# Patient Record
Sex: Female | Born: 1975 | Race: White | Hispanic: No | Marital: Married | Smoking: Current every day smoker
Health system: Southern US, Community
[De-identification: ages and names within clinical notes are randomized; demographics above are authoritative.]

## PROBLEM LIST (undated history)

## (undated) DIAGNOSIS — M069 Rheumatoid arthritis, unspecified: Secondary | ICD-10-CM

## (undated) DIAGNOSIS — F32A Depression, unspecified: Secondary | ICD-10-CM

## (undated) DIAGNOSIS — M8580 Other specified disorders of bone density and structure, unspecified site: Secondary | ICD-10-CM

## (undated) DIAGNOSIS — Z87442 Personal history of urinary calculi: Secondary | ICD-10-CM

## (undated) DIAGNOSIS — K5792 Diverticulitis of intestine, part unspecified, without perforation or abscess without bleeding: Secondary | ICD-10-CM

## (undated) DIAGNOSIS — J9610 Chronic respiratory failure, unspecified whether with hypoxia or hypercapnia: Secondary | ICD-10-CM

## (undated) DIAGNOSIS — F319 Bipolar disorder, unspecified: Secondary | ICD-10-CM

## (undated) DIAGNOSIS — Z9981 Dependence on supplemental oxygen: Secondary | ICD-10-CM

## (undated) DIAGNOSIS — J449 Chronic obstructive pulmonary disease, unspecified: Secondary | ICD-10-CM

## (undated) DIAGNOSIS — K760 Fatty (change of) liver, not elsewhere classified: Secondary | ICD-10-CM

## (undated) DIAGNOSIS — K219 Gastro-esophageal reflux disease without esophagitis: Secondary | ICD-10-CM

## (undated) DIAGNOSIS — F419 Anxiety disorder, unspecified: Secondary | ICD-10-CM

## (undated) DIAGNOSIS — E119 Type 2 diabetes mellitus without complications: Secondary | ICD-10-CM

## (undated) DIAGNOSIS — K567 Ileus, unspecified: Secondary | ICD-10-CM

## (undated) DIAGNOSIS — E785 Hyperlipidemia, unspecified: Secondary | ICD-10-CM

## (undated) DIAGNOSIS — F329 Major depressive disorder, single episode, unspecified: Secondary | ICD-10-CM

## (undated) DIAGNOSIS — R0600 Dyspnea, unspecified: Secondary | ICD-10-CM

## (undated) DIAGNOSIS — R945 Abnormal results of liver function studies: Secondary | ICD-10-CM

## (undated) DIAGNOSIS — I10 Essential (primary) hypertension: Secondary | ICD-10-CM

## (undated) DIAGNOSIS — M797 Fibromyalgia: Secondary | ICD-10-CM

## (undated) DIAGNOSIS — R7989 Other specified abnormal findings of blood chemistry: Secondary | ICD-10-CM

## (undated) DIAGNOSIS — C539 Malignant neoplasm of cervix uteri, unspecified: Secondary | ICD-10-CM

## (undated) HISTORY — DX: Chronic respiratory failure, unspecified whether with hypoxia or hypercapnia: J96.10

## (undated) HISTORY — DX: Rheumatoid arthritis, unspecified: M06.9

## (undated) HISTORY — DX: Ileus, unspecified: K56.7

## (undated) HISTORY — DX: Depression, unspecified: F32.A

## (undated) HISTORY — DX: Fatty (change of) liver, not elsewhere classified: K76.0

## (undated) HISTORY — DX: Anxiety disorder, unspecified: F41.9

## (undated) HISTORY — DX: Abnormal results of liver function studies: R94.5

## (undated) HISTORY — DX: Diverticulitis of intestine, part unspecified, without perforation or abscess without bleeding: K57.92

## (undated) HISTORY — DX: Other specified abnormal findings of blood chemistry: R79.89

## (undated) HISTORY — DX: Dependence on supplemental oxygen: Z99.81

## (undated) HISTORY — DX: Gastro-esophageal reflux disease without esophagitis: K21.9

## (undated) HISTORY — DX: Hyperlipidemia, unspecified: E78.5

## (undated) HISTORY — DX: Fibromyalgia: M79.7

## (undated) HISTORY — DX: Bipolar disorder, unspecified: F31.9

## (undated) HISTORY — PX: BREAST LUMPECTOMY: SHX2

## (undated) HISTORY — PX: APPENDECTOMY: SHX54

## (undated) HISTORY — DX: Major depressive disorder, single episode, unspecified: F32.9

## (undated) HISTORY — PX: TOTAL ABDOMINAL HYSTERECTOMY W/ BILATERAL SALPINGOOPHORECTOMY: SHX83

## (undated) HISTORY — PX: CHOLECYSTECTOMY: SHX55

---

## 2013-09-29 DIAGNOSIS — F489 Nonpsychotic mental disorder, unspecified: Secondary | ICD-10-CM | POA: Diagnosis present

## 2013-09-29 DIAGNOSIS — Z8542 Personal history of malignant neoplasm of other parts of uterus: Secondary | ICD-10-CM | POA: Diagnosis not present

## 2013-09-29 DIAGNOSIS — F17200 Nicotine dependence, unspecified, uncomplicated: Secondary | ICD-10-CM | POA: Diagnosis present

## 2013-09-29 DIAGNOSIS — F331 Major depressive disorder, recurrent, moderate: Secondary | ICD-10-CM | POA: Diagnosis not present

## 2013-09-29 DIAGNOSIS — M1990 Unspecified osteoarthritis, unspecified site: Secondary | ICD-10-CM | POA: Diagnosis present

## 2013-09-29 DIAGNOSIS — Z8541 Personal history of malignant neoplasm of cervix uteri: Secondary | ICD-10-CM | POA: Diagnosis not present

## 2013-09-29 DIAGNOSIS — F329 Major depressive disorder, single episode, unspecified: Secondary | ICD-10-CM | POA: Diagnosis not present

## 2013-09-29 DIAGNOSIS — F3110 Bipolar disorder, current episode manic without psychotic features, unspecified: Secondary | ICD-10-CM | POA: Diagnosis present

## 2013-09-29 DIAGNOSIS — IMO0001 Reserved for inherently not codable concepts without codable children: Secondary | ICD-10-CM | POA: Diagnosis present

## 2013-09-29 DIAGNOSIS — M069 Rheumatoid arthritis, unspecified: Secondary | ICD-10-CM | POA: Diagnosis present

## 2013-09-29 DIAGNOSIS — Z853 Personal history of malignant neoplasm of breast: Secondary | ICD-10-CM | POA: Diagnosis not present

## 2013-12-13 DIAGNOSIS — E039 Hypothyroidism, unspecified: Secondary | ICD-10-CM | POA: Diagnosis not present

## 2013-12-13 DIAGNOSIS — E559 Vitamin D deficiency, unspecified: Secondary | ICD-10-CM | POA: Diagnosis not present

## 2013-12-13 DIAGNOSIS — M2550 Pain in unspecified joint: Secondary | ICD-10-CM | POA: Diagnosis not present

## 2013-12-13 DIAGNOSIS — M129 Arthropathy, unspecified: Secondary | ICD-10-CM | POA: Diagnosis not present

## 2013-12-13 DIAGNOSIS — R5381 Other malaise: Secondary | ICD-10-CM | POA: Diagnosis not present

## 2014-02-05 DIAGNOSIS — G4730 Sleep apnea, unspecified: Secondary | ICD-10-CM | POA: Diagnosis not present

## 2014-02-05 DIAGNOSIS — E559 Vitamin D deficiency, unspecified: Secondary | ICD-10-CM | POA: Diagnosis not present

## 2014-02-05 DIAGNOSIS — M069 Rheumatoid arthritis, unspecified: Secondary | ICD-10-CM | POA: Diagnosis not present

## 2014-02-05 DIAGNOSIS — G5600 Carpal tunnel syndrome, unspecified upper limb: Secondary | ICD-10-CM | POA: Diagnosis not present

## 2014-02-05 DIAGNOSIS — R7301 Impaired fasting glucose: Secondary | ICD-10-CM | POA: Diagnosis not present

## 2014-02-05 DIAGNOSIS — F3130 Bipolar disorder, current episode depressed, mild or moderate severity, unspecified: Secondary | ICD-10-CM | POA: Diagnosis not present

## 2014-02-05 DIAGNOSIS — R209 Unspecified disturbances of skin sensation: Secondary | ICD-10-CM | POA: Diagnosis not present

## 2014-02-05 DIAGNOSIS — R5381 Other malaise: Secondary | ICD-10-CM | POA: Diagnosis not present

## 2014-02-05 DIAGNOSIS — E7800 Pure hypercholesterolemia, unspecified: Secondary | ICD-10-CM | POA: Diagnosis not present

## 2014-02-05 DIAGNOSIS — G43109 Migraine with aura, not intractable, without status migrainosus: Secondary | ICD-10-CM | POA: Diagnosis not present

## 2014-02-05 DIAGNOSIS — R7309 Other abnormal glucose: Secondary | ICD-10-CM | POA: Diagnosis not present

## 2014-02-05 DIAGNOSIS — E782 Mixed hyperlipidemia: Secondary | ICD-10-CM | POA: Diagnosis not present

## 2014-02-05 DIAGNOSIS — R5383 Other fatigue: Secondary | ICD-10-CM | POA: Diagnosis not present

## 2014-02-25 DIAGNOSIS — Z853 Personal history of malignant neoplasm of breast: Secondary | ICD-10-CM | POA: Diagnosis not present

## 2014-02-25 DIAGNOSIS — F319 Bipolar disorder, unspecified: Secondary | ICD-10-CM | POA: Diagnosis not present

## 2014-02-25 DIAGNOSIS — IMO0001 Reserved for inherently not codable concepts without codable children: Secondary | ICD-10-CM | POA: Diagnosis not present

## 2014-02-25 DIAGNOSIS — S79929A Unspecified injury of unspecified thigh, initial encounter: Secondary | ICD-10-CM | POA: Diagnosis not present

## 2014-02-25 DIAGNOSIS — S79919A Unspecified injury of unspecified hip, initial encounter: Secondary | ICD-10-CM | POA: Diagnosis not present

## 2014-02-25 DIAGNOSIS — F17200 Nicotine dependence, unspecified, uncomplicated: Secondary | ICD-10-CM | POA: Diagnosis not present

## 2014-02-25 DIAGNOSIS — Z79899 Other long term (current) drug therapy: Secondary | ICD-10-CM | POA: Diagnosis not present

## 2014-02-25 DIAGNOSIS — S7000XA Contusion of unspecified hip, initial encounter: Secondary | ICD-10-CM | POA: Diagnosis not present

## 2014-03-03 DIAGNOSIS — Z791 Long term (current) use of non-steroidal anti-inflammatories (NSAID): Secondary | ICD-10-CM | POA: Diagnosis not present

## 2014-03-03 DIAGNOSIS — T781XXA Other adverse food reactions, not elsewhere classified, initial encounter: Secondary | ICD-10-CM | POA: Diagnosis not present

## 2014-03-03 DIAGNOSIS — Z79899 Other long term (current) drug therapy: Secondary | ICD-10-CM | POA: Diagnosis not present

## 2014-03-03 DIAGNOSIS — R195 Other fecal abnormalities: Secondary | ICD-10-CM | POA: Diagnosis not present

## 2014-03-03 DIAGNOSIS — IMO0001 Reserved for inherently not codable concepts without codable children: Secondary | ICD-10-CM | POA: Diagnosis not present

## 2014-03-03 DIAGNOSIS — F17200 Nicotine dependence, unspecified, uncomplicated: Secondary | ICD-10-CM | POA: Diagnosis not present

## 2014-03-03 DIAGNOSIS — M069 Rheumatoid arthritis, unspecified: Secondary | ICD-10-CM | POA: Diagnosis not present

## 2014-03-03 DIAGNOSIS — F319 Bipolar disorder, unspecified: Secondary | ICD-10-CM | POA: Diagnosis not present

## 2014-04-24 DIAGNOSIS — S0085XA Superficial foreign body of other part of head, initial encounter: Secondary | ICD-10-CM | POA: Diagnosis not present

## 2014-04-24 DIAGNOSIS — M069 Rheumatoid arthritis, unspecified: Secondary | ICD-10-CM | POA: Diagnosis not present

## 2014-04-24 DIAGNOSIS — G4730 Sleep apnea, unspecified: Secondary | ICD-10-CM | POA: Diagnosis not present

## 2014-04-24 DIAGNOSIS — S0005XA Superficial foreign body of scalp, initial encounter: Secondary | ICD-10-CM | POA: Diagnosis not present

## 2014-04-24 DIAGNOSIS — G43109 Migraine with aura, not intractable, without status migrainosus: Secondary | ICD-10-CM | POA: Diagnosis not present

## 2014-04-24 DIAGNOSIS — IMO0001 Reserved for inherently not codable concepts without codable children: Secondary | ICD-10-CM | POA: Diagnosis not present

## 2014-04-24 DIAGNOSIS — F3130 Bipolar disorder, current episode depressed, mild or moderate severity, unspecified: Secondary | ICD-10-CM | POA: Diagnosis not present

## 2014-04-24 DIAGNOSIS — G5600 Carpal tunnel syndrome, unspecified upper limb: Secondary | ICD-10-CM | POA: Diagnosis not present

## 2014-04-24 DIAGNOSIS — K219 Gastro-esophageal reflux disease without esophagitis: Secondary | ICD-10-CM | POA: Diagnosis not present

## 2014-04-24 DIAGNOSIS — Z23 Encounter for immunization: Secondary | ICD-10-CM | POA: Diagnosis not present

## 2014-05-20 DIAGNOSIS — K1370 Unspecified lesions of oral mucosa: Secondary | ICD-10-CM | POA: Diagnosis not present

## 2014-06-05 DIAGNOSIS — R42 Dizziness and giddiness: Secondary | ICD-10-CM | POA: Diagnosis not present

## 2014-06-05 DIAGNOSIS — R0682 Tachypnea, not elsewhere classified: Secondary | ICD-10-CM | POA: Diagnosis not present

## 2014-06-05 DIAGNOSIS — R404 Transient alteration of awareness: Secondary | ICD-10-CM | POA: Diagnosis not present

## 2014-06-16 DIAGNOSIS — F3160 Bipolar disorder, current episode mixed, unspecified: Secondary | ICD-10-CM | POA: Diagnosis not present

## 2014-06-16 DIAGNOSIS — F319 Bipolar disorder, unspecified: Secondary | ICD-10-CM | POA: Diagnosis not present

## 2014-06-16 DIAGNOSIS — IMO0001 Reserved for inherently not codable concepts without codable children: Secondary | ICD-10-CM | POA: Diagnosis not present

## 2014-06-16 DIAGNOSIS — F489 Nonpsychotic mental disorder, unspecified: Secondary | ICD-10-CM | POA: Diagnosis not present

## 2014-06-16 DIAGNOSIS — F329 Major depressive disorder, single episode, unspecified: Secondary | ICD-10-CM | POA: Diagnosis not present

## 2014-06-16 DIAGNOSIS — R45851 Suicidal ideations: Secondary | ICD-10-CM | POA: Diagnosis not present

## 2014-06-16 DIAGNOSIS — M069 Rheumatoid arthritis, unspecified: Secondary | ICD-10-CM | POA: Diagnosis not present

## 2014-06-16 DIAGNOSIS — Z008 Encounter for other general examination: Secondary | ICD-10-CM | POA: Diagnosis not present

## 2014-06-20 DIAGNOSIS — F4310 Post-traumatic stress disorder, unspecified: Secondary | ICD-10-CM | POA: Diagnosis not present

## 2014-06-23 DIAGNOSIS — F319 Bipolar disorder, unspecified: Secondary | ICD-10-CM | POA: Diagnosis not present

## 2014-07-03 DIAGNOSIS — R7309 Other abnormal glucose: Secondary | ICD-10-CM | POA: Diagnosis not present

## 2014-07-03 DIAGNOSIS — Z0000 Encounter for general adult medical examination without abnormal findings: Secondary | ICD-10-CM | POA: Diagnosis not present

## 2014-07-03 DIAGNOSIS — Z01419 Encounter for gynecological examination (general) (routine) without abnormal findings: Secondary | ICD-10-CM | POA: Diagnosis not present

## 2014-07-03 DIAGNOSIS — IMO0002 Reserved for concepts with insufficient information to code with codable children: Secondary | ICD-10-CM | POA: Diagnosis not present

## 2014-07-03 DIAGNOSIS — E782 Mixed hyperlipidemia: Secondary | ICD-10-CM | POA: Diagnosis not present

## 2014-07-22 DIAGNOSIS — N920 Excessive and frequent menstruation with regular cycle: Secondary | ICD-10-CM | POA: Diagnosis not present

## 2014-07-22 DIAGNOSIS — N921 Excessive and frequent menstruation with irregular cycle: Secondary | ICD-10-CM | POA: Diagnosis not present

## 2014-07-23 DIAGNOSIS — H43399 Other vitreous opacities, unspecified eye: Secondary | ICD-10-CM | POA: Diagnosis not present

## 2014-07-23 DIAGNOSIS — H16109 Unspecified superficial keratitis, unspecified eye: Secondary | ICD-10-CM | POA: Diagnosis not present

## 2014-07-23 DIAGNOSIS — H52229 Regular astigmatism, unspecified eye: Secondary | ICD-10-CM | POA: Diagnosis not present

## 2014-07-23 DIAGNOSIS — H5210 Myopia, unspecified eye: Secondary | ICD-10-CM | POA: Diagnosis not present

## 2014-08-05 DIAGNOSIS — N920 Excessive and frequent menstruation with regular cycle: Secondary | ICD-10-CM | POA: Diagnosis not present

## 2014-08-06 DIAGNOSIS — F319 Bipolar disorder, unspecified: Secondary | ICD-10-CM | POA: Diagnosis not present

## 2014-09-10 DIAGNOSIS — J069 Acute upper respiratory infection, unspecified: Secondary | ICD-10-CM | POA: Diagnosis not present

## 2014-09-10 DIAGNOSIS — J9801 Acute bronchospasm: Secondary | ICD-10-CM | POA: Diagnosis not present

## 2014-09-11 DIAGNOSIS — M797 Fibromyalgia: Secondary | ICD-10-CM | POA: Diagnosis not present

## 2014-09-11 DIAGNOSIS — R0602 Shortness of breath: Secondary | ICD-10-CM | POA: Diagnosis not present

## 2014-09-11 DIAGNOSIS — R05 Cough: Secondary | ICD-10-CM | POA: Diagnosis not present

## 2014-09-11 DIAGNOSIS — Z7951 Long term (current) use of inhaled steroids: Secondary | ICD-10-CM | POA: Diagnosis not present

## 2014-09-11 DIAGNOSIS — J209 Acute bronchitis, unspecified: Secondary | ICD-10-CM | POA: Diagnosis not present

## 2014-09-11 DIAGNOSIS — R062 Wheezing: Secondary | ICD-10-CM | POA: Diagnosis not present

## 2014-09-11 DIAGNOSIS — Z79899 Other long term (current) drug therapy: Secondary | ICD-10-CM | POA: Diagnosis not present

## 2014-09-11 DIAGNOSIS — Z720 Tobacco use: Secondary | ICD-10-CM | POA: Diagnosis not present

## 2014-09-11 DIAGNOSIS — R030 Elevated blood-pressure reading, without diagnosis of hypertension: Secondary | ICD-10-CM | POA: Diagnosis not present

## 2014-09-11 DIAGNOSIS — Z791 Long term (current) use of non-steroidal anti-inflammatories (NSAID): Secondary | ICD-10-CM | POA: Diagnosis not present

## 2014-10-09 DIAGNOSIS — F4310 Post-traumatic stress disorder, unspecified: Secondary | ICD-10-CM | POA: Diagnosis not present

## 2014-10-15 DIAGNOSIS — N921 Excessive and frequent menstruation with irregular cycle: Secondary | ICD-10-CM | POA: Diagnosis not present

## 2014-10-20 DIAGNOSIS — K219 Gastro-esophageal reflux disease without esophagitis: Secondary | ICD-10-CM | POA: Diagnosis not present

## 2014-10-20 DIAGNOSIS — Z23 Encounter for immunization: Secondary | ICD-10-CM | POA: Diagnosis not present

## 2014-10-20 DIAGNOSIS — M069 Rheumatoid arthritis, unspecified: Secondary | ICD-10-CM | POA: Diagnosis not present

## 2014-10-20 DIAGNOSIS — F3131 Bipolar disorder, current episode depressed, mild: Secondary | ICD-10-CM | POA: Diagnosis not present

## 2014-10-20 DIAGNOSIS — E782 Mixed hyperlipidemia: Secondary | ICD-10-CM | POA: Diagnosis not present

## 2014-10-20 DIAGNOSIS — E559 Vitamin D deficiency, unspecified: Secondary | ICD-10-CM | POA: Diagnosis not present

## 2014-10-28 DIAGNOSIS — N921 Excessive and frequent menstruation with irregular cycle: Secondary | ICD-10-CM | POA: Diagnosis not present

## 2014-10-28 DIAGNOSIS — N920 Excessive and frequent menstruation with regular cycle: Secondary | ICD-10-CM | POA: Diagnosis not present

## 2014-10-28 DIAGNOSIS — Z79899 Other long term (current) drug therapy: Secondary | ICD-10-CM | POA: Diagnosis not present

## 2014-10-28 DIAGNOSIS — F17210 Nicotine dependence, cigarettes, uncomplicated: Secondary | ICD-10-CM | POA: Diagnosis not present

## 2014-10-28 DIAGNOSIS — N800 Endometriosis of uterus: Secondary | ICD-10-CM | POA: Diagnosis not present

## 2014-10-28 DIAGNOSIS — F329 Major depressive disorder, single episode, unspecified: Secondary | ICD-10-CM | POA: Diagnosis not present

## 2014-10-28 DIAGNOSIS — K219 Gastro-esophageal reflux disease without esophagitis: Secondary | ICD-10-CM | POA: Diagnosis not present

## 2014-12-31 DIAGNOSIS — F319 Bipolar disorder, unspecified: Secondary | ICD-10-CM | POA: Diagnosis not present

## 2014-12-31 DIAGNOSIS — S61411A Laceration without foreign body of right hand, initial encounter: Secondary | ICD-10-CM | POA: Diagnosis not present

## 2015-01-26 DIAGNOSIS — M797 Fibromyalgia: Secondary | ICD-10-CM | POA: Diagnosis not present

## 2015-01-26 DIAGNOSIS — M069 Rheumatoid arthritis, unspecified: Secondary | ICD-10-CM | POA: Diagnosis not present

## 2015-01-26 DIAGNOSIS — F3131 Bipolar disorder, current episode depressed, mild: Secondary | ICD-10-CM | POA: Diagnosis not present

## 2015-01-26 DIAGNOSIS — E559 Vitamin D deficiency, unspecified: Secondary | ICD-10-CM | POA: Diagnosis not present

## 2015-01-26 DIAGNOSIS — K219 Gastro-esophageal reflux disease without esophagitis: Secondary | ICD-10-CM | POA: Diagnosis not present

## 2015-01-26 DIAGNOSIS — I10 Essential (primary) hypertension: Secondary | ICD-10-CM | POA: Diagnosis not present

## 2015-01-26 DIAGNOSIS — E782 Mixed hyperlipidemia: Secondary | ICD-10-CM | POA: Diagnosis not present

## 2015-01-26 DIAGNOSIS — R7301 Impaired fasting glucose: Secondary | ICD-10-CM | POA: Diagnosis not present

## 2015-02-07 DIAGNOSIS — F319 Bipolar disorder, unspecified: Secondary | ICD-10-CM | POA: Diagnosis not present

## 2015-02-07 DIAGNOSIS — R45851 Suicidal ideations: Secondary | ICD-10-CM | POA: Diagnosis not present

## 2015-02-07 DIAGNOSIS — R259 Unspecified abnormal involuntary movements: Secondary | ICD-10-CM | POA: Diagnosis not present

## 2015-02-07 DIAGNOSIS — M797 Fibromyalgia: Secondary | ICD-10-CM | POA: Diagnosis not present

## 2015-02-07 DIAGNOSIS — Z79899 Other long term (current) drug therapy: Secondary | ICD-10-CM | POA: Diagnosis not present

## 2015-02-07 DIAGNOSIS — M1990 Unspecified osteoarthritis, unspecified site: Secondary | ICD-10-CM | POA: Diagnosis not present

## 2015-02-08 DIAGNOSIS — R45851 Suicidal ideations: Secondary | ICD-10-CM | POA: Diagnosis present

## 2015-02-08 DIAGNOSIS — M797 Fibromyalgia: Secondary | ICD-10-CM | POA: Diagnosis present

## 2015-02-08 DIAGNOSIS — M069 Rheumatoid arthritis, unspecified: Secondary | ICD-10-CM | POA: Diagnosis present

## 2015-02-08 DIAGNOSIS — K219 Gastro-esophageal reflux disease without esophagitis: Secondary | ICD-10-CM | POA: Diagnosis present

## 2015-02-08 DIAGNOSIS — F319 Bipolar disorder, unspecified: Secondary | ICD-10-CM | POA: Diagnosis not present

## 2015-02-08 DIAGNOSIS — I10 Essential (primary) hypertension: Secondary | ICD-10-CM | POA: Diagnosis present

## 2015-02-08 DIAGNOSIS — E785 Hyperlipidemia, unspecified: Secondary | ICD-10-CM | POA: Diagnosis present

## 2015-02-08 DIAGNOSIS — F314 Bipolar disorder, current episode depressed, severe, without psychotic features: Secondary | ICD-10-CM | POA: Diagnosis present

## 2015-02-19 DIAGNOSIS — F319 Bipolar disorder, unspecified: Secondary | ICD-10-CM | POA: Diagnosis not present

## 2015-02-20 DIAGNOSIS — I10 Essential (primary) hypertension: Secondary | ICD-10-CM | POA: Diagnosis not present

## 2015-02-20 DIAGNOSIS — R740 Nonspecific elevation of levels of transaminase and lactic acid dehydrogenase [LDH]: Secondary | ICD-10-CM | POA: Diagnosis not present

## 2015-02-20 DIAGNOSIS — M069 Rheumatoid arthritis, unspecified: Secondary | ICD-10-CM | POA: Diagnosis not present

## 2015-03-05 DIAGNOSIS — F319 Bipolar disorder, unspecified: Secondary | ICD-10-CM | POA: Diagnosis not present

## 2015-03-12 DIAGNOSIS — R29898 Other symptoms and signs involving the musculoskeletal system: Secondary | ICD-10-CM | POA: Diagnosis not present

## 2015-03-12 DIAGNOSIS — M542 Cervicalgia: Secondary | ICD-10-CM | POA: Diagnosis not present

## 2015-03-12 DIAGNOSIS — R531 Weakness: Secondary | ICD-10-CM | POA: Diagnosis not present

## 2015-03-12 DIAGNOSIS — I10 Essential (primary) hypertension: Secondary | ICD-10-CM | POA: Diagnosis not present

## 2015-03-12 DIAGNOSIS — R262 Difficulty in walking, not elsewhere classified: Secondary | ICD-10-CM | POA: Diagnosis not present

## 2015-03-12 DIAGNOSIS — R51 Headache: Secondary | ICD-10-CM | POA: Diagnosis not present

## 2015-03-12 DIAGNOSIS — R269 Unspecified abnormalities of gait and mobility: Secondary | ICD-10-CM | POA: Diagnosis not present

## 2015-03-12 DIAGNOSIS — R200 Anesthesia of skin: Secondary | ICD-10-CM | POA: Diagnosis not present

## 2015-03-12 DIAGNOSIS — I639 Cerebral infarction, unspecified: Secondary | ICD-10-CM | POA: Diagnosis not present

## 2015-03-12 DIAGNOSIS — R202 Paresthesia of skin: Secondary | ICD-10-CM | POA: Diagnosis not present

## 2015-03-14 DIAGNOSIS — R030 Elevated blood-pressure reading, without diagnosis of hypertension: Secondary | ICD-10-CM | POA: Diagnosis not present

## 2015-03-14 DIAGNOSIS — G501 Atypical facial pain: Secondary | ICD-10-CM | POA: Diagnosis not present

## 2015-03-14 DIAGNOSIS — Z79899 Other long term (current) drug therapy: Secondary | ICD-10-CM | POA: Diagnosis not present

## 2015-03-14 DIAGNOSIS — R202 Paresthesia of skin: Secondary | ICD-10-CM | POA: Diagnosis not present

## 2015-03-14 DIAGNOSIS — I639 Cerebral infarction, unspecified: Secondary | ICD-10-CM | POA: Diagnosis not present

## 2015-03-14 DIAGNOSIS — R51 Headache: Secondary | ICD-10-CM | POA: Diagnosis not present

## 2015-03-14 DIAGNOSIS — R531 Weakness: Secondary | ICD-10-CM | POA: Diagnosis not present

## 2015-03-14 DIAGNOSIS — M79602 Pain in left arm: Secondary | ICD-10-CM | POA: Diagnosis not present

## 2015-03-14 DIAGNOSIS — Z720 Tobacco use: Secondary | ICD-10-CM | POA: Diagnosis not present

## 2015-03-17 DIAGNOSIS — I10 Essential (primary) hypertension: Secondary | ICD-10-CM | POA: Diagnosis not present

## 2015-03-17 DIAGNOSIS — R29898 Other symptoms and signs involving the musculoskeletal system: Secondary | ICD-10-CM | POA: Diagnosis not present

## 2015-03-17 DIAGNOSIS — R202 Paresthesia of skin: Secondary | ICD-10-CM | POA: Diagnosis not present

## 2015-03-18 DIAGNOSIS — I10 Essential (primary) hypertension: Secondary | ICD-10-CM | POA: Diagnosis not present

## 2015-03-19 DIAGNOSIS — F319 Bipolar disorder, unspecified: Secondary | ICD-10-CM | POA: Diagnosis not present

## 2015-03-26 DIAGNOSIS — R200 Anesthesia of skin: Secondary | ICD-10-CM | POA: Diagnosis not present

## 2015-03-26 DIAGNOSIS — R29898 Other symptoms and signs involving the musculoskeletal system: Secondary | ICD-10-CM | POA: Diagnosis not present

## 2015-03-26 DIAGNOSIS — G930 Cerebral cysts: Secondary | ICD-10-CM | POA: Diagnosis not present

## 2015-04-09 DIAGNOSIS — M2550 Pain in unspecified joint: Secondary | ICD-10-CM | POA: Diagnosis not present

## 2015-04-09 DIAGNOSIS — F17210 Nicotine dependence, cigarettes, uncomplicated: Secondary | ICD-10-CM | POA: Diagnosis not present

## 2015-04-09 DIAGNOSIS — R760 Raised antibody titer: Secondary | ICD-10-CM | POA: Diagnosis not present

## 2015-04-09 DIAGNOSIS — M79641 Pain in right hand: Secondary | ICD-10-CM | POA: Diagnosis not present

## 2015-04-09 DIAGNOSIS — M79642 Pain in left hand: Secondary | ICD-10-CM | POA: Diagnosis not present

## 2015-04-09 DIAGNOSIS — Z888 Allergy status to other drugs, medicaments and biological substances status: Secondary | ICD-10-CM | POA: Diagnosis not present

## 2015-04-09 DIAGNOSIS — Z7982 Long term (current) use of aspirin: Secondary | ICD-10-CM | POA: Diagnosis not present

## 2015-04-09 DIAGNOSIS — M069 Rheumatoid arthritis, unspecified: Secondary | ICD-10-CM | POA: Diagnosis not present

## 2015-04-09 DIAGNOSIS — E780 Pure hypercholesterolemia: Secondary | ICD-10-CM | POA: Diagnosis not present

## 2015-04-09 DIAGNOSIS — R768 Other specified abnormal immunological findings in serum: Secondary | ICD-10-CM | POA: Diagnosis not present

## 2015-04-09 DIAGNOSIS — I10 Essential (primary) hypertension: Secondary | ICD-10-CM | POA: Diagnosis not present

## 2015-04-30 DIAGNOSIS — F319 Bipolar disorder, unspecified: Secondary | ICD-10-CM | POA: Diagnosis not present

## 2015-05-05 DIAGNOSIS — M797 Fibromyalgia: Secondary | ICD-10-CM | POA: Diagnosis not present

## 2015-05-05 DIAGNOSIS — E782 Mixed hyperlipidemia: Secondary | ICD-10-CM | POA: Diagnosis not present

## 2015-05-05 DIAGNOSIS — K219 Gastro-esophageal reflux disease without esophagitis: Secondary | ICD-10-CM | POA: Diagnosis not present

## 2015-05-05 DIAGNOSIS — M25511 Pain in right shoulder: Secondary | ICD-10-CM | POA: Diagnosis not present

## 2015-05-05 DIAGNOSIS — F3131 Bipolar disorder, current episode depressed, mild: Secondary | ICD-10-CM | POA: Diagnosis not present

## 2015-05-05 DIAGNOSIS — R7301 Impaired fasting glucose: Secondary | ICD-10-CM | POA: Diagnosis not present

## 2015-05-05 DIAGNOSIS — E780 Pure hypercholesterolemia: Secondary | ICD-10-CM | POA: Diagnosis not present

## 2015-05-05 DIAGNOSIS — M069 Rheumatoid arthritis, unspecified: Secondary | ICD-10-CM | POA: Diagnosis not present

## 2015-05-05 DIAGNOSIS — I10 Essential (primary) hypertension: Secondary | ICD-10-CM | POA: Diagnosis not present

## 2015-05-05 DIAGNOSIS — E559 Vitamin D deficiency, unspecified: Secondary | ICD-10-CM | POA: Diagnosis not present

## 2015-05-06 DIAGNOSIS — D72829 Elevated white blood cell count, unspecified: Secondary | ICD-10-CM | POA: Diagnosis not present

## 2015-05-06 DIAGNOSIS — M7541 Impingement syndrome of right shoulder: Secondary | ICD-10-CM | POA: Diagnosis not present

## 2015-05-27 DIAGNOSIS — R740 Nonspecific elevation of levels of transaminase and lactic acid dehydrogenase [LDH]: Secondary | ICD-10-CM | POA: Diagnosis not present

## 2015-05-27 DIAGNOSIS — D72829 Elevated white blood cell count, unspecified: Secondary | ICD-10-CM | POA: Diagnosis not present

## 2015-06-05 DIAGNOSIS — R269 Unspecified abnormalities of gait and mobility: Secondary | ICD-10-CM | POA: Diagnosis not present

## 2015-06-05 DIAGNOSIS — M62551 Muscle wasting and atrophy, not elsewhere classified, right thigh: Secondary | ICD-10-CM | POA: Diagnosis not present

## 2015-06-05 DIAGNOSIS — M62552 Muscle wasting and atrophy, not elsewhere classified, left thigh: Secondary | ICD-10-CM | POA: Diagnosis not present

## 2015-06-05 DIAGNOSIS — M545 Low back pain: Secondary | ICD-10-CM | POA: Diagnosis not present

## 2015-06-09 DIAGNOSIS — M62551 Muscle wasting and atrophy, not elsewhere classified, right thigh: Secondary | ICD-10-CM | POA: Diagnosis not present

## 2015-06-09 DIAGNOSIS — M62552 Muscle wasting and atrophy, not elsewhere classified, left thigh: Secondary | ICD-10-CM | POA: Diagnosis not present

## 2015-06-09 DIAGNOSIS — M545 Low back pain: Secondary | ICD-10-CM | POA: Diagnosis not present

## 2015-06-09 DIAGNOSIS — R269 Unspecified abnormalities of gait and mobility: Secondary | ICD-10-CM | POA: Diagnosis not present

## 2015-06-11 DIAGNOSIS — M62551 Muscle wasting and atrophy, not elsewhere classified, right thigh: Secondary | ICD-10-CM | POA: Diagnosis not present

## 2015-06-11 DIAGNOSIS — R269 Unspecified abnormalities of gait and mobility: Secondary | ICD-10-CM | POA: Diagnosis not present

## 2015-06-11 DIAGNOSIS — M545 Low back pain: Secondary | ICD-10-CM | POA: Diagnosis not present

## 2015-06-11 DIAGNOSIS — M62552 Muscle wasting and atrophy, not elsewhere classified, left thigh: Secondary | ICD-10-CM | POA: Diagnosis not present

## 2015-06-16 DIAGNOSIS — M62551 Muscle wasting and atrophy, not elsewhere classified, right thigh: Secondary | ICD-10-CM | POA: Diagnosis not present

## 2015-06-16 DIAGNOSIS — M545 Low back pain: Secondary | ICD-10-CM | POA: Diagnosis not present

## 2015-06-16 DIAGNOSIS — R269 Unspecified abnormalities of gait and mobility: Secondary | ICD-10-CM | POA: Diagnosis not present

## 2015-06-16 DIAGNOSIS — M62552 Muscle wasting and atrophy, not elsewhere classified, left thigh: Secondary | ICD-10-CM | POA: Diagnosis not present

## 2015-06-25 DIAGNOSIS — F319 Bipolar disorder, unspecified: Secondary | ICD-10-CM | POA: Diagnosis not present

## 2015-07-15 DIAGNOSIS — F17210 Nicotine dependence, cigarettes, uncomplicated: Secondary | ICD-10-CM | POA: Diagnosis not present

## 2015-07-15 DIAGNOSIS — M1990 Unspecified osteoarthritis, unspecified site: Secondary | ICD-10-CM | POA: Diagnosis not present

## 2015-07-15 DIAGNOSIS — Z888 Allergy status to other drugs, medicaments and biological substances status: Secondary | ICD-10-CM | POA: Diagnosis not present

## 2015-07-15 DIAGNOSIS — Z7952 Long term (current) use of systemic steroids: Secondary | ICD-10-CM | POA: Diagnosis not present

## 2015-07-15 DIAGNOSIS — M069 Rheumatoid arthritis, unspecified: Secondary | ICD-10-CM | POA: Diagnosis not present

## 2015-07-15 DIAGNOSIS — Z79899 Other long term (current) drug therapy: Secondary | ICD-10-CM | POA: Diagnosis not present

## 2015-07-30 DIAGNOSIS — E559 Vitamin D deficiency, unspecified: Secondary | ICD-10-CM | POA: Diagnosis not present

## 2015-07-30 DIAGNOSIS — M797 Fibromyalgia: Secondary | ICD-10-CM | POA: Diagnosis not present

## 2015-07-30 DIAGNOSIS — F3131 Bipolar disorder, current episode depressed, mild: Secondary | ICD-10-CM | POA: Diagnosis not present

## 2015-07-30 DIAGNOSIS — E782 Mixed hyperlipidemia: Secondary | ICD-10-CM | POA: Diagnosis not present

## 2015-07-30 DIAGNOSIS — K219 Gastro-esophageal reflux disease without esophagitis: Secondary | ICD-10-CM | POA: Diagnosis not present

## 2015-07-30 DIAGNOSIS — E780 Pure hypercholesterolemia: Secondary | ICD-10-CM | POA: Diagnosis not present

## 2015-07-30 DIAGNOSIS — R7301 Impaired fasting glucose: Secondary | ICD-10-CM | POA: Diagnosis not present

## 2015-07-30 DIAGNOSIS — M069 Rheumatoid arthritis, unspecified: Secondary | ICD-10-CM | POA: Diagnosis not present

## 2015-07-30 DIAGNOSIS — I10 Essential (primary) hypertension: Secondary | ICD-10-CM | POA: Diagnosis not present

## 2015-08-20 DIAGNOSIS — F319 Bipolar disorder, unspecified: Secondary | ICD-10-CM | POA: Diagnosis not present

## 2015-08-25 DIAGNOSIS — Z0000 Encounter for general adult medical examination without abnormal findings: Secondary | ICD-10-CM | POA: Diagnosis not present

## 2015-08-25 DIAGNOSIS — Z6841 Body Mass Index (BMI) 40.0 and over, adult: Secondary | ICD-10-CM | POA: Diagnosis not present

## 2015-09-03 DIAGNOSIS — Z79899 Other long term (current) drug therapy: Secondary | ICD-10-CM | POA: Diagnosis not present

## 2015-09-08 DIAGNOSIS — Z1231 Encounter for screening mammogram for malignant neoplasm of breast: Secondary | ICD-10-CM | POA: Diagnosis not present

## 2015-09-12 DIAGNOSIS — F319 Bipolar disorder, unspecified: Secondary | ICD-10-CM | POA: Diagnosis not present

## 2015-09-12 DIAGNOSIS — M545 Low back pain: Secondary | ICD-10-CM | POA: Diagnosis not present

## 2015-09-12 DIAGNOSIS — Z8541 Personal history of malignant neoplasm of cervix uteri: Secondary | ICD-10-CM | POA: Diagnosis not present

## 2015-09-12 DIAGNOSIS — M797 Fibromyalgia: Secondary | ICD-10-CM | POA: Diagnosis not present

## 2015-09-12 DIAGNOSIS — Z79899 Other long term (current) drug therapy: Secondary | ICD-10-CM | POA: Diagnosis not present

## 2015-09-12 DIAGNOSIS — M069 Rheumatoid arthritis, unspecified: Secondary | ICD-10-CM | POA: Diagnosis not present

## 2015-09-12 DIAGNOSIS — F17210 Nicotine dependence, cigarettes, uncomplicated: Secondary | ICD-10-CM | POA: Diagnosis not present

## 2015-09-15 DIAGNOSIS — R05 Cough: Secondary | ICD-10-CM | POA: Diagnosis not present

## 2015-09-15 DIAGNOSIS — R0902 Hypoxemia: Secondary | ICD-10-CM | POA: Diagnosis not present

## 2015-09-15 DIAGNOSIS — R0602 Shortness of breath: Secondary | ICD-10-CM | POA: Diagnosis not present

## 2015-09-15 DIAGNOSIS — R140 Abdominal distension (gaseous): Secondary | ICD-10-CM | POA: Diagnosis not present

## 2015-09-15 DIAGNOSIS — R197 Diarrhea, unspecified: Secondary | ICD-10-CM | POA: Diagnosis not present

## 2015-09-15 DIAGNOSIS — R10817 Generalized abdominal tenderness: Secondary | ICD-10-CM | POA: Diagnosis not present

## 2015-09-15 DIAGNOSIS — R3915 Urgency of urination: Secondary | ICD-10-CM | POA: Diagnosis not present

## 2015-09-15 DIAGNOSIS — R079 Chest pain, unspecified: Secondary | ICD-10-CM | POA: Diagnosis not present

## 2015-09-15 DIAGNOSIS — J189 Pneumonia, unspecified organism: Secondary | ICD-10-CM | POA: Diagnosis not present

## 2015-09-16 DIAGNOSIS — J441 Chronic obstructive pulmonary disease with (acute) exacerbation: Secondary | ICD-10-CM | POA: Diagnosis not present

## 2015-09-16 DIAGNOSIS — R05 Cough: Secondary | ICD-10-CM | POA: Diagnosis not present

## 2015-09-16 DIAGNOSIS — J9620 Acute and chronic respiratory failure, unspecified whether with hypoxia or hypercapnia: Secondary | ICD-10-CM | POA: Diagnosis not present

## 2015-09-16 DIAGNOSIS — R938 Abnormal findings on diagnostic imaging of other specified body structures: Secondary | ICD-10-CM | POA: Diagnosis present

## 2015-09-16 DIAGNOSIS — R197 Diarrhea, unspecified: Secondary | ICD-10-CM | POA: Diagnosis not present

## 2015-09-16 DIAGNOSIS — J45901 Unspecified asthma with (acute) exacerbation: Secondary | ICD-10-CM | POA: Diagnosis not present

## 2015-09-16 DIAGNOSIS — R0602 Shortness of breath: Secondary | ICD-10-CM | POA: Diagnosis not present

## 2015-09-16 DIAGNOSIS — R109 Unspecified abdominal pain: Secondary | ICD-10-CM | POA: Diagnosis present

## 2015-09-16 DIAGNOSIS — F319 Bipolar disorder, unspecified: Secondary | ICD-10-CM | POA: Diagnosis present

## 2015-09-16 DIAGNOSIS — F418 Other specified anxiety disorders: Secondary | ICD-10-CM | POA: Diagnosis present

## 2015-09-16 DIAGNOSIS — I10 Essential (primary) hypertension: Secondary | ICD-10-CM | POA: Diagnosis not present

## 2015-09-16 DIAGNOSIS — E7800 Pure hypercholesterolemia, unspecified: Secondary | ICD-10-CM | POA: Diagnosis present

## 2015-09-16 DIAGNOSIS — A419 Sepsis, unspecified organism: Secondary | ICD-10-CM | POA: Diagnosis not present

## 2015-09-16 DIAGNOSIS — J9690 Respiratory failure, unspecified, unspecified whether with hypoxia or hypercapnia: Secondary | ICD-10-CM | POA: Diagnosis not present

## 2015-09-16 DIAGNOSIS — R918 Other nonspecific abnormal finding of lung field: Secondary | ICD-10-CM | POA: Diagnosis not present

## 2015-09-16 DIAGNOSIS — R0902 Hypoxemia: Secondary | ICD-10-CM | POA: Diagnosis not present

## 2015-09-16 DIAGNOSIS — J189 Pneumonia, unspecified organism: Secondary | ICD-10-CM | POA: Diagnosis not present

## 2015-09-16 DIAGNOSIS — Z888 Allergy status to other drugs, medicaments and biological substances status: Secondary | ICD-10-CM | POA: Diagnosis not present

## 2015-09-16 DIAGNOSIS — F17210 Nicotine dependence, cigarettes, uncomplicated: Secondary | ICD-10-CM | POA: Diagnosis present

## 2015-09-16 DIAGNOSIS — R079 Chest pain, unspecified: Secondary | ICD-10-CM | POA: Diagnosis not present

## 2015-09-16 DIAGNOSIS — J159 Unspecified bacterial pneumonia: Secondary | ICD-10-CM | POA: Diagnosis not present

## 2015-09-16 DIAGNOSIS — M069 Rheumatoid arthritis, unspecified: Secondary | ICD-10-CM | POA: Diagnosis not present

## 2015-09-16 DIAGNOSIS — J9601 Acute respiratory failure with hypoxia: Secondary | ICD-10-CM | POA: Diagnosis not present

## 2015-09-28 DIAGNOSIS — R05 Cough: Secondary | ICD-10-CM | POA: Diagnosis not present

## 2015-09-28 DIAGNOSIS — K219 Gastro-esophageal reflux disease without esophagitis: Secondary | ICD-10-CM | POA: Diagnosis not present

## 2015-09-28 DIAGNOSIS — E7800 Pure hypercholesterolemia, unspecified: Secondary | ICD-10-CM | POA: Diagnosis not present

## 2015-09-28 DIAGNOSIS — I10 Essential (primary) hypertension: Secondary | ICD-10-CM | POA: Diagnosis not present

## 2015-09-28 DIAGNOSIS — R0602 Shortness of breath: Secondary | ICD-10-CM | POA: Diagnosis not present

## 2015-09-28 DIAGNOSIS — Z79899 Other long term (current) drug therapy: Secondary | ICD-10-CM | POA: Diagnosis not present

## 2015-09-28 DIAGNOSIS — F17200 Nicotine dependence, unspecified, uncomplicated: Secondary | ICD-10-CM | POA: Diagnosis not present

## 2015-09-28 DIAGNOSIS — J209 Acute bronchitis, unspecified: Secondary | ICD-10-CM | POA: Diagnosis not present

## 2015-10-09 DIAGNOSIS — F319 Bipolar disorder, unspecified: Secondary | ICD-10-CM | POA: Diagnosis not present

## 2015-10-15 DIAGNOSIS — F17210 Nicotine dependence, cigarettes, uncomplicated: Secondary | ICD-10-CM | POA: Diagnosis not present

## 2015-10-15 DIAGNOSIS — J4540 Moderate persistent asthma, uncomplicated: Secondary | ICD-10-CM | POA: Diagnosis not present

## 2015-10-15 DIAGNOSIS — G4733 Obstructive sleep apnea (adult) (pediatric): Secondary | ICD-10-CM | POA: Diagnosis not present

## 2015-10-15 DIAGNOSIS — R918 Other nonspecific abnormal finding of lung field: Secondary | ICD-10-CM | POA: Diagnosis not present

## 2015-10-17 DIAGNOSIS — R079 Chest pain, unspecified: Secondary | ICD-10-CM | POA: Diagnosis not present

## 2015-10-17 DIAGNOSIS — J439 Emphysema, unspecified: Secondary | ICD-10-CM | POA: Diagnosis not present

## 2015-10-17 DIAGNOSIS — J441 Chronic obstructive pulmonary disease with (acute) exacerbation: Secondary | ICD-10-CM | POA: Diagnosis not present

## 2015-10-17 DIAGNOSIS — R0602 Shortness of breath: Secondary | ICD-10-CM | POA: Diagnosis not present

## 2015-10-17 DIAGNOSIS — R05 Cough: Secondary | ICD-10-CM | POA: Diagnosis not present

## 2015-10-29 DIAGNOSIS — G4733 Obstructive sleep apnea (adult) (pediatric): Secondary | ICD-10-CM | POA: Diagnosis not present

## 2015-10-29 DIAGNOSIS — J4540 Moderate persistent asthma, uncomplicated: Secondary | ICD-10-CM | POA: Diagnosis not present

## 2015-10-29 DIAGNOSIS — F17210 Nicotine dependence, cigarettes, uncomplicated: Secondary | ICD-10-CM | POA: Diagnosis not present

## 2015-10-29 DIAGNOSIS — R918 Other nonspecific abnormal finding of lung field: Secondary | ICD-10-CM | POA: Diagnosis not present

## 2015-11-12 DIAGNOSIS — R0600 Dyspnea, unspecified: Secondary | ICD-10-CM | POA: Diagnosis not present

## 2015-11-18 DIAGNOSIS — E785 Hyperlipidemia, unspecified: Secondary | ICD-10-CM | POA: Diagnosis not present

## 2015-11-18 DIAGNOSIS — I10 Essential (primary) hypertension: Secondary | ICD-10-CM | POA: Diagnosis not present

## 2015-11-18 DIAGNOSIS — Z6841 Body Mass Index (BMI) 40.0 and over, adult: Secondary | ICD-10-CM | POA: Diagnosis not present

## 2015-12-03 DIAGNOSIS — R109 Unspecified abdominal pain: Secondary | ICD-10-CM | POA: Diagnosis not present

## 2015-12-04 DIAGNOSIS — F319 Bipolar disorder, unspecified: Secondary | ICD-10-CM | POA: Diagnosis not present

## 2015-12-09 DIAGNOSIS — K760 Fatty (change of) liver, not elsewhere classified: Secondary | ICD-10-CM | POA: Diagnosis not present

## 2015-12-09 DIAGNOSIS — R109 Unspecified abdominal pain: Secondary | ICD-10-CM | POA: Diagnosis not present

## 2015-12-15 DIAGNOSIS — H43813 Vitreous degeneration, bilateral: Secondary | ICD-10-CM | POA: Diagnosis not present

## 2015-12-15 DIAGNOSIS — H5213 Myopia, bilateral: Secondary | ICD-10-CM | POA: Diagnosis not present

## 2015-12-15 DIAGNOSIS — H52223 Regular astigmatism, bilateral: Secondary | ICD-10-CM | POA: Diagnosis not present

## 2015-12-15 DIAGNOSIS — H04123 Dry eye syndrome of bilateral lacrimal glands: Secondary | ICD-10-CM | POA: Diagnosis not present

## 2015-12-17 DIAGNOSIS — K760 Fatty (change of) liver, not elsewhere classified: Secondary | ICD-10-CM | POA: Diagnosis not present

## 2015-12-17 DIAGNOSIS — R748 Abnormal levels of other serum enzymes: Secondary | ICD-10-CM | POA: Diagnosis not present

## 2015-12-17 DIAGNOSIS — R109 Unspecified abdominal pain: Secondary | ICD-10-CM | POA: Diagnosis not present

## 2015-12-17 DIAGNOSIS — D72829 Elevated white blood cell count, unspecified: Secondary | ICD-10-CM | POA: Diagnosis not present

## 2015-12-22 DIAGNOSIS — J4540 Moderate persistent asthma, uncomplicated: Secondary | ICD-10-CM | POA: Diagnosis not present

## 2015-12-22 DIAGNOSIS — G4733 Obstructive sleep apnea (adult) (pediatric): Secondary | ICD-10-CM | POA: Diagnosis not present

## 2015-12-22 DIAGNOSIS — F17210 Nicotine dependence, cigarettes, uncomplicated: Secondary | ICD-10-CM | POA: Diagnosis not present

## 2015-12-22 DIAGNOSIS — R5383 Other fatigue: Secondary | ICD-10-CM | POA: Diagnosis not present

## 2015-12-22 DIAGNOSIS — R918 Other nonspecific abnormal finding of lung field: Secondary | ICD-10-CM | POA: Diagnosis not present

## 2015-12-23 DIAGNOSIS — K219 Gastro-esophageal reflux disease without esophagitis: Secondary | ICD-10-CM | POA: Diagnosis not present

## 2015-12-23 DIAGNOSIS — Z9851 Tubal ligation status: Secondary | ICD-10-CM | POA: Diagnosis not present

## 2015-12-23 DIAGNOSIS — Z9049 Acquired absence of other specified parts of digestive tract: Secondary | ICD-10-CM | POA: Diagnosis not present

## 2015-12-23 DIAGNOSIS — I517 Cardiomegaly: Secondary | ICD-10-CM | POA: Diagnosis not present

## 2015-12-23 DIAGNOSIS — R1013 Epigastric pain: Secondary | ICD-10-CM | POA: Diagnosis not present

## 2015-12-23 DIAGNOSIS — K2970 Gastritis, unspecified, without bleeding: Secondary | ICD-10-CM | POA: Diagnosis not present

## 2015-12-23 DIAGNOSIS — Z720 Tobacco use: Secondary | ICD-10-CM | POA: Diagnosis not present

## 2015-12-23 DIAGNOSIS — M797 Fibromyalgia: Secondary | ICD-10-CM | POA: Diagnosis not present

## 2015-12-23 DIAGNOSIS — M1990 Unspecified osteoarthritis, unspecified site: Secondary | ICD-10-CM | POA: Diagnosis not present

## 2015-12-23 DIAGNOSIS — Z8541 Personal history of malignant neoplasm of cervix uteri: Secondary | ICD-10-CM | POA: Diagnosis not present

## 2015-12-23 DIAGNOSIS — K2900 Acute gastritis without bleeding: Secondary | ICD-10-CM | POA: Diagnosis not present

## 2015-12-23 DIAGNOSIS — R109 Unspecified abdominal pain: Secondary | ICD-10-CM | POA: Diagnosis not present

## 2015-12-23 DIAGNOSIS — I10 Essential (primary) hypertension: Secondary | ICD-10-CM | POA: Diagnosis not present

## 2015-12-23 DIAGNOSIS — R1110 Vomiting, unspecified: Secondary | ICD-10-CM | POA: Diagnosis not present

## 2015-12-23 DIAGNOSIS — G8929 Other chronic pain: Secondary | ICD-10-CM | POA: Diagnosis not present

## 2015-12-23 DIAGNOSIS — Z90710 Acquired absence of both cervix and uterus: Secondary | ICD-10-CM | POA: Diagnosis not present

## 2015-12-23 DIAGNOSIS — E559 Vitamin D deficiency, unspecified: Secondary | ICD-10-CM | POA: Diagnosis not present

## 2015-12-23 DIAGNOSIS — E785 Hyperlipidemia, unspecified: Secondary | ICD-10-CM | POA: Diagnosis not present

## 2015-12-23 DIAGNOSIS — M069 Rheumatoid arthritis, unspecified: Secondary | ICD-10-CM | POA: Diagnosis not present

## 2015-12-23 DIAGNOSIS — F319 Bipolar disorder, unspecified: Secondary | ICD-10-CM | POA: Diagnosis not present

## 2015-12-23 DIAGNOSIS — Z79899 Other long term (current) drug therapy: Secondary | ICD-10-CM | POA: Diagnosis not present

## 2015-12-25 DIAGNOSIS — Z6841 Body Mass Index (BMI) 40.0 and over, adult: Secondary | ICD-10-CM | POA: Diagnosis not present

## 2015-12-25 DIAGNOSIS — Z9981 Dependence on supplemental oxygen: Secondary | ICD-10-CM | POA: Diagnosis not present

## 2015-12-25 DIAGNOSIS — J9610 Chronic respiratory failure, unspecified whether with hypoxia or hypercapnia: Secondary | ICD-10-CM | POA: Diagnosis not present

## 2015-12-25 DIAGNOSIS — J449 Chronic obstructive pulmonary disease, unspecified: Secondary | ICD-10-CM | POA: Diagnosis not present

## 2015-12-28 DIAGNOSIS — R110 Nausea: Secondary | ICD-10-CM | POA: Diagnosis not present

## 2015-12-28 DIAGNOSIS — F319 Bipolar disorder, unspecified: Secondary | ICD-10-CM | POA: Diagnosis not present

## 2015-12-28 DIAGNOSIS — Z79899 Other long term (current) drug therapy: Secondary | ICD-10-CM | POA: Diagnosis not present

## 2015-12-28 DIAGNOSIS — F17200 Nicotine dependence, unspecified, uncomplicated: Secondary | ICD-10-CM | POA: Diagnosis not present

## 2015-12-28 DIAGNOSIS — M064 Inflammatory polyarthropathy: Secondary | ICD-10-CM | POA: Diagnosis not present

## 2015-12-28 DIAGNOSIS — M069 Rheumatoid arthritis, unspecified: Secondary | ICD-10-CM | POA: Diagnosis not present

## 2015-12-28 DIAGNOSIS — M1990 Unspecified osteoarthritis, unspecified site: Secondary | ICD-10-CM | POA: Diagnosis not present

## 2015-12-29 DIAGNOSIS — D72829 Elevated white blood cell count, unspecified: Secondary | ICD-10-CM | POA: Diagnosis not present

## 2015-12-29 DIAGNOSIS — R748 Abnormal levels of other serum enzymes: Secondary | ICD-10-CM | POA: Diagnosis not present

## 2015-12-29 DIAGNOSIS — K760 Fatty (change of) liver, not elsewhere classified: Secondary | ICD-10-CM | POA: Diagnosis not present

## 2015-12-29 DIAGNOSIS — R109 Unspecified abdominal pain: Secondary | ICD-10-CM | POA: Diagnosis not present

## 2016-01-11 DIAGNOSIS — R12 Heartburn: Secondary | ICD-10-CM | POA: Diagnosis not present

## 2016-01-11 DIAGNOSIS — R112 Nausea with vomiting, unspecified: Secondary | ICD-10-CM | POA: Diagnosis not present

## 2016-01-11 DIAGNOSIS — R197 Diarrhea, unspecified: Secondary | ICD-10-CM | POA: Diagnosis not present

## 2016-01-11 DIAGNOSIS — R1084 Generalized abdominal pain: Secondary | ICD-10-CM | POA: Diagnosis not present

## 2016-01-21 DIAGNOSIS — R112 Nausea with vomiting, unspecified: Secondary | ICD-10-CM | POA: Diagnosis not present

## 2016-01-21 DIAGNOSIS — K760 Fatty (change of) liver, not elsewhere classified: Secondary | ICD-10-CM | POA: Diagnosis not present

## 2016-01-21 DIAGNOSIS — K210 Gastro-esophageal reflux disease with esophagitis: Secondary | ICD-10-CM | POA: Diagnosis not present

## 2016-01-21 DIAGNOSIS — R1013 Epigastric pain: Secondary | ICD-10-CM | POA: Diagnosis not present

## 2016-01-23 DIAGNOSIS — R197 Diarrhea, unspecified: Secondary | ICD-10-CM | POA: Diagnosis not present

## 2016-01-26 DIAGNOSIS — J310 Chronic rhinitis: Secondary | ICD-10-CM | POA: Diagnosis not present

## 2016-01-26 DIAGNOSIS — Z6841 Body Mass Index (BMI) 40.0 and over, adult: Secondary | ICD-10-CM | POA: Diagnosis not present

## 2016-01-26 DIAGNOSIS — G4733 Obstructive sleep apnea (adult) (pediatric): Secondary | ICD-10-CM | POA: Diagnosis not present

## 2016-01-26 DIAGNOSIS — Z7189 Other specified counseling: Secondary | ICD-10-CM | POA: Diagnosis not present

## 2016-01-26 DIAGNOSIS — I10 Essential (primary) hypertension: Secondary | ICD-10-CM | POA: Diagnosis not present

## 2016-01-26 DIAGNOSIS — J449 Chronic obstructive pulmonary disease, unspecified: Secondary | ICD-10-CM | POA: Diagnosis not present

## 2016-01-26 DIAGNOSIS — R05 Cough: Secondary | ICD-10-CM | POA: Diagnosis not present

## 2016-01-27 DIAGNOSIS — G4733 Obstructive sleep apnea (adult) (pediatric): Secondary | ICD-10-CM | POA: Diagnosis not present

## 2016-02-02 DIAGNOSIS — J019 Acute sinusitis, unspecified: Secondary | ICD-10-CM | POA: Diagnosis not present

## 2016-02-02 DIAGNOSIS — M797 Fibromyalgia: Secondary | ICD-10-CM | POA: Diagnosis not present

## 2016-02-02 DIAGNOSIS — Z6838 Body mass index (BMI) 38.0-38.9, adult: Secondary | ICD-10-CM | POA: Diagnosis not present

## 2016-02-02 DIAGNOSIS — J302 Other seasonal allergic rhinitis: Secondary | ICD-10-CM | POA: Diagnosis not present

## 2016-02-25 DIAGNOSIS — F17210 Nicotine dependence, cigarettes, uncomplicated: Secondary | ICD-10-CM | POA: Diagnosis not present

## 2016-02-25 DIAGNOSIS — I10 Essential (primary) hypertension: Secondary | ICD-10-CM | POA: Diagnosis not present

## 2016-02-25 DIAGNOSIS — K2900 Acute gastritis without bleeding: Secondary | ICD-10-CM | POA: Diagnosis not present

## 2016-02-25 DIAGNOSIS — F419 Anxiety disorder, unspecified: Secondary | ICD-10-CM | POA: Diagnosis not present

## 2016-02-25 DIAGNOSIS — K2970 Gastritis, unspecified, without bleeding: Secondary | ICD-10-CM | POA: Diagnosis not present

## 2016-02-25 DIAGNOSIS — I252 Old myocardial infarction: Secondary | ICD-10-CM | POA: Diagnosis not present

## 2016-02-25 DIAGNOSIS — K449 Diaphragmatic hernia without obstruction or gangrene: Secondary | ICD-10-CM | POA: Diagnosis not present

## 2016-02-25 DIAGNOSIS — G4733 Obstructive sleep apnea (adult) (pediatric): Secondary | ICD-10-CM | POA: Diagnosis not present

## 2016-02-25 DIAGNOSIS — Z9049 Acquired absence of other specified parts of digestive tract: Secondary | ICD-10-CM | POA: Diagnosis not present

## 2016-02-25 DIAGNOSIS — J449 Chronic obstructive pulmonary disease, unspecified: Secondary | ICD-10-CM | POA: Diagnosis not present

## 2016-02-25 DIAGNOSIS — M069 Rheumatoid arthritis, unspecified: Secondary | ICD-10-CM | POA: Diagnosis not present

## 2016-02-25 DIAGNOSIS — E785 Hyperlipidemia, unspecified: Secondary | ICD-10-CM | POA: Diagnosis not present

## 2016-02-25 DIAGNOSIS — K210 Gastro-esophageal reflux disease with esophagitis: Secondary | ICD-10-CM | POA: Diagnosis not present

## 2016-02-25 DIAGNOSIS — Z79899 Other long term (current) drug therapy: Secondary | ICD-10-CM | POA: Diagnosis not present

## 2016-02-25 DIAGNOSIS — F319 Bipolar disorder, unspecified: Secondary | ICD-10-CM | POA: Diagnosis not present

## 2016-02-25 DIAGNOSIS — R112 Nausea with vomiting, unspecified: Secondary | ICD-10-CM | POA: Diagnosis not present

## 2016-02-25 DIAGNOSIS — G43909 Migraine, unspecified, not intractable, without status migrainosus: Secondary | ICD-10-CM | POA: Diagnosis not present

## 2016-02-25 DIAGNOSIS — K760 Fatty (change of) liver, not elsewhere classified: Secondary | ICD-10-CM | POA: Diagnosis not present

## 2016-02-25 DIAGNOSIS — R1013 Epigastric pain: Secondary | ICD-10-CM | POA: Diagnosis not present

## 2016-02-25 HISTORY — PX: ESOPHAGOGASTRODUODENOSCOPY: SHX1529

## 2016-03-08 DIAGNOSIS — E785 Hyperlipidemia, unspecified: Secondary | ICD-10-CM | POA: Diagnosis not present

## 2016-03-08 DIAGNOSIS — I10 Essential (primary) hypertension: Secondary | ICD-10-CM | POA: Diagnosis not present

## 2016-03-08 DIAGNOSIS — M797 Fibromyalgia: Secondary | ICD-10-CM | POA: Diagnosis not present

## 2016-03-08 DIAGNOSIS — J449 Chronic obstructive pulmonary disease, unspecified: Secondary | ICD-10-CM | POA: Diagnosis not present

## 2016-03-17 DIAGNOSIS — E785 Hyperlipidemia, unspecified: Secondary | ICD-10-CM | POA: Diagnosis not present

## 2016-03-17 DIAGNOSIS — I10 Essential (primary) hypertension: Secondary | ICD-10-CM | POA: Diagnosis not present

## 2016-03-24 DIAGNOSIS — R110 Nausea: Secondary | ICD-10-CM | POA: Diagnosis not present

## 2016-03-24 DIAGNOSIS — M25472 Effusion, left ankle: Secondary | ICD-10-CM | POA: Diagnosis not present

## 2016-03-24 DIAGNOSIS — E7800 Pure hypercholesterolemia, unspecified: Secondary | ICD-10-CM | POA: Diagnosis not present

## 2016-03-24 DIAGNOSIS — I10 Essential (primary) hypertension: Secondary | ICD-10-CM | POA: Diagnosis not present

## 2016-03-24 DIAGNOSIS — Z888 Allergy status to other drugs, medicaments and biological substances status: Secondary | ICD-10-CM | POA: Diagnosis not present

## 2016-03-24 DIAGNOSIS — M25471 Effusion, right ankle: Secondary | ICD-10-CM | POA: Diagnosis not present

## 2016-03-24 DIAGNOSIS — Z79899 Other long term (current) drug therapy: Secondary | ICD-10-CM | POA: Diagnosis not present

## 2016-03-24 DIAGNOSIS — R7301 Impaired fasting glucose: Secondary | ICD-10-CM | POA: Diagnosis not present

## 2016-03-24 DIAGNOSIS — E785 Hyperlipidemia, unspecified: Secondary | ICD-10-CM | POA: Diagnosis not present

## 2016-03-24 DIAGNOSIS — Z6836 Body mass index (BMI) 36.0-36.9, adult: Secondary | ICD-10-CM | POA: Diagnosis not present

## 2016-03-24 DIAGNOSIS — M0600 Rheumatoid arthritis without rheumatoid factor, unspecified site: Secondary | ICD-10-CM | POA: Diagnosis not present

## 2016-04-06 DIAGNOSIS — F444 Conversion disorder with motor symptom or deficit: Secondary | ICD-10-CM | POA: Diagnosis not present

## 2016-04-06 DIAGNOSIS — R55 Syncope and collapse: Secondary | ICD-10-CM | POA: Diagnosis present

## 2016-04-06 DIAGNOSIS — F17210 Nicotine dependence, cigarettes, uncomplicated: Secondary | ICD-10-CM | POA: Diagnosis present

## 2016-04-06 DIAGNOSIS — G8191 Hemiplegia, unspecified affecting right dominant side: Secondary | ICD-10-CM | POA: Diagnosis not present

## 2016-04-06 DIAGNOSIS — E119 Type 2 diabetes mellitus without complications: Secondary | ICD-10-CM | POA: Diagnosis present

## 2016-04-06 DIAGNOSIS — M069 Rheumatoid arthritis, unspecified: Secondary | ICD-10-CM | POA: Diagnosis present

## 2016-04-06 DIAGNOSIS — I6522 Occlusion and stenosis of left carotid artery: Secondary | ICD-10-CM | POA: Diagnosis not present

## 2016-04-06 DIAGNOSIS — M797 Fibromyalgia: Secondary | ICD-10-CM | POA: Diagnosis present

## 2016-04-06 DIAGNOSIS — D72829 Elevated white blood cell count, unspecified: Secondary | ICD-10-CM | POA: Diagnosis not present

## 2016-04-06 DIAGNOSIS — Z79899 Other long term (current) drug therapy: Secondary | ICD-10-CM | POA: Diagnosis not present

## 2016-04-06 DIAGNOSIS — R200 Anesthesia of skin: Secondary | ICD-10-CM | POA: Diagnosis present

## 2016-04-06 DIAGNOSIS — F319 Bipolar disorder, unspecified: Secondary | ICD-10-CM | POA: Diagnosis present

## 2016-04-06 DIAGNOSIS — Z8541 Personal history of malignant neoplasm of cervix uteri: Secondary | ICD-10-CM | POA: Diagnosis not present

## 2016-04-06 DIAGNOSIS — J9811 Atelectasis: Secondary | ICD-10-CM | POA: Diagnosis not present

## 2016-04-06 DIAGNOSIS — S161XXA Strain of muscle, fascia and tendon at neck level, initial encounter: Secondary | ICD-10-CM | POA: Diagnosis not present

## 2016-04-06 DIAGNOSIS — Z833 Family history of diabetes mellitus: Secondary | ICD-10-CM | POA: Diagnosis not present

## 2016-04-06 DIAGNOSIS — Z8249 Family history of ischemic heart disease and other diseases of the circulatory system: Secondary | ICD-10-CM | POA: Diagnosis not present

## 2016-04-06 DIAGNOSIS — I6350 Cerebral infarction due to unspecified occlusion or stenosis of unspecified cerebral artery: Secondary | ICD-10-CM | POA: Diagnosis not present

## 2016-04-06 DIAGNOSIS — M542 Cervicalgia: Secondary | ICD-10-CM | POA: Diagnosis not present

## 2016-04-06 DIAGNOSIS — R531 Weakness: Secondary | ICD-10-CM | POA: Diagnosis not present

## 2016-04-06 DIAGNOSIS — I639 Cerebral infarction, unspecified: Secondary | ICD-10-CM | POA: Diagnosis not present

## 2016-04-06 DIAGNOSIS — R569 Unspecified convulsions: Secondary | ICD-10-CM | POA: Diagnosis not present

## 2016-04-06 DIAGNOSIS — R918 Other nonspecific abnormal finding of lung field: Secondary | ICD-10-CM | POA: Diagnosis not present

## 2016-04-06 DIAGNOSIS — M47812 Spondylosis without myelopathy or radiculopathy, cervical region: Secondary | ICD-10-CM | POA: Diagnosis not present

## 2016-04-06 DIAGNOSIS — E785 Hyperlipidemia, unspecified: Secondary | ICD-10-CM | POA: Diagnosis present

## 2016-04-06 DIAGNOSIS — Z720 Tobacco use: Secondary | ICD-10-CM | POA: Diagnosis not present

## 2016-04-06 DIAGNOSIS — I10 Essential (primary) hypertension: Secondary | ICD-10-CM | POA: Diagnosis present

## 2016-04-06 DIAGNOSIS — F449 Dissociative and conversion disorder, unspecified: Secondary | ICD-10-CM | POA: Diagnosis not present

## 2016-04-06 DIAGNOSIS — J329 Chronic sinusitis, unspecified: Secondary | ICD-10-CM | POA: Diagnosis not present

## 2016-04-06 DIAGNOSIS — R29706 NIHSS score 6: Secondary | ICD-10-CM | POA: Diagnosis not present

## 2016-04-06 DIAGNOSIS — M1990 Unspecified osteoarthritis, unspecified site: Secondary | ICD-10-CM | POA: Diagnosis present

## 2016-04-06 DIAGNOSIS — Z9049 Acquired absence of other specified parts of digestive tract: Secondary | ICD-10-CM | POA: Diagnosis not present

## 2016-04-12 DIAGNOSIS — I10 Essential (primary) hypertension: Secondary | ICD-10-CM | POA: Diagnosis not present

## 2016-04-12 DIAGNOSIS — E785 Hyperlipidemia, unspecified: Secondary | ICD-10-CM | POA: Diagnosis not present

## 2016-04-12 DIAGNOSIS — R262 Difficulty in walking, not elsewhere classified: Secondary | ICD-10-CM | POA: Diagnosis not present

## 2016-04-12 DIAGNOSIS — M797 Fibromyalgia: Secondary | ICD-10-CM | POA: Diagnosis not present

## 2016-04-14 DIAGNOSIS — R262 Difficulty in walking, not elsewhere classified: Secondary | ICD-10-CM | POA: Diagnosis not present

## 2016-04-14 DIAGNOSIS — W19XXXD Unspecified fall, subsequent encounter: Secondary | ICD-10-CM | POA: Diagnosis not present

## 2016-04-14 DIAGNOSIS — Y92099 Unspecified place in other non-institutional residence as the place of occurrence of the external cause: Secondary | ICD-10-CM | POA: Diagnosis not present

## 2016-04-14 DIAGNOSIS — R531 Weakness: Secondary | ICD-10-CM | POA: Diagnosis not present

## 2016-04-19 DIAGNOSIS — Z6836 Body mass index (BMI) 36.0-36.9, adult: Secondary | ICD-10-CM | POA: Diagnosis not present

## 2016-04-19 DIAGNOSIS — W19XXXD Unspecified fall, subsequent encounter: Secondary | ICD-10-CM | POA: Diagnosis not present

## 2016-04-19 DIAGNOSIS — M1991 Primary osteoarthritis, unspecified site: Secondary | ICD-10-CM | POA: Diagnosis not present

## 2016-04-19 DIAGNOSIS — I10 Essential (primary) hypertension: Secondary | ICD-10-CM | POA: Diagnosis not present

## 2016-04-19 DIAGNOSIS — F17210 Nicotine dependence, cigarettes, uncomplicated: Secondary | ICD-10-CM | POA: Diagnosis not present

## 2016-04-19 DIAGNOSIS — F319 Bipolar disorder, unspecified: Secondary | ICD-10-CM | POA: Diagnosis not present

## 2016-04-19 DIAGNOSIS — J449 Chronic obstructive pulmonary disease, unspecified: Secondary | ICD-10-CM | POA: Diagnosis not present

## 2016-04-19 DIAGNOSIS — R262 Difficulty in walking, not elsewhere classified: Secondary | ICD-10-CM | POA: Diagnosis not present

## 2016-04-19 DIAGNOSIS — M069 Rheumatoid arthritis, unspecified: Secondary | ICD-10-CM | POA: Diagnosis not present

## 2016-04-19 DIAGNOSIS — M797 Fibromyalgia: Secondary | ICD-10-CM | POA: Diagnosis not present

## 2016-04-19 DIAGNOSIS — F419 Anxiety disorder, unspecified: Secondary | ICD-10-CM | POA: Diagnosis not present

## 2016-04-19 DIAGNOSIS — F444 Conversion disorder with motor symptom or deficit: Secondary | ICD-10-CM | POA: Diagnosis not present

## 2016-04-19 DIAGNOSIS — R531 Weakness: Secondary | ICD-10-CM | POA: Diagnosis not present

## 2016-04-22 DIAGNOSIS — R262 Difficulty in walking, not elsewhere classified: Secondary | ICD-10-CM | POA: Diagnosis not present

## 2016-04-22 DIAGNOSIS — R531 Weakness: Secondary | ICD-10-CM | POA: Diagnosis not present

## 2016-04-22 DIAGNOSIS — I10 Essential (primary) hypertension: Secondary | ICD-10-CM | POA: Diagnosis not present

## 2016-04-22 DIAGNOSIS — J449 Chronic obstructive pulmonary disease, unspecified: Secondary | ICD-10-CM | POA: Diagnosis not present

## 2016-04-22 DIAGNOSIS — F444 Conversion disorder with motor symptom or deficit: Secondary | ICD-10-CM | POA: Diagnosis not present

## 2016-04-22 DIAGNOSIS — M069 Rheumatoid arthritis, unspecified: Secondary | ICD-10-CM | POA: Diagnosis not present

## 2016-04-29 DIAGNOSIS — I639 Cerebral infarction, unspecified: Secondary | ICD-10-CM | POA: Diagnosis not present

## 2016-04-29 DIAGNOSIS — Z8673 Personal history of transient ischemic attack (TIA), and cerebral infarction without residual deficits: Secondary | ICD-10-CM | POA: Diagnosis not present

## 2016-04-29 DIAGNOSIS — S0990XA Unspecified injury of head, initial encounter: Secondary | ICD-10-CM | POA: Diagnosis not present

## 2016-04-29 DIAGNOSIS — R531 Weakness: Secondary | ICD-10-CM | POA: Diagnosis not present

## 2016-04-29 DIAGNOSIS — F449 Dissociative and conversion disorder, unspecified: Secondary | ICD-10-CM | POA: Diagnosis not present

## 2016-04-29 DIAGNOSIS — M25511 Pain in right shoulder: Secondary | ICD-10-CM | POA: Diagnosis not present

## 2016-04-29 DIAGNOSIS — H532 Diplopia: Secondary | ICD-10-CM | POA: Diagnosis not present

## 2016-05-05 DIAGNOSIS — M6281 Muscle weakness (generalized): Secondary | ICD-10-CM | POA: Diagnosis not present

## 2016-05-05 DIAGNOSIS — R2689 Other abnormalities of gait and mobility: Secondary | ICD-10-CM | POA: Diagnosis not present

## 2016-05-17 DIAGNOSIS — R1013 Epigastric pain: Secondary | ICD-10-CM | POA: Diagnosis not present

## 2016-05-17 DIAGNOSIS — K760 Fatty (change of) liver, not elsewhere classified: Secondary | ICD-10-CM | POA: Diagnosis not present

## 2016-05-17 DIAGNOSIS — R2689 Other abnormalities of gait and mobility: Secondary | ICD-10-CM | POA: Diagnosis not present

## 2016-05-17 DIAGNOSIS — M6281 Muscle weakness (generalized): Secondary | ICD-10-CM | POA: Diagnosis not present

## 2016-05-18 DIAGNOSIS — R262 Difficulty in walking, not elsewhere classified: Secondary | ICD-10-CM | POA: Diagnosis not present

## 2016-05-18 DIAGNOSIS — I10 Essential (primary) hypertension: Secondary | ICD-10-CM | POA: Diagnosis not present

## 2016-05-18 DIAGNOSIS — M797 Fibromyalgia: Secondary | ICD-10-CM | POA: Diagnosis not present

## 2016-05-18 DIAGNOSIS — E785 Hyperlipidemia, unspecified: Secondary | ICD-10-CM | POA: Diagnosis not present

## 2016-05-19 DIAGNOSIS — M6281 Muscle weakness (generalized): Secondary | ICD-10-CM | POA: Diagnosis not present

## 2016-05-19 DIAGNOSIS — R2689 Other abnormalities of gait and mobility: Secondary | ICD-10-CM | POA: Diagnosis not present

## 2016-05-24 DIAGNOSIS — M6281 Muscle weakness (generalized): Secondary | ICD-10-CM | POA: Diagnosis not present

## 2016-05-24 DIAGNOSIS — R2689 Other abnormalities of gait and mobility: Secondary | ICD-10-CM | POA: Diagnosis not present

## 2016-05-26 DIAGNOSIS — R2689 Other abnormalities of gait and mobility: Secondary | ICD-10-CM | POA: Diagnosis not present

## 2016-05-26 DIAGNOSIS — M6281 Muscle weakness (generalized): Secondary | ICD-10-CM | POA: Diagnosis not present

## 2016-06-05 DIAGNOSIS — J449 Chronic obstructive pulmonary disease, unspecified: Secondary | ICD-10-CM | POA: Diagnosis not present

## 2016-06-15 DIAGNOSIS — R05 Cough: Secondary | ICD-10-CM | POA: Diagnosis not present

## 2016-06-15 DIAGNOSIS — R112 Nausea with vomiting, unspecified: Secondary | ICD-10-CM | POA: Diagnosis not present

## 2016-06-15 DIAGNOSIS — Z6835 Body mass index (BMI) 35.0-35.9, adult: Secondary | ICD-10-CM | POA: Diagnosis not present

## 2016-06-15 DIAGNOSIS — R062 Wheezing: Secondary | ICD-10-CM | POA: Diagnosis not present

## 2016-06-21 DIAGNOSIS — R1013 Epigastric pain: Secondary | ICD-10-CM | POA: Diagnosis not present

## 2016-07-14 DIAGNOSIS — T148 Other injury of unspecified body region: Secondary | ICD-10-CM | POA: Diagnosis not present

## 2016-07-14 DIAGNOSIS — R51 Headache: Secondary | ICD-10-CM | POA: Diagnosis not present

## 2016-07-14 DIAGNOSIS — S299XXA Unspecified injury of thorax, initial encounter: Secondary | ICD-10-CM | POA: Diagnosis not present

## 2016-07-14 DIAGNOSIS — M542 Cervicalgia: Secondary | ICD-10-CM | POA: Diagnosis not present

## 2016-07-14 DIAGNOSIS — M546 Pain in thoracic spine: Secondary | ICD-10-CM | POA: Diagnosis not present

## 2016-07-14 DIAGNOSIS — M5489 Other dorsalgia: Secondary | ICD-10-CM | POA: Diagnosis not present

## 2016-07-14 DIAGNOSIS — R0789 Other chest pain: Secondary | ICD-10-CM | POA: Diagnosis not present

## 2016-07-26 DIAGNOSIS — F339 Major depressive disorder, recurrent, unspecified: Secondary | ICD-10-CM | POA: Diagnosis not present

## 2016-07-26 DIAGNOSIS — G43019 Migraine without aura, intractable, without status migrainosus: Secondary | ICD-10-CM | POA: Diagnosis not present

## 2016-07-26 DIAGNOSIS — Z9181 History of falling: Secondary | ICD-10-CM | POA: Diagnosis not present

## 2016-07-26 DIAGNOSIS — R200 Anesthesia of skin: Secondary | ICD-10-CM | POA: Diagnosis not present

## 2016-07-26 DIAGNOSIS — R202 Paresthesia of skin: Secondary | ICD-10-CM | POA: Diagnosis not present

## 2016-07-26 DIAGNOSIS — R4020 Unspecified coma: Secondary | ICD-10-CM | POA: Diagnosis not present

## 2016-07-26 DIAGNOSIS — R531 Weakness: Secondary | ICD-10-CM | POA: Diagnosis not present

## 2016-08-02 DIAGNOSIS — N951 Menopausal and female climacteric states: Secondary | ICD-10-CM | POA: Diagnosis not present

## 2016-08-02 DIAGNOSIS — Z23 Encounter for immunization: Secondary | ICD-10-CM | POA: Diagnosis not present

## 2016-08-02 DIAGNOSIS — E8941 Symptomatic postprocedural ovarian failure: Secondary | ICD-10-CM | POA: Diagnosis not present

## 2016-08-11 DIAGNOSIS — I10 Essential (primary) hypertension: Secondary | ICD-10-CM | POA: Diagnosis not present

## 2016-08-11 DIAGNOSIS — M1990 Unspecified osteoarthritis, unspecified site: Secondary | ICD-10-CM | POA: Diagnosis not present

## 2016-08-11 DIAGNOSIS — M797 Fibromyalgia: Secondary | ICD-10-CM | POA: Diagnosis not present

## 2016-08-11 DIAGNOSIS — F17200 Nicotine dependence, unspecified, uncomplicated: Secondary | ICD-10-CM | POA: Diagnosis not present

## 2016-08-11 DIAGNOSIS — E7800 Pure hypercholesterolemia, unspecified: Secondary | ICD-10-CM | POA: Diagnosis not present

## 2016-08-11 DIAGNOSIS — Z79899 Other long term (current) drug therapy: Secondary | ICD-10-CM | POA: Diagnosis not present

## 2016-08-11 DIAGNOSIS — I639 Cerebral infarction, unspecified: Secondary | ICD-10-CM | POA: Diagnosis not present

## 2016-08-11 DIAGNOSIS — Z888 Allergy status to other drugs, medicaments and biological substances status: Secondary | ICD-10-CM | POA: Diagnosis not present

## 2016-08-11 DIAGNOSIS — Z7952 Long term (current) use of systemic steroids: Secondary | ICD-10-CM | POA: Diagnosis not present

## 2016-08-11 DIAGNOSIS — Z9189 Other specified personal risk factors, not elsewhere classified: Secondary | ICD-10-CM | POA: Diagnosis not present

## 2016-08-11 DIAGNOSIS — Z8673 Personal history of transient ischemic attack (TIA), and cerebral infarction without residual deficits: Secondary | ICD-10-CM | POA: Diagnosis not present

## 2016-08-11 DIAGNOSIS — R7989 Other specified abnormal findings of blood chemistry: Secondary | ICD-10-CM | POA: Diagnosis not present

## 2016-08-11 DIAGNOSIS — M069 Rheumatoid arthritis, unspecified: Secondary | ICD-10-CM | POA: Diagnosis not present

## 2016-08-11 DIAGNOSIS — F319 Bipolar disorder, unspecified: Secondary | ICD-10-CM | POA: Diagnosis not present

## 2016-08-23 DIAGNOSIS — G43019 Migraine without aura, intractable, without status migrainosus: Secondary | ICD-10-CM | POA: Diagnosis not present

## 2016-08-23 DIAGNOSIS — M899 Disorder of bone, unspecified: Secondary | ICD-10-CM | POA: Diagnosis not present

## 2016-08-23 DIAGNOSIS — R202 Paresthesia of skin: Secondary | ICD-10-CM | POA: Diagnosis not present

## 2016-08-23 DIAGNOSIS — F339 Major depressive disorder, recurrent, unspecified: Secondary | ICD-10-CM | POA: Diagnosis not present

## 2016-08-23 DIAGNOSIS — R200 Anesthesia of skin: Secondary | ICD-10-CM | POA: Diagnosis not present

## 2016-09-02 DIAGNOSIS — F329 Major depressive disorder, single episode, unspecified: Secondary | ICD-10-CM | POA: Diagnosis not present

## 2016-09-02 DIAGNOSIS — M069 Rheumatoid arthritis, unspecified: Secondary | ICD-10-CM | POA: Diagnosis present

## 2016-09-02 DIAGNOSIS — F319 Bipolar disorder, unspecified: Secondary | ICD-10-CM | POA: Diagnosis not present

## 2016-09-02 DIAGNOSIS — Z7952 Long term (current) use of systemic steroids: Secondary | ICD-10-CM | POA: Diagnosis not present

## 2016-09-02 DIAGNOSIS — E119 Type 2 diabetes mellitus without complications: Secondary | ICD-10-CM | POA: Diagnosis present

## 2016-09-02 DIAGNOSIS — R45851 Suicidal ideations: Secondary | ICD-10-CM | POA: Diagnosis not present

## 2016-09-02 DIAGNOSIS — I10 Essential (primary) hypertension: Secondary | ICD-10-CM | POA: Diagnosis not present

## 2016-09-02 DIAGNOSIS — R51 Headache: Secondary | ICD-10-CM | POA: Diagnosis present

## 2016-09-05 DIAGNOSIS — M545 Low back pain: Secondary | ICD-10-CM | POA: Diagnosis not present

## 2016-09-05 DIAGNOSIS — M546 Pain in thoracic spine: Secondary | ICD-10-CM | POA: Diagnosis not present

## 2016-09-05 DIAGNOSIS — G8929 Other chronic pain: Secondary | ICD-10-CM | POA: Diagnosis not present

## 2016-09-05 DIAGNOSIS — M549 Dorsalgia, unspecified: Secondary | ICD-10-CM | POA: Diagnosis not present

## 2016-09-05 DIAGNOSIS — I10 Essential (primary) hypertension: Secondary | ICD-10-CM | POA: Diagnosis not present

## 2016-09-05 DIAGNOSIS — M47812 Spondylosis without myelopathy or radiculopathy, cervical region: Secondary | ICD-10-CM | POA: Diagnosis not present

## 2016-09-05 DIAGNOSIS — M797 Fibromyalgia: Secondary | ICD-10-CM | POA: Diagnosis not present

## 2016-09-23 DIAGNOSIS — F319 Bipolar disorder, unspecified: Secondary | ICD-10-CM | POA: Diagnosis not present

## 2016-09-27 DIAGNOSIS — E785 Hyperlipidemia, unspecified: Secondary | ICD-10-CM | POA: Diagnosis not present

## 2016-09-27 DIAGNOSIS — M797 Fibromyalgia: Secondary | ICD-10-CM | POA: Diagnosis not present

## 2016-09-27 DIAGNOSIS — I10 Essential (primary) hypertension: Secondary | ICD-10-CM | POA: Diagnosis not present

## 2016-09-27 DIAGNOSIS — J029 Acute pharyngitis, unspecified: Secondary | ICD-10-CM | POA: Diagnosis not present

## 2016-10-25 DIAGNOSIS — R200 Anesthesia of skin: Secondary | ICD-10-CM | POA: Diagnosis not present

## 2016-10-25 DIAGNOSIS — R4020 Unspecified coma: Secondary | ICD-10-CM | POA: Diagnosis not present

## 2016-10-25 DIAGNOSIS — F339 Major depressive disorder, recurrent, unspecified: Secondary | ICD-10-CM | POA: Diagnosis not present

## 2016-10-25 DIAGNOSIS — R202 Paresthesia of skin: Secondary | ICD-10-CM | POA: Diagnosis not present

## 2016-10-25 DIAGNOSIS — G43019 Migraine without aura, intractable, without status migrainosus: Secondary | ICD-10-CM | POA: Diagnosis not present

## 2016-10-25 DIAGNOSIS — G4700 Insomnia, unspecified: Secondary | ICD-10-CM | POA: Diagnosis not present

## 2016-11-04 DIAGNOSIS — F319 Bipolar disorder, unspecified: Secondary | ICD-10-CM | POA: Diagnosis not present

## 2016-11-18 DIAGNOSIS — M069 Rheumatoid arthritis, unspecified: Secondary | ICD-10-CM | POA: Diagnosis not present

## 2016-11-22 DIAGNOSIS — S39012A Strain of muscle, fascia and tendon of lower back, initial encounter: Secondary | ICD-10-CM | POA: Diagnosis not present

## 2016-11-22 DIAGNOSIS — Z79890 Hormone replacement therapy (postmenopausal): Secondary | ICD-10-CM | POA: Diagnosis not present

## 2016-11-22 DIAGNOSIS — F17210 Nicotine dependence, cigarettes, uncomplicated: Secondary | ICD-10-CM | POA: Diagnosis not present

## 2016-11-22 DIAGNOSIS — Z79899 Other long term (current) drug therapy: Secondary | ICD-10-CM | POA: Diagnosis not present

## 2016-11-22 DIAGNOSIS — M1990 Unspecified osteoarthritis, unspecified site: Secondary | ICD-10-CM | POA: Diagnosis not present

## 2016-11-22 DIAGNOSIS — M069 Rheumatoid arthritis, unspecified: Secondary | ICD-10-CM | POA: Diagnosis not present

## 2016-11-22 DIAGNOSIS — F319 Bipolar disorder, unspecified: Secondary | ICD-10-CM | POA: Diagnosis not present

## 2016-11-22 DIAGNOSIS — M797 Fibromyalgia: Secondary | ICD-10-CM | POA: Diagnosis not present

## 2016-11-22 DIAGNOSIS — M8580 Other specified disorders of bone density and structure, unspecified site: Secondary | ICD-10-CM | POA: Diagnosis not present

## 2016-12-18 DIAGNOSIS — S93401A Sprain of unspecified ligament of right ankle, initial encounter: Secondary | ICD-10-CM | POA: Diagnosis not present

## 2016-12-18 DIAGNOSIS — Z791 Long term (current) use of non-steroidal anti-inflammatories (NSAID): Secondary | ICD-10-CM | POA: Diagnosis not present

## 2016-12-18 DIAGNOSIS — Z79890 Hormone replacement therapy (postmenopausal): Secondary | ICD-10-CM | POA: Diagnosis not present

## 2016-12-18 DIAGNOSIS — F17210 Nicotine dependence, cigarettes, uncomplicated: Secondary | ICD-10-CM | POA: Diagnosis not present

## 2016-12-18 DIAGNOSIS — F319 Bipolar disorder, unspecified: Secondary | ICD-10-CM | POA: Diagnosis not present

## 2016-12-18 DIAGNOSIS — M25571 Pain in right ankle and joints of right foot: Secondary | ICD-10-CM | POA: Diagnosis not present

## 2016-12-18 DIAGNOSIS — Z79899 Other long term (current) drug therapy: Secondary | ICD-10-CM | POA: Diagnosis not present

## 2016-12-18 DIAGNOSIS — M7731 Calcaneal spur, right foot: Secondary | ICD-10-CM | POA: Diagnosis not present

## 2016-12-18 DIAGNOSIS — M797 Fibromyalgia: Secondary | ICD-10-CM | POA: Diagnosis not present

## 2016-12-18 DIAGNOSIS — M069 Rheumatoid arthritis, unspecified: Secondary | ICD-10-CM | POA: Diagnosis not present

## 2016-12-21 DIAGNOSIS — Z888 Allergy status to other drugs, medicaments and biological substances status: Secondary | ICD-10-CM | POA: Diagnosis not present

## 2016-12-21 DIAGNOSIS — M064 Inflammatory polyarthropathy: Secondary | ICD-10-CM | POA: Diagnosis not present

## 2016-12-21 DIAGNOSIS — E7800 Pure hypercholesterolemia, unspecified: Secondary | ICD-10-CM | POA: Diagnosis not present

## 2016-12-21 DIAGNOSIS — L659 Nonscarring hair loss, unspecified: Secondary | ICD-10-CM | POA: Diagnosis not present

## 2016-12-21 DIAGNOSIS — Z79899 Other long term (current) drug therapy: Secondary | ICD-10-CM | POA: Diagnosis not present

## 2016-12-21 DIAGNOSIS — F17210 Nicotine dependence, cigarettes, uncomplicated: Secondary | ICD-10-CM | POA: Diagnosis not present

## 2016-12-21 DIAGNOSIS — M8580 Other specified disorders of bone density and structure, unspecified site: Secondary | ICD-10-CM | POA: Diagnosis not present

## 2016-12-21 DIAGNOSIS — M069 Rheumatoid arthritis, unspecified: Secondary | ICD-10-CM | POA: Diagnosis not present

## 2016-12-21 DIAGNOSIS — I10 Essential (primary) hypertension: Secondary | ICD-10-CM | POA: Diagnosis not present

## 2016-12-21 DIAGNOSIS — Z8673 Personal history of transient ischemic attack (TIA), and cerebral infarction without residual deficits: Secondary | ICD-10-CM | POA: Diagnosis not present

## 2017-01-04 DIAGNOSIS — R1083 Colic: Secondary | ICD-10-CM | POA: Diagnosis not present

## 2017-01-04 DIAGNOSIS — Z9049 Acquired absence of other specified parts of digestive tract: Secondary | ICD-10-CM | POA: Diagnosis not present

## 2017-01-04 DIAGNOSIS — J449 Chronic obstructive pulmonary disease, unspecified: Secondary | ICD-10-CM | POA: Diagnosis not present

## 2017-01-04 DIAGNOSIS — R39198 Other difficulties with micturition: Secondary | ICD-10-CM | POA: Diagnosis not present

## 2017-01-04 DIAGNOSIS — N200 Calculus of kidney: Secondary | ICD-10-CM | POA: Diagnosis not present

## 2017-01-04 DIAGNOSIS — J9610 Chronic respiratory failure, unspecified whether with hypoxia or hypercapnia: Secondary | ICD-10-CM | POA: Diagnosis not present

## 2017-01-04 DIAGNOSIS — M069 Rheumatoid arthritis, unspecified: Secondary | ICD-10-CM | POA: Diagnosis not present

## 2017-01-04 DIAGNOSIS — M545 Low back pain: Secondary | ICD-10-CM | POA: Diagnosis not present

## 2017-01-04 DIAGNOSIS — Z79899 Other long term (current) drug therapy: Secondary | ICD-10-CM | POA: Diagnosis not present

## 2017-01-04 DIAGNOSIS — M549 Dorsalgia, unspecified: Secondary | ICD-10-CM | POA: Diagnosis not present

## 2017-01-04 DIAGNOSIS — R109 Unspecified abdominal pain: Secondary | ICD-10-CM | POA: Diagnosis not present

## 2017-01-04 DIAGNOSIS — M797 Fibromyalgia: Secondary | ICD-10-CM | POA: Diagnosis not present

## 2017-01-04 DIAGNOSIS — F319 Bipolar disorder, unspecified: Secondary | ICD-10-CM | POA: Diagnosis not present

## 2017-01-04 DIAGNOSIS — K760 Fatty (change of) liver, not elsewhere classified: Secondary | ICD-10-CM | POA: Diagnosis not present

## 2017-01-27 DIAGNOSIS — F4000 Agoraphobia, unspecified: Secondary | ICD-10-CM | POA: Diagnosis not present

## 2017-02-01 DIAGNOSIS — Z79899 Other long term (current) drug therapy: Secondary | ICD-10-CM | POA: Diagnosis not present

## 2017-02-01 DIAGNOSIS — M1990 Unspecified osteoarthritis, unspecified site: Secondary | ICD-10-CM | POA: Diagnosis not present

## 2017-02-01 DIAGNOSIS — F319 Bipolar disorder, unspecified: Secondary | ICD-10-CM | POA: Diagnosis not present

## 2017-02-01 DIAGNOSIS — F17210 Nicotine dependence, cigarettes, uncomplicated: Secondary | ICD-10-CM | POA: Diagnosis not present

## 2017-02-01 DIAGNOSIS — M797 Fibromyalgia: Secondary | ICD-10-CM | POA: Diagnosis not present

## 2017-02-02 DIAGNOSIS — F331 Major depressive disorder, recurrent, moderate: Secondary | ICD-10-CM | POA: Diagnosis not present

## 2017-02-02 DIAGNOSIS — M797 Fibromyalgia: Secondary | ICD-10-CM | POA: Diagnosis not present

## 2017-02-02 DIAGNOSIS — Z8541 Personal history of malignant neoplasm of cervix uteri: Secondary | ICD-10-CM | POA: Diagnosis not present

## 2017-02-02 DIAGNOSIS — K449 Diaphragmatic hernia without obstruction or gangrene: Secondary | ICD-10-CM | POA: Diagnosis not present

## 2017-02-02 DIAGNOSIS — I10 Essential (primary) hypertension: Secondary | ICD-10-CM | POA: Diagnosis not present

## 2017-02-02 DIAGNOSIS — Z79899 Other long term (current) drug therapy: Secondary | ICD-10-CM | POA: Diagnosis not present

## 2017-02-02 DIAGNOSIS — E559 Vitamin D deficiency, unspecified: Secondary | ICD-10-CM | POA: Diagnosis not present

## 2017-02-02 DIAGNOSIS — R5381 Other malaise: Secondary | ICD-10-CM | POA: Diagnosis not present

## 2017-02-02 DIAGNOSIS — J3089 Other allergic rhinitis: Secondary | ICD-10-CM | POA: Diagnosis not present

## 2017-02-02 DIAGNOSIS — F3162 Bipolar disorder, current episode mixed, moderate: Secondary | ICD-10-CM | POA: Diagnosis not present

## 2017-02-02 DIAGNOSIS — E782 Mixed hyperlipidemia: Secondary | ICD-10-CM | POA: Diagnosis not present

## 2017-02-02 DIAGNOSIS — R5383 Other fatigue: Secondary | ICD-10-CM | POA: Diagnosis not present

## 2017-02-02 DIAGNOSIS — R748 Abnormal levels of other serum enzymes: Secondary | ICD-10-CM | POA: Diagnosis not present

## 2017-02-02 DIAGNOSIS — M1990 Unspecified osteoarthritis, unspecified site: Secondary | ICD-10-CM | POA: Diagnosis not present

## 2017-02-15 DIAGNOSIS — M25551 Pain in right hip: Secondary | ICD-10-CM | POA: Diagnosis not present

## 2017-02-15 DIAGNOSIS — M797 Fibromyalgia: Secondary | ICD-10-CM | POA: Diagnosis not present

## 2017-02-15 DIAGNOSIS — G43909 Migraine, unspecified, not intractable, without status migrainosus: Secondary | ICD-10-CM | POA: Diagnosis not present

## 2017-02-15 DIAGNOSIS — M545 Low back pain: Secondary | ICD-10-CM | POA: Diagnosis not present

## 2017-02-15 DIAGNOSIS — F329 Major depressive disorder, single episode, unspecified: Secondary | ICD-10-CM | POA: Diagnosis not present

## 2017-02-15 DIAGNOSIS — M069 Rheumatoid arthritis, unspecified: Secondary | ICD-10-CM | POA: Diagnosis not present

## 2017-02-15 DIAGNOSIS — G894 Chronic pain syndrome: Secondary | ICD-10-CM | POA: Diagnosis not present

## 2017-02-17 DIAGNOSIS — R748 Abnormal levels of other serum enzymes: Secondary | ICD-10-CM | POA: Diagnosis not present

## 2017-02-22 DIAGNOSIS — S20229A Contusion of unspecified back wall of thorax, initial encounter: Secondary | ICD-10-CM | POA: Diagnosis not present

## 2017-02-22 DIAGNOSIS — F17210 Nicotine dependence, cigarettes, uncomplicated: Secondary | ICD-10-CM | POA: Diagnosis not present

## 2017-02-22 DIAGNOSIS — R202 Paresthesia of skin: Secondary | ICD-10-CM | POA: Diagnosis not present

## 2017-02-22 DIAGNOSIS — M1990 Unspecified osteoarthritis, unspecified site: Secondary | ICD-10-CM | POA: Diagnosis not present

## 2017-02-22 DIAGNOSIS — M797 Fibromyalgia: Secondary | ICD-10-CM | POA: Diagnosis not present

## 2017-02-22 DIAGNOSIS — M546 Pain in thoracic spine: Secondary | ICD-10-CM | POA: Diagnosis not present

## 2017-02-22 DIAGNOSIS — F319 Bipolar disorder, unspecified: Secondary | ICD-10-CM | POA: Diagnosis not present

## 2017-02-22 DIAGNOSIS — Z79899 Other long term (current) drug therapy: Secondary | ICD-10-CM | POA: Diagnosis not present

## 2017-02-24 DIAGNOSIS — F319 Bipolar disorder, unspecified: Secondary | ICD-10-CM | POA: Diagnosis not present

## 2017-02-24 DIAGNOSIS — M545 Low back pain: Secondary | ICD-10-CM | POA: Diagnosis not present

## 2017-02-28 DIAGNOSIS — M545 Low back pain: Secondary | ICD-10-CM | POA: Diagnosis not present

## 2017-03-01 DIAGNOSIS — M069 Rheumatoid arthritis, unspecified: Secondary | ICD-10-CM | POA: Diagnosis not present

## 2017-03-01 DIAGNOSIS — M797 Fibromyalgia: Secondary | ICD-10-CM | POA: Diagnosis not present

## 2017-03-01 DIAGNOSIS — Z8739 Personal history of other diseases of the musculoskeletal system and connective tissue: Secondary | ICD-10-CM | POA: Diagnosis not present

## 2017-03-01 DIAGNOSIS — E669 Obesity, unspecified: Secondary | ICD-10-CM | POA: Diagnosis not present

## 2017-03-01 DIAGNOSIS — R42 Dizziness and giddiness: Secondary | ICD-10-CM | POA: Diagnosis not present

## 2017-03-01 DIAGNOSIS — R55 Syncope and collapse: Secondary | ICD-10-CM | POA: Diagnosis not present

## 2017-03-01 DIAGNOSIS — Z9049 Acquired absence of other specified parts of digestive tract: Secondary | ICD-10-CM | POA: Diagnosis not present

## 2017-03-01 DIAGNOSIS — F488 Other specified nonpsychotic mental disorders: Secondary | ICD-10-CM | POA: Diagnosis not present

## 2017-03-01 DIAGNOSIS — F17210 Nicotine dependence, cigarettes, uncomplicated: Secondary | ICD-10-CM | POA: Diagnosis not present

## 2017-03-01 DIAGNOSIS — F319 Bipolar disorder, unspecified: Secondary | ICD-10-CM | POA: Diagnosis not present

## 2017-03-01 DIAGNOSIS — Z79899 Other long term (current) drug therapy: Secondary | ICD-10-CM | POA: Diagnosis not present

## 2017-03-01 DIAGNOSIS — Z90710 Acquired absence of both cervix and uterus: Secondary | ICD-10-CM | POA: Diagnosis not present

## 2017-03-01 DIAGNOSIS — F449 Dissociative and conversion disorder, unspecified: Secondary | ICD-10-CM | POA: Diagnosis not present

## 2017-03-02 DIAGNOSIS — K210 Gastro-esophageal reflux disease with esophagitis: Secondary | ICD-10-CM | POA: Diagnosis not present

## 2017-03-02 DIAGNOSIS — K760 Fatty (change of) liver, not elsewhere classified: Secondary | ICD-10-CM | POA: Diagnosis not present

## 2017-03-02 DIAGNOSIS — K3184 Gastroparesis: Secondary | ICD-10-CM | POA: Diagnosis not present

## 2017-03-02 DIAGNOSIS — R112 Nausea with vomiting, unspecified: Secondary | ICD-10-CM | POA: Diagnosis not present

## 2017-03-07 DIAGNOSIS — R55 Syncope and collapse: Secondary | ICD-10-CM | POA: Diagnosis not present

## 2017-03-07 DIAGNOSIS — G43019 Migraine without aura, intractable, without status migrainosus: Secondary | ICD-10-CM | POA: Diagnosis not present

## 2017-03-09 DIAGNOSIS — M1990 Unspecified osteoarthritis, unspecified site: Secondary | ICD-10-CM | POA: Diagnosis not present

## 2017-03-09 DIAGNOSIS — Z79899 Other long term (current) drug therapy: Secondary | ICD-10-CM | POA: Diagnosis not present

## 2017-03-09 DIAGNOSIS — S20211A Contusion of right front wall of thorax, initial encounter: Secondary | ICD-10-CM | POA: Diagnosis not present

## 2017-03-09 DIAGNOSIS — R0781 Pleurodynia: Secondary | ICD-10-CM | POA: Diagnosis not present

## 2017-03-09 DIAGNOSIS — F319 Bipolar disorder, unspecified: Secondary | ICD-10-CM | POA: Diagnosis not present

## 2017-03-09 DIAGNOSIS — M797 Fibromyalgia: Secondary | ICD-10-CM | POA: Diagnosis not present

## 2017-03-15 DIAGNOSIS — R0789 Other chest pain: Secondary | ICD-10-CM | POA: Diagnosis not present

## 2017-03-15 DIAGNOSIS — Z79899 Other long term (current) drug therapy: Secondary | ICD-10-CM | POA: Diagnosis not present

## 2017-03-15 DIAGNOSIS — M797 Fibromyalgia: Secondary | ICD-10-CM | POA: Diagnosis not present

## 2017-03-15 DIAGNOSIS — F17200 Nicotine dependence, unspecified, uncomplicated: Secondary | ICD-10-CM | POA: Diagnosis not present

## 2017-03-16 DIAGNOSIS — F3162 Bipolar disorder, current episode mixed, moderate: Secondary | ICD-10-CM | POA: Diagnosis not present

## 2017-03-16 DIAGNOSIS — I10 Essential (primary) hypertension: Secondary | ICD-10-CM | POA: Diagnosis not present

## 2017-03-16 DIAGNOSIS — M797 Fibromyalgia: Secondary | ICD-10-CM | POA: Diagnosis not present

## 2017-03-16 DIAGNOSIS — Z79899 Other long term (current) drug therapy: Secondary | ICD-10-CM | POA: Diagnosis not present

## 2017-03-16 DIAGNOSIS — E669 Obesity, unspecified: Secondary | ICD-10-CM | POA: Diagnosis not present

## 2017-03-16 DIAGNOSIS — E782 Mixed hyperlipidemia: Secondary | ICD-10-CM | POA: Diagnosis not present

## 2017-03-27 DIAGNOSIS — M069 Rheumatoid arthritis, unspecified: Secondary | ICD-10-CM | POA: Diagnosis not present

## 2017-03-27 DIAGNOSIS — M797 Fibromyalgia: Secondary | ICD-10-CM | POA: Diagnosis not present

## 2017-03-27 DIAGNOSIS — F329 Major depressive disorder, single episode, unspecified: Secondary | ICD-10-CM | POA: Diagnosis not present

## 2017-03-27 DIAGNOSIS — G894 Chronic pain syndrome: Secondary | ICD-10-CM | POA: Diagnosis not present

## 2017-03-27 DIAGNOSIS — M545 Low back pain: Secondary | ICD-10-CM | POA: Diagnosis not present

## 2017-03-27 DIAGNOSIS — M25551 Pain in right hip: Secondary | ICD-10-CM | POA: Diagnosis not present

## 2017-03-27 DIAGNOSIS — G43909 Migraine, unspecified, not intractable, without status migrainosus: Secondary | ICD-10-CM | POA: Diagnosis not present

## 2017-03-30 DIAGNOSIS — M5126 Other intervertebral disc displacement, lumbar region: Secondary | ICD-10-CM | POA: Diagnosis not present

## 2017-03-30 DIAGNOSIS — M545 Low back pain: Secondary | ICD-10-CM | POA: Diagnosis not present

## 2017-05-08 DIAGNOSIS — E785 Hyperlipidemia, unspecified: Secondary | ICD-10-CM | POA: Diagnosis present

## 2017-05-08 DIAGNOSIS — F3170 Bipolar disorder, currently in remission, most recent episode unspecified: Secondary | ICD-10-CM | POA: Diagnosis not present

## 2017-05-08 DIAGNOSIS — E876 Hypokalemia: Secondary | ICD-10-CM | POA: Diagnosis present

## 2017-05-08 DIAGNOSIS — R45851 Suicidal ideations: Secondary | ICD-10-CM | POA: Diagnosis not present

## 2017-05-08 DIAGNOSIS — F309 Manic episode, unspecified: Secondary | ICD-10-CM | POA: Diagnosis not present

## 2017-05-08 DIAGNOSIS — E119 Type 2 diabetes mellitus without complications: Secondary | ICD-10-CM | POA: Diagnosis not present

## 2017-05-08 DIAGNOSIS — M069 Rheumatoid arthritis, unspecified: Secondary | ICD-10-CM | POA: Diagnosis not present

## 2017-05-08 DIAGNOSIS — K219 Gastro-esophageal reflux disease without esophagitis: Secondary | ICD-10-CM | POA: Diagnosis present

## 2017-05-08 DIAGNOSIS — I10 Essential (primary) hypertension: Secondary | ICD-10-CM | POA: Diagnosis present

## 2017-05-08 DIAGNOSIS — A419 Sepsis, unspecified organism: Secondary | ICD-10-CM | POA: Diagnosis not present

## 2017-05-08 DIAGNOSIS — J159 Unspecified bacterial pneumonia: Secondary | ICD-10-CM | POA: Diagnosis not present

## 2017-05-08 DIAGNOSIS — D72829 Elevated white blood cell count, unspecified: Secondary | ICD-10-CM | POA: Diagnosis not present

## 2017-05-08 DIAGNOSIS — R443 Hallucinations, unspecified: Secondary | ICD-10-CM | POA: Diagnosis not present

## 2017-05-08 DIAGNOSIS — E872 Acidosis: Secondary | ICD-10-CM | POA: Diagnosis not present

## 2017-05-08 DIAGNOSIS — F319 Bipolar disorder, unspecified: Secondary | ICD-10-CM | POA: Diagnosis not present

## 2017-05-08 DIAGNOSIS — J189 Pneumonia, unspecified organism: Secondary | ICD-10-CM | POA: Diagnosis not present

## 2017-05-08 DIAGNOSIS — F17210 Nicotine dependence, cigarettes, uncomplicated: Secondary | ICD-10-CM | POA: Diagnosis present

## 2017-05-15 DIAGNOSIS — F315 Bipolar disorder, current episode depressed, severe, with psychotic features: Secondary | ICD-10-CM | POA: Diagnosis not present

## 2017-05-15 DIAGNOSIS — M797 Fibromyalgia: Secondary | ICD-10-CM | POA: Diagnosis not present

## 2017-05-15 DIAGNOSIS — F29 Unspecified psychosis not due to a substance or known physiological condition: Secondary | ICD-10-CM | POA: Diagnosis not present

## 2017-05-15 DIAGNOSIS — F17210 Nicotine dependence, cigarettes, uncomplicated: Secondary | ICD-10-CM | POA: Diagnosis present

## 2017-05-15 DIAGNOSIS — F4312 Post-traumatic stress disorder, chronic: Secondary | ICD-10-CM | POA: Diagnosis not present

## 2017-05-15 DIAGNOSIS — M069 Rheumatoid arthritis, unspecified: Secondary | ICD-10-CM | POA: Diagnosis present

## 2017-05-15 DIAGNOSIS — R05 Cough: Secondary | ICD-10-CM | POA: Diagnosis not present

## 2017-05-15 DIAGNOSIS — J9811 Atelectasis: Secondary | ICD-10-CM | POA: Diagnosis not present

## 2017-05-15 DIAGNOSIS — E119 Type 2 diabetes mellitus without complications: Secondary | ICD-10-CM | POA: Diagnosis not present

## 2017-05-15 DIAGNOSIS — R0602 Shortness of breath: Secondary | ICD-10-CM | POA: Diagnosis not present

## 2017-05-15 DIAGNOSIS — E785 Hyperlipidemia, unspecified: Secondary | ICD-10-CM | POA: Diagnosis not present

## 2017-05-15 DIAGNOSIS — F319 Bipolar disorder, unspecified: Secondary | ICD-10-CM | POA: Diagnosis not present

## 2017-05-23 DIAGNOSIS — F3162 Bipolar disorder, current episode mixed, moderate: Secondary | ICD-10-CM | POA: Diagnosis not present

## 2017-05-23 DIAGNOSIS — R5383 Other fatigue: Secondary | ICD-10-CM | POA: Diagnosis not present

## 2017-05-23 DIAGNOSIS — I10 Essential (primary) hypertension: Secondary | ICD-10-CM | POA: Diagnosis not present

## 2017-05-23 DIAGNOSIS — F331 Major depressive disorder, recurrent, moderate: Secondary | ICD-10-CM | POA: Diagnosis not present

## 2017-05-23 DIAGNOSIS — R5381 Other malaise: Secondary | ICD-10-CM | POA: Diagnosis not present

## 2017-05-23 DIAGNOSIS — J189 Pneumonia, unspecified organism: Secondary | ICD-10-CM | POA: Diagnosis not present

## 2017-05-24 DIAGNOSIS — F319 Bipolar disorder, unspecified: Secondary | ICD-10-CM | POA: Diagnosis not present

## 2017-05-25 DIAGNOSIS — M797 Fibromyalgia: Secondary | ICD-10-CM | POA: Diagnosis not present

## 2017-05-25 DIAGNOSIS — M5126 Other intervertebral disc displacement, lumbar region: Secondary | ICD-10-CM | POA: Diagnosis not present

## 2017-05-25 DIAGNOSIS — M25551 Pain in right hip: Secondary | ICD-10-CM | POA: Diagnosis not present

## 2017-05-25 DIAGNOSIS — F329 Major depressive disorder, single episode, unspecified: Secondary | ICD-10-CM | POA: Diagnosis not present

## 2017-05-25 DIAGNOSIS — G894 Chronic pain syndrome: Secondary | ICD-10-CM | POA: Diagnosis not present

## 2017-05-25 DIAGNOSIS — G43909 Migraine, unspecified, not intractable, without status migrainosus: Secondary | ICD-10-CM | POA: Diagnosis not present

## 2017-05-25 DIAGNOSIS — F319 Bipolar disorder, unspecified: Secondary | ICD-10-CM | POA: Diagnosis not present

## 2017-05-25 DIAGNOSIS — M069 Rheumatoid arthritis, unspecified: Secondary | ICD-10-CM | POA: Diagnosis not present

## 2017-08-16 DIAGNOSIS — J322 Chronic ethmoidal sinusitis: Secondary | ICD-10-CM | POA: Diagnosis not present

## 2017-08-16 DIAGNOSIS — I10 Essential (primary) hypertension: Secondary | ICD-10-CM | POA: Diagnosis not present

## 2017-08-16 DIAGNOSIS — R42 Dizziness and giddiness: Secondary | ICD-10-CM | POA: Diagnosis not present

## 2017-08-16 DIAGNOSIS — E785 Hyperlipidemia, unspecified: Secondary | ICD-10-CM | POA: Diagnosis not present

## 2017-08-16 DIAGNOSIS — Z79899 Other long term (current) drug therapy: Secondary | ICD-10-CM | POA: Diagnosis not present

## 2017-08-16 DIAGNOSIS — R51 Headache: Secondary | ICD-10-CM | POA: Diagnosis not present

## 2017-08-16 DIAGNOSIS — F17200 Nicotine dependence, unspecified, uncomplicated: Secondary | ICD-10-CM | POA: Diagnosis not present

## 2017-08-22 DIAGNOSIS — H8113 Benign paroxysmal vertigo, bilateral: Secondary | ICD-10-CM | POA: Diagnosis not present

## 2017-08-22 DIAGNOSIS — G43009 Migraine without aura, not intractable, without status migrainosus: Secondary | ICD-10-CM | POA: Diagnosis not present

## 2017-08-22 DIAGNOSIS — J018 Other acute sinusitis: Secondary | ICD-10-CM | POA: Diagnosis not present

## 2017-08-22 DIAGNOSIS — I10 Essential (primary) hypertension: Secondary | ICD-10-CM | POA: Diagnosis not present

## 2017-08-22 DIAGNOSIS — Z23 Encounter for immunization: Secondary | ICD-10-CM | POA: Diagnosis not present

## 2017-08-22 DIAGNOSIS — E876 Hypokalemia: Secondary | ICD-10-CM | POA: Diagnosis not present

## 2017-09-04 DIAGNOSIS — G894 Chronic pain syndrome: Secondary | ICD-10-CM | POA: Diagnosis not present

## 2017-09-04 DIAGNOSIS — M797 Fibromyalgia: Secondary | ICD-10-CM | POA: Diagnosis not present

## 2017-09-04 DIAGNOSIS — F329 Major depressive disorder, single episode, unspecified: Secondary | ICD-10-CM | POA: Diagnosis not present

## 2017-09-04 DIAGNOSIS — G43909 Migraine, unspecified, not intractable, without status migrainosus: Secondary | ICD-10-CM | POA: Diagnosis not present

## 2017-09-04 DIAGNOSIS — M5416 Radiculopathy, lumbar region: Secondary | ICD-10-CM | POA: Diagnosis not present

## 2017-09-04 DIAGNOSIS — F319 Bipolar disorder, unspecified: Secondary | ICD-10-CM | POA: Diagnosis not present

## 2017-09-04 DIAGNOSIS — M5126 Other intervertebral disc displacement, lumbar region: Secondary | ICD-10-CM | POA: Diagnosis not present

## 2017-09-04 DIAGNOSIS — M069 Rheumatoid arthritis, unspecified: Secondary | ICD-10-CM | POA: Diagnosis not present

## 2017-09-04 DIAGNOSIS — M25551 Pain in right hip: Secondary | ICD-10-CM | POA: Diagnosis not present

## 2017-09-11 DIAGNOSIS — J324 Chronic pansinusitis: Secondary | ICD-10-CM | POA: Diagnosis not present

## 2017-10-04 DIAGNOSIS — R32 Unspecified urinary incontinence: Secondary | ICD-10-CM | POA: Diagnosis not present

## 2017-10-04 DIAGNOSIS — M797 Fibromyalgia: Secondary | ICD-10-CM | POA: Diagnosis not present

## 2017-10-04 DIAGNOSIS — F319 Bipolar disorder, unspecified: Secondary | ICD-10-CM | POA: Diagnosis not present

## 2017-10-04 DIAGNOSIS — F329 Major depressive disorder, single episode, unspecified: Secondary | ICD-10-CM | POA: Diagnosis not present

## 2017-10-04 DIAGNOSIS — M1990 Unspecified osteoarthritis, unspecified site: Secondary | ICD-10-CM | POA: Diagnosis not present

## 2017-10-04 DIAGNOSIS — G894 Chronic pain syndrome: Secondary | ICD-10-CM | POA: Diagnosis not present

## 2017-10-04 DIAGNOSIS — Z79899 Other long term (current) drug therapy: Secondary | ICD-10-CM | POA: Diagnosis not present

## 2017-10-04 DIAGNOSIS — G43909 Migraine, unspecified, not intractable, without status migrainosus: Secondary | ICD-10-CM | POA: Diagnosis not present

## 2017-10-04 DIAGNOSIS — M5126 Other intervertebral disc displacement, lumbar region: Secondary | ICD-10-CM | POA: Diagnosis not present

## 2017-10-04 DIAGNOSIS — M545 Low back pain: Secondary | ICD-10-CM | POA: Diagnosis not present

## 2017-10-04 DIAGNOSIS — F419 Anxiety disorder, unspecified: Secondary | ICD-10-CM | POA: Diagnosis not present

## 2017-10-04 DIAGNOSIS — M069 Rheumatoid arthritis, unspecified: Secondary | ICD-10-CM | POA: Diagnosis not present

## 2017-10-04 DIAGNOSIS — M5416 Radiculopathy, lumbar region: Secondary | ICD-10-CM | POA: Diagnosis not present

## 2017-10-04 DIAGNOSIS — I10 Essential (primary) hypertension: Secondary | ICD-10-CM | POA: Diagnosis not present

## 2017-10-04 DIAGNOSIS — G8929 Other chronic pain: Secondary | ICD-10-CM | POA: Diagnosis not present

## 2017-10-04 DIAGNOSIS — M549 Dorsalgia, unspecified: Secondary | ICD-10-CM | POA: Diagnosis not present

## 2017-10-04 DIAGNOSIS — E785 Hyperlipidemia, unspecified: Secondary | ICD-10-CM | POA: Diagnosis not present

## 2017-10-04 DIAGNOSIS — F17210 Nicotine dependence, cigarettes, uncomplicated: Secondary | ICD-10-CM | POA: Diagnosis not present

## 2017-10-04 DIAGNOSIS — M25551 Pain in right hip: Secondary | ICD-10-CM | POA: Diagnosis not present

## 2017-10-04 DIAGNOSIS — G971 Other reaction to spinal and lumbar puncture: Secondary | ICD-10-CM | POA: Diagnosis not present

## 2017-10-04 DIAGNOSIS — T819XXA Unspecified complication of procedure, initial encounter: Secondary | ICD-10-CM | POA: Diagnosis not present

## 2017-10-19 DIAGNOSIS — M5416 Radiculopathy, lumbar region: Secondary | ICD-10-CM | POA: Diagnosis not present

## 2017-10-19 DIAGNOSIS — G43909 Migraine, unspecified, not intractable, without status migrainosus: Secondary | ICD-10-CM | POA: Diagnosis not present

## 2017-10-19 DIAGNOSIS — F329 Major depressive disorder, single episode, unspecified: Secondary | ICD-10-CM | POA: Diagnosis not present

## 2017-10-19 DIAGNOSIS — F319 Bipolar disorder, unspecified: Secondary | ICD-10-CM | POA: Diagnosis not present

## 2017-10-19 DIAGNOSIS — M25551 Pain in right hip: Secondary | ICD-10-CM | POA: Diagnosis not present

## 2017-10-19 DIAGNOSIS — M797 Fibromyalgia: Secondary | ICD-10-CM | POA: Diagnosis not present

## 2017-10-19 DIAGNOSIS — M069 Rheumatoid arthritis, unspecified: Secondary | ICD-10-CM | POA: Diagnosis not present

## 2017-10-19 DIAGNOSIS — G894 Chronic pain syndrome: Secondary | ICD-10-CM | POA: Diagnosis not present

## 2017-10-19 DIAGNOSIS — M5126 Other intervertebral disc displacement, lumbar region: Secondary | ICD-10-CM | POA: Diagnosis not present

## 2017-10-20 DIAGNOSIS — J329 Chronic sinusitis, unspecified: Secondary | ICD-10-CM | POA: Diagnosis not present

## 2017-10-20 DIAGNOSIS — J0110 Acute frontal sinusitis, unspecified: Secondary | ICD-10-CM | POA: Diagnosis not present

## 2017-11-03 DIAGNOSIS — F319 Bipolar disorder, unspecified: Secondary | ICD-10-CM | POA: Diagnosis not present

## 2017-11-03 DIAGNOSIS — M1990 Unspecified osteoarthritis, unspecified site: Secondary | ICD-10-CM | POA: Diagnosis not present

## 2017-11-03 DIAGNOSIS — F17210 Nicotine dependence, cigarettes, uncomplicated: Secondary | ICD-10-CM | POA: Diagnosis not present

## 2017-11-03 DIAGNOSIS — E119 Type 2 diabetes mellitus without complications: Secondary | ICD-10-CM | POA: Diagnosis not present

## 2017-11-03 DIAGNOSIS — M797 Fibromyalgia: Secondary | ICD-10-CM | POA: Diagnosis not present

## 2017-11-03 DIAGNOSIS — G8929 Other chronic pain: Secondary | ICD-10-CM | POA: Diagnosis not present

## 2017-11-03 DIAGNOSIS — M549 Dorsalgia, unspecified: Secondary | ICD-10-CM | POA: Diagnosis not present

## 2017-11-03 DIAGNOSIS — M545 Low back pain: Secondary | ICD-10-CM | POA: Diagnosis not present

## 2017-11-03 DIAGNOSIS — I10 Essential (primary) hypertension: Secondary | ICD-10-CM | POA: Diagnosis not present

## 2017-11-03 DIAGNOSIS — Z79890 Hormone replacement therapy (postmenopausal): Secondary | ICD-10-CM | POA: Diagnosis not present

## 2017-11-03 DIAGNOSIS — Z79899 Other long term (current) drug therapy: Secondary | ICD-10-CM | POA: Diagnosis not present

## 2017-11-03 DIAGNOSIS — S300XXA Contusion of lower back and pelvis, initial encounter: Secondary | ICD-10-CM | POA: Diagnosis not present

## 2017-11-03 DIAGNOSIS — Z791 Long term (current) use of non-steroidal anti-inflammatories (NSAID): Secondary | ICD-10-CM | POA: Diagnosis not present

## 2017-11-03 DIAGNOSIS — E785 Hyperlipidemia, unspecified: Secondary | ICD-10-CM | POA: Diagnosis not present

## 2017-11-03 DIAGNOSIS — M069 Rheumatoid arthritis, unspecified: Secondary | ICD-10-CM | POA: Diagnosis not present

## 2017-11-03 DIAGNOSIS — Z6838 Body mass index (BMI) 38.0-38.9, adult: Secondary | ICD-10-CM | POA: Diagnosis not present

## 2017-11-03 DIAGNOSIS — Z859 Personal history of malignant neoplasm, unspecified: Secondary | ICD-10-CM | POA: Diagnosis not present

## 2017-11-03 DIAGNOSIS — F419 Anxiety disorder, unspecified: Secondary | ICD-10-CM | POA: Diagnosis not present

## 2017-11-03 DIAGNOSIS — M8580 Other specified disorders of bone density and structure, unspecified site: Secondary | ICD-10-CM | POA: Diagnosis not present

## 2017-11-22 DIAGNOSIS — M5441 Lumbago with sciatica, right side: Secondary | ICD-10-CM | POA: Diagnosis not present

## 2017-11-22 DIAGNOSIS — G43909 Migraine, unspecified, not intractable, without status migrainosus: Secondary | ICD-10-CM | POA: Diagnosis not present

## 2017-11-22 DIAGNOSIS — M5442 Lumbago with sciatica, left side: Secondary | ICD-10-CM | POA: Diagnosis not present

## 2017-11-22 DIAGNOSIS — M5416 Radiculopathy, lumbar region: Secondary | ICD-10-CM | POA: Diagnosis not present

## 2017-11-22 DIAGNOSIS — M5440 Lumbago with sciatica, unspecified side: Secondary | ICD-10-CM | POA: Diagnosis not present

## 2017-11-22 DIAGNOSIS — G894 Chronic pain syndrome: Secondary | ICD-10-CM | POA: Diagnosis not present

## 2017-11-22 DIAGNOSIS — M797 Fibromyalgia: Secondary | ICD-10-CM | POA: Diagnosis not present

## 2017-11-22 DIAGNOSIS — F329 Major depressive disorder, single episode, unspecified: Secondary | ICD-10-CM | POA: Diagnosis not present

## 2017-11-22 DIAGNOSIS — M5126 Other intervertebral disc displacement, lumbar region: Secondary | ICD-10-CM | POA: Diagnosis not present

## 2017-11-22 DIAGNOSIS — M069 Rheumatoid arthritis, unspecified: Secondary | ICD-10-CM | POA: Diagnosis not present

## 2017-11-22 DIAGNOSIS — F319 Bipolar disorder, unspecified: Secondary | ICD-10-CM | POA: Diagnosis not present

## 2017-11-22 DIAGNOSIS — M25551 Pain in right hip: Secondary | ICD-10-CM | POA: Diagnosis not present

## 2017-12-07 DIAGNOSIS — F329 Major depressive disorder, single episode, unspecified: Secondary | ICD-10-CM | POA: Diagnosis not present

## 2017-12-07 DIAGNOSIS — G43909 Migraine, unspecified, not intractable, without status migrainosus: Secondary | ICD-10-CM | POA: Diagnosis not present

## 2017-12-07 DIAGNOSIS — G894 Chronic pain syndrome: Secondary | ICD-10-CM | POA: Diagnosis not present

## 2017-12-07 DIAGNOSIS — M797 Fibromyalgia: Secondary | ICD-10-CM | POA: Diagnosis not present

## 2017-12-07 DIAGNOSIS — F319 Bipolar disorder, unspecified: Secondary | ICD-10-CM | POA: Diagnosis not present

## 2017-12-07 DIAGNOSIS — M5416 Radiculopathy, lumbar region: Secondary | ICD-10-CM | POA: Diagnosis not present

## 2017-12-07 DIAGNOSIS — M069 Rheumatoid arthritis, unspecified: Secondary | ICD-10-CM | POA: Diagnosis not present

## 2017-12-07 DIAGNOSIS — M5126 Other intervertebral disc displacement, lumbar region: Secondary | ICD-10-CM | POA: Diagnosis not present

## 2017-12-07 DIAGNOSIS — M25551 Pain in right hip: Secondary | ICD-10-CM | POA: Diagnosis not present

## 2017-12-26 DIAGNOSIS — J209 Acute bronchitis, unspecified: Secondary | ICD-10-CM | POA: Diagnosis not present

## 2017-12-27 DIAGNOSIS — Z79890 Hormone replacement therapy (postmenopausal): Secondary | ICD-10-CM | POA: Diagnosis not present

## 2017-12-27 DIAGNOSIS — M797 Fibromyalgia: Secondary | ICD-10-CM | POA: Diagnosis not present

## 2017-12-27 DIAGNOSIS — F17210 Nicotine dependence, cigarettes, uncomplicated: Secondary | ICD-10-CM | POA: Diagnosis not present

## 2017-12-27 DIAGNOSIS — Z79899 Other long term (current) drug therapy: Secondary | ICD-10-CM | POA: Diagnosis not present

## 2017-12-27 DIAGNOSIS — M549 Dorsalgia, unspecified: Secondary | ICD-10-CM | POA: Diagnosis not present

## 2017-12-27 DIAGNOSIS — M8580 Other specified disorders of bone density and structure, unspecified site: Secondary | ICD-10-CM | POA: Diagnosis not present

## 2017-12-27 DIAGNOSIS — Z791 Long term (current) use of non-steroidal anti-inflammatories (NSAID): Secondary | ICD-10-CM | POA: Diagnosis not present

## 2017-12-27 DIAGNOSIS — I10 Essential (primary) hypertension: Secondary | ICD-10-CM | POA: Diagnosis not present

## 2017-12-27 DIAGNOSIS — M069 Rheumatoid arthritis, unspecified: Secondary | ICD-10-CM | POA: Diagnosis not present

## 2017-12-27 DIAGNOSIS — G8929 Other chronic pain: Secondary | ICD-10-CM | POA: Diagnosis not present

## 2017-12-27 DIAGNOSIS — E785 Hyperlipidemia, unspecified: Secondary | ICD-10-CM | POA: Diagnosis not present

## 2017-12-27 DIAGNOSIS — M1990 Unspecified osteoarthritis, unspecified site: Secondary | ICD-10-CM | POA: Diagnosis not present

## 2017-12-27 DIAGNOSIS — Z859 Personal history of malignant neoplasm, unspecified: Secondary | ICD-10-CM | POA: Diagnosis not present

## 2017-12-27 DIAGNOSIS — J441 Chronic obstructive pulmonary disease with (acute) exacerbation: Secondary | ICD-10-CM | POA: Diagnosis not present

## 2017-12-27 DIAGNOSIS — R05 Cough: Secondary | ICD-10-CM | POA: Diagnosis not present

## 2017-12-28 DIAGNOSIS — F17210 Nicotine dependence, cigarettes, uncomplicated: Secondary | ICD-10-CM | POA: Diagnosis not present

## 2017-12-28 DIAGNOSIS — S61012A Laceration without foreign body of left thumb without damage to nail, initial encounter: Secondary | ICD-10-CM | POA: Diagnosis not present

## 2017-12-28 DIAGNOSIS — M797 Fibromyalgia: Secondary | ICD-10-CM | POA: Diagnosis not present

## 2017-12-28 DIAGNOSIS — F419 Anxiety disorder, unspecified: Secondary | ICD-10-CM | POA: Diagnosis not present

## 2017-12-28 DIAGNOSIS — M1990 Unspecified osteoarthritis, unspecified site: Secondary | ICD-10-CM | POA: Diagnosis not present

## 2017-12-28 DIAGNOSIS — I10 Essential (primary) hypertension: Secondary | ICD-10-CM | POA: Diagnosis not present

## 2017-12-28 DIAGNOSIS — E785 Hyperlipidemia, unspecified: Secondary | ICD-10-CM | POA: Diagnosis not present

## 2017-12-28 DIAGNOSIS — F319 Bipolar disorder, unspecified: Secondary | ICD-10-CM | POA: Diagnosis not present

## 2018-01-08 DIAGNOSIS — M545 Low back pain: Secondary | ICD-10-CM | POA: Diagnosis not present

## 2018-01-08 DIAGNOSIS — M461 Sacroiliitis, not elsewhere classified: Secondary | ICD-10-CM | POA: Diagnosis not present

## 2018-01-08 DIAGNOSIS — G8929 Other chronic pain: Secondary | ICD-10-CM | POA: Diagnosis not present

## 2018-01-08 DIAGNOSIS — I10 Essential (primary) hypertension: Secondary | ICD-10-CM | POA: Diagnosis not present

## 2018-01-08 DIAGNOSIS — Z6841 Body Mass Index (BMI) 40.0 and over, adult: Secondary | ICD-10-CM | POA: Diagnosis not present

## 2018-01-18 DIAGNOSIS — M545 Low back pain: Secondary | ICD-10-CM | POA: Diagnosis not present

## 2018-01-18 DIAGNOSIS — R293 Abnormal posture: Secondary | ICD-10-CM | POA: Diagnosis not present

## 2018-01-18 DIAGNOSIS — R262 Difficulty in walking, not elsewhere classified: Secondary | ICD-10-CM | POA: Diagnosis not present

## 2018-01-18 DIAGNOSIS — M5417 Radiculopathy, lumbosacral region: Secondary | ICD-10-CM | POA: Diagnosis not present

## 2018-02-02 DIAGNOSIS — H66009 Acute suppurative otitis media without spontaneous rupture of ear drum, unspecified ear: Secondary | ICD-10-CM | POA: Diagnosis not present

## 2018-02-05 DIAGNOSIS — M5489 Other dorsalgia: Secondary | ICD-10-CM | POA: Diagnosis not present

## 2018-02-05 DIAGNOSIS — M5441 Lumbago with sciatica, right side: Secondary | ICD-10-CM | POA: Diagnosis not present

## 2018-02-08 DIAGNOSIS — M5489 Other dorsalgia: Secondary | ICD-10-CM | POA: Diagnosis not present

## 2018-02-08 DIAGNOSIS — M5441 Lumbago with sciatica, right side: Secondary | ICD-10-CM | POA: Diagnosis not present

## 2018-02-15 DIAGNOSIS — M5441 Lumbago with sciatica, right side: Secondary | ICD-10-CM | POA: Diagnosis not present

## 2018-02-15 DIAGNOSIS — M5489 Other dorsalgia: Secondary | ICD-10-CM | POA: Diagnosis not present

## 2018-02-19 DIAGNOSIS — M8580 Other specified disorders of bone density and structure, unspecified site: Secondary | ICD-10-CM | POA: Diagnosis not present

## 2018-02-19 DIAGNOSIS — Z79899 Other long term (current) drug therapy: Secondary | ICD-10-CM | POA: Diagnosis not present

## 2018-02-19 DIAGNOSIS — E785 Hyperlipidemia, unspecified: Secondary | ICD-10-CM | POA: Diagnosis not present

## 2018-02-19 DIAGNOSIS — M25532 Pain in left wrist: Secondary | ICD-10-CM | POA: Diagnosis not present

## 2018-02-19 DIAGNOSIS — M1990 Unspecified osteoarthritis, unspecified site: Secondary | ICD-10-CM | POA: Diagnosis not present

## 2018-02-19 DIAGNOSIS — Z859 Personal history of malignant neoplasm, unspecified: Secondary | ICD-10-CM | POA: Diagnosis not present

## 2018-02-19 DIAGNOSIS — F419 Anxiety disorder, unspecified: Secondary | ICD-10-CM | POA: Diagnosis not present

## 2018-02-19 DIAGNOSIS — S4992XA Unspecified injury of left shoulder and upper arm, initial encounter: Secondary | ICD-10-CM | POA: Diagnosis not present

## 2018-02-19 DIAGNOSIS — Z791 Long term (current) use of non-steroidal anti-inflammatories (NSAID): Secondary | ICD-10-CM | POA: Diagnosis not present

## 2018-02-19 DIAGNOSIS — M25512 Pain in left shoulder: Secondary | ICD-10-CM | POA: Diagnosis not present

## 2018-02-19 DIAGNOSIS — M797 Fibromyalgia: Secondary | ICD-10-CM | POA: Diagnosis not present

## 2018-02-19 DIAGNOSIS — F17210 Nicotine dependence, cigarettes, uncomplicated: Secondary | ICD-10-CM | POA: Diagnosis not present

## 2018-02-19 DIAGNOSIS — M549 Dorsalgia, unspecified: Secondary | ICD-10-CM | POA: Diagnosis not present

## 2018-02-19 DIAGNOSIS — M25562 Pain in left knee: Secondary | ICD-10-CM | POA: Diagnosis not present

## 2018-02-19 DIAGNOSIS — Z79890 Hormone replacement therapy (postmenopausal): Secondary | ICD-10-CM | POA: Diagnosis not present

## 2018-02-19 DIAGNOSIS — F319 Bipolar disorder, unspecified: Secondary | ICD-10-CM | POA: Diagnosis not present

## 2018-02-19 DIAGNOSIS — I10 Essential (primary) hypertension: Secondary | ICD-10-CM | POA: Diagnosis not present

## 2018-02-19 DIAGNOSIS — M069 Rheumatoid arthritis, unspecified: Secondary | ICD-10-CM | POA: Diagnosis not present

## 2018-02-19 DIAGNOSIS — G8929 Other chronic pain: Secondary | ICD-10-CM | POA: Diagnosis not present

## 2018-03-06 DIAGNOSIS — Z79899 Other long term (current) drug therapy: Secondary | ICD-10-CM | POA: Diagnosis not present

## 2018-03-06 DIAGNOSIS — J181 Lobar pneumonia, unspecified organism: Secondary | ICD-10-CM | POA: Diagnosis not present

## 2018-03-06 DIAGNOSIS — A419 Sepsis, unspecified organism: Secondary | ICD-10-CM | POA: Diagnosis not present

## 2018-03-06 DIAGNOSIS — J439 Emphysema, unspecified: Secondary | ICD-10-CM | POA: Diagnosis not present

## 2018-03-06 DIAGNOSIS — N179 Acute kidney failure, unspecified: Secondary | ICD-10-CM | POA: Diagnosis not present

## 2018-03-06 DIAGNOSIS — E785 Hyperlipidemia, unspecified: Secondary | ICD-10-CM | POA: Diagnosis not present

## 2018-03-06 DIAGNOSIS — I959 Hypotension, unspecified: Secondary | ICD-10-CM | POA: Diagnosis not present

## 2018-03-06 DIAGNOSIS — H9319 Tinnitus, unspecified ear: Secondary | ICD-10-CM | POA: Diagnosis not present

## 2018-03-06 DIAGNOSIS — I129 Hypertensive chronic kidney disease with stage 1 through stage 4 chronic kidney disease, or unspecified chronic kidney disease: Secondary | ICD-10-CM | POA: Diagnosis not present

## 2018-03-06 DIAGNOSIS — N183 Chronic kidney disease, stage 3 (moderate): Secondary | ICD-10-CM | POA: Diagnosis not present

## 2018-03-06 DIAGNOSIS — F319 Bipolar disorder, unspecified: Secondary | ICD-10-CM | POA: Diagnosis not present

## 2018-03-06 DIAGNOSIS — J189 Pneumonia, unspecified organism: Secondary | ICD-10-CM | POA: Diagnosis not present

## 2018-03-06 DIAGNOSIS — F17210 Nicotine dependence, cigarettes, uncomplicated: Secondary | ICD-10-CM | POA: Diagnosis not present

## 2018-03-06 DIAGNOSIS — R740 Nonspecific elevation of levels of transaminase and lactic acid dehydrogenase [LDH]: Secondary | ICD-10-CM | POA: Diagnosis not present

## 2018-03-06 DIAGNOSIS — R0602 Shortness of breath: Secondary | ICD-10-CM | POA: Diagnosis not present

## 2018-03-06 DIAGNOSIS — R1012 Left upper quadrant pain: Secondary | ICD-10-CM | POA: Diagnosis not present

## 2018-03-06 DIAGNOSIS — R42 Dizziness and giddiness: Secondary | ICD-10-CM | POA: Diagnosis not present

## 2018-03-06 DIAGNOSIS — E876 Hypokalemia: Secondary | ICD-10-CM | POA: Diagnosis not present

## 2018-03-06 DIAGNOSIS — R0789 Other chest pain: Secondary | ICD-10-CM | POA: Diagnosis not present

## 2018-03-06 DIAGNOSIS — H8149 Vertigo of central origin, unspecified ear: Secondary | ICD-10-CM | POA: Diagnosis not present

## 2018-03-06 DIAGNOSIS — R079 Chest pain, unspecified: Secondary | ICD-10-CM | POA: Diagnosis not present

## 2018-03-06 DIAGNOSIS — J432 Centrilobular emphysema: Secondary | ICD-10-CM | POA: Diagnosis not present

## 2018-03-06 DIAGNOSIS — M069 Rheumatoid arthritis, unspecified: Secondary | ICD-10-CM | POA: Diagnosis not present

## 2018-03-06 DIAGNOSIS — F419 Anxiety disorder, unspecified: Secondary | ICD-10-CM | POA: Diagnosis not present

## 2018-03-06 DIAGNOSIS — H6693 Otitis media, unspecified, bilateral: Secondary | ICD-10-CM | POA: Diagnosis not present

## 2018-03-06 DIAGNOSIS — E872 Acidosis: Secondary | ICD-10-CM | POA: Diagnosis not present

## 2018-03-06 DIAGNOSIS — K219 Gastro-esophageal reflux disease without esophagitis: Secondary | ICD-10-CM | POA: Diagnosis not present

## 2018-03-06 DIAGNOSIS — M797 Fibromyalgia: Secondary | ICD-10-CM | POA: Diagnosis not present

## 2018-03-06 DIAGNOSIS — K760 Fatty (change of) liver, not elsewhere classified: Secondary | ICD-10-CM | POA: Diagnosis not present

## 2018-03-07 DIAGNOSIS — R0602 Shortness of breath: Secondary | ICD-10-CM | POA: Diagnosis not present

## 2018-03-07 DIAGNOSIS — H9319 Tinnitus, unspecified ear: Secondary | ICD-10-CM | POA: Diagnosis not present

## 2018-03-07 DIAGNOSIS — H8149 Vertigo of central origin, unspecified ear: Secondary | ICD-10-CM | POA: Diagnosis not present

## 2018-03-07 DIAGNOSIS — R740 Nonspecific elevation of levels of transaminase and lactic acid dehydrogenase [LDH]: Secondary | ICD-10-CM | POA: Diagnosis not present

## 2018-03-07 DIAGNOSIS — R0789 Other chest pain: Secondary | ICD-10-CM | POA: Diagnosis not present

## 2018-03-15 DIAGNOSIS — I129 Hypertensive chronic kidney disease with stage 1 through stage 4 chronic kidney disease, or unspecified chronic kidney disease: Secondary | ICD-10-CM | POA: Diagnosis not present

## 2018-03-15 DIAGNOSIS — Z7984 Long term (current) use of oral hypoglycemic drugs: Secondary | ICD-10-CM | POA: Diagnosis not present

## 2018-03-15 DIAGNOSIS — R079 Chest pain, unspecified: Secondary | ICD-10-CM | POA: Diagnosis not present

## 2018-03-15 DIAGNOSIS — F3170 Bipolar disorder, currently in remission, most recent episode unspecified: Secondary | ICD-10-CM | POA: Diagnosis not present

## 2018-03-15 DIAGNOSIS — N179 Acute kidney failure, unspecified: Secondary | ICD-10-CM | POA: Diagnosis not present

## 2018-03-15 DIAGNOSIS — Z8541 Personal history of malignant neoplasm of cervix uteri: Secondary | ICD-10-CM | POA: Diagnosis not present

## 2018-03-15 DIAGNOSIS — R112 Nausea with vomiting, unspecified: Secondary | ICD-10-CM | POA: Diagnosis not present

## 2018-03-15 DIAGNOSIS — N289 Disorder of kidney and ureter, unspecified: Secondary | ICD-10-CM | POA: Diagnosis not present

## 2018-03-15 DIAGNOSIS — N189 Chronic kidney disease, unspecified: Secondary | ICD-10-CM | POA: Diagnosis not present

## 2018-03-15 DIAGNOSIS — E876 Hypokalemia: Secondary | ICD-10-CM | POA: Diagnosis not present

## 2018-03-15 DIAGNOSIS — Z79899 Other long term (current) drug therapy: Secondary | ICD-10-CM | POA: Diagnosis not present

## 2018-03-15 DIAGNOSIS — E1122 Type 2 diabetes mellitus with diabetic chronic kidney disease: Secondary | ICD-10-CM | POA: Diagnosis not present

## 2018-03-15 DIAGNOSIS — J189 Pneumonia, unspecified organism: Secondary | ICD-10-CM | POA: Diagnosis not present

## 2018-03-15 DIAGNOSIS — F4312 Post-traumatic stress disorder, chronic: Secondary | ICD-10-CM | POA: Diagnosis not present

## 2018-03-15 DIAGNOSIS — K219 Gastro-esophageal reflux disease without esophagitis: Secondary | ICD-10-CM | POA: Diagnosis not present

## 2018-03-15 DIAGNOSIS — E785 Hyperlipidemia, unspecified: Secondary | ICD-10-CM | POA: Diagnosis not present

## 2018-03-15 DIAGNOSIS — M069 Rheumatoid arthritis, unspecified: Secondary | ICD-10-CM | POA: Diagnosis not present

## 2018-03-15 DIAGNOSIS — Z720 Tobacco use: Secondary | ICD-10-CM | POA: Diagnosis not present

## 2018-03-15 DIAGNOSIS — F419 Anxiety disorder, unspecified: Secondary | ICD-10-CM | POA: Diagnosis not present

## 2018-03-16 DIAGNOSIS — N179 Acute kidney failure, unspecified: Secondary | ICD-10-CM | POA: Diagnosis not present

## 2018-03-16 DIAGNOSIS — N189 Chronic kidney disease, unspecified: Secondary | ICD-10-CM | POA: Diagnosis not present

## 2018-03-20 DIAGNOSIS — Z8541 Personal history of malignant neoplasm of cervix uteri: Secondary | ICD-10-CM | POA: Diagnosis not present

## 2018-03-20 DIAGNOSIS — F319 Bipolar disorder, unspecified: Secondary | ICD-10-CM | POA: Diagnosis not present

## 2018-03-20 DIAGNOSIS — N189 Chronic kidney disease, unspecified: Secondary | ICD-10-CM | POA: Diagnosis not present

## 2018-03-20 DIAGNOSIS — G8929 Other chronic pain: Secondary | ICD-10-CM | POA: Diagnosis not present

## 2018-03-20 DIAGNOSIS — M8580 Other specified disorders of bone density and structure, unspecified site: Secondary | ICD-10-CM | POA: Diagnosis not present

## 2018-03-20 DIAGNOSIS — J322 Chronic ethmoidal sinusitis: Secondary | ICD-10-CM | POA: Diagnosis not present

## 2018-03-20 DIAGNOSIS — M1990 Unspecified osteoarthritis, unspecified site: Secondary | ICD-10-CM | POA: Diagnosis not present

## 2018-03-20 DIAGNOSIS — F17210 Nicotine dependence, cigarettes, uncomplicated: Secondary | ICD-10-CM | POA: Diagnosis not present

## 2018-03-20 DIAGNOSIS — E785 Hyperlipidemia, unspecified: Secondary | ICD-10-CM | POA: Diagnosis not present

## 2018-03-20 DIAGNOSIS — M797 Fibromyalgia: Secondary | ICD-10-CM | POA: Diagnosis not present

## 2018-03-20 DIAGNOSIS — M069 Rheumatoid arthritis, unspecified: Secondary | ICD-10-CM | POA: Diagnosis not present

## 2018-03-20 DIAGNOSIS — Z90710 Acquired absence of both cervix and uterus: Secondary | ICD-10-CM | POA: Diagnosis not present

## 2018-03-20 DIAGNOSIS — M549 Dorsalgia, unspecified: Secondary | ICD-10-CM | POA: Diagnosis not present

## 2018-03-20 DIAGNOSIS — R55 Syncope and collapse: Secondary | ICD-10-CM | POA: Diagnosis not present

## 2018-03-20 DIAGNOSIS — F419 Anxiety disorder, unspecified: Secondary | ICD-10-CM | POA: Diagnosis not present

## 2018-03-20 DIAGNOSIS — Z79899 Other long term (current) drug therapy: Secondary | ICD-10-CM | POA: Diagnosis not present

## 2018-03-20 DIAGNOSIS — R51 Headache: Secondary | ICD-10-CM | POA: Diagnosis not present

## 2018-03-20 DIAGNOSIS — E876 Hypokalemia: Secondary | ICD-10-CM | POA: Diagnosis not present

## 2018-03-20 DIAGNOSIS — I129 Hypertensive chronic kidney disease with stage 1 through stage 4 chronic kidney disease, or unspecified chronic kidney disease: Secondary | ICD-10-CM | POA: Diagnosis not present

## 2018-03-25 DIAGNOSIS — I10 Essential (primary) hypertension: Secondary | ICD-10-CM | POA: Diagnosis not present

## 2018-03-25 DIAGNOSIS — M797 Fibromyalgia: Secondary | ICD-10-CM | POA: Diagnosis not present

## 2018-03-25 DIAGNOSIS — F17210 Nicotine dependence, cigarettes, uncomplicated: Secondary | ICD-10-CM | POA: Diagnosis not present

## 2018-03-25 DIAGNOSIS — Z79899 Other long term (current) drug therapy: Secondary | ICD-10-CM | POA: Diagnosis not present

## 2018-03-25 DIAGNOSIS — F319 Bipolar disorder, unspecified: Secondary | ICD-10-CM | POA: Diagnosis not present

## 2018-03-25 DIAGNOSIS — M1990 Unspecified osteoarthritis, unspecified site: Secondary | ICD-10-CM | POA: Diagnosis not present

## 2018-03-25 DIAGNOSIS — R05 Cough: Secondary | ICD-10-CM | POA: Diagnosis not present

## 2018-03-25 DIAGNOSIS — E785 Hyperlipidemia, unspecified: Secondary | ICD-10-CM | POA: Diagnosis not present

## 2018-03-25 DIAGNOSIS — R079 Chest pain, unspecified: Secondary | ICD-10-CM | POA: Diagnosis not present

## 2018-03-25 DIAGNOSIS — J209 Acute bronchitis, unspecified: Secondary | ICD-10-CM | POA: Diagnosis not present

## 2018-03-26 DIAGNOSIS — R0602 Shortness of breath: Secondary | ICD-10-CM | POA: Diagnosis not present

## 2018-03-26 DIAGNOSIS — E785 Hyperlipidemia, unspecified: Secondary | ICD-10-CM | POA: Diagnosis not present

## 2018-03-26 DIAGNOSIS — I10 Essential (primary) hypertension: Secondary | ICD-10-CM | POA: Diagnosis not present

## 2018-03-26 DIAGNOSIS — G8929 Other chronic pain: Secondary | ICD-10-CM | POA: Diagnosis not present

## 2018-03-26 DIAGNOSIS — M8580 Other specified disorders of bone density and structure, unspecified site: Secondary | ICD-10-CM | POA: Diagnosis not present

## 2018-03-26 DIAGNOSIS — F319 Bipolar disorder, unspecified: Secondary | ICD-10-CM | POA: Diagnosis not present

## 2018-03-26 DIAGNOSIS — M069 Rheumatoid arthritis, unspecified: Secondary | ICD-10-CM | POA: Diagnosis not present

## 2018-03-26 DIAGNOSIS — Z8541 Personal history of malignant neoplasm of cervix uteri: Secondary | ICD-10-CM | POA: Diagnosis not present

## 2018-03-26 DIAGNOSIS — J449 Chronic obstructive pulmonary disease, unspecified: Secondary | ICD-10-CM | POA: Diagnosis not present

## 2018-03-26 DIAGNOSIS — F17210 Nicotine dependence, cigarettes, uncomplicated: Secondary | ICD-10-CM | POA: Diagnosis not present

## 2018-03-26 DIAGNOSIS — M797 Fibromyalgia: Secondary | ICD-10-CM | POA: Diagnosis not present

## 2018-03-26 DIAGNOSIS — M549 Dorsalgia, unspecified: Secondary | ICD-10-CM | POA: Diagnosis not present

## 2018-03-26 DIAGNOSIS — J40 Bronchitis, not specified as acute or chronic: Secondary | ICD-10-CM | POA: Diagnosis not present

## 2018-03-26 DIAGNOSIS — M1990 Unspecified osteoarthritis, unspecified site: Secondary | ICD-10-CM | POA: Diagnosis not present

## 2018-03-28 DIAGNOSIS — D72829 Elevated white blood cell count, unspecified: Secondary | ICD-10-CM | POA: Diagnosis not present

## 2018-03-28 DIAGNOSIS — J209 Acute bronchitis, unspecified: Secondary | ICD-10-CM | POA: Diagnosis not present

## 2018-03-28 DIAGNOSIS — I10 Essential (primary) hypertension: Secondary | ICD-10-CM | POA: Diagnosis not present

## 2018-03-28 DIAGNOSIS — N179 Acute kidney failure, unspecified: Secondary | ICD-10-CM | POA: Diagnosis not present

## 2018-04-02 DIAGNOSIS — M797 Fibromyalgia: Secondary | ICD-10-CM | POA: Diagnosis not present

## 2018-04-02 DIAGNOSIS — R05 Cough: Secondary | ICD-10-CM | POA: Diagnosis not present

## 2018-04-02 DIAGNOSIS — M069 Rheumatoid arthritis, unspecified: Secondary | ICD-10-CM | POA: Diagnosis not present

## 2018-04-02 DIAGNOSIS — R55 Syncope and collapse: Secondary | ICD-10-CM | POA: Diagnosis not present

## 2018-04-02 DIAGNOSIS — I10 Essential (primary) hypertension: Secondary | ICD-10-CM | POA: Diagnosis not present

## 2018-04-02 DIAGNOSIS — F419 Anxiety disorder, unspecified: Secondary | ICD-10-CM | POA: Diagnosis not present

## 2018-04-02 DIAGNOSIS — R079 Chest pain, unspecified: Secondary | ICD-10-CM | POA: Diagnosis not present

## 2018-04-02 DIAGNOSIS — J441 Chronic obstructive pulmonary disease with (acute) exacerbation: Secondary | ICD-10-CM | POA: Diagnosis not present

## 2018-04-02 DIAGNOSIS — Z79899 Other long term (current) drug therapy: Secondary | ICD-10-CM | POA: Diagnosis not present

## 2018-04-02 DIAGNOSIS — E785 Hyperlipidemia, unspecified: Secondary | ICD-10-CM | POA: Diagnosis not present

## 2018-04-02 DIAGNOSIS — F319 Bipolar disorder, unspecified: Secondary | ICD-10-CM | POA: Diagnosis not present

## 2018-04-02 DIAGNOSIS — F17210 Nicotine dependence, cigarettes, uncomplicated: Secondary | ICD-10-CM | POA: Diagnosis not present

## 2018-04-04 DIAGNOSIS — G43909 Migraine, unspecified, not intractable, without status migrainosus: Secondary | ICD-10-CM | POA: Diagnosis not present

## 2018-04-04 DIAGNOSIS — R0789 Other chest pain: Secondary | ICD-10-CM | POA: Diagnosis not present

## 2018-04-04 DIAGNOSIS — R079 Chest pain, unspecified: Secondary | ICD-10-CM | POA: Diagnosis not present

## 2018-04-04 DIAGNOSIS — F17210 Nicotine dependence, cigarettes, uncomplicated: Secondary | ICD-10-CM | POA: Diagnosis not present

## 2018-04-04 DIAGNOSIS — R51 Headache: Secondary | ICD-10-CM | POA: Diagnosis not present

## 2018-04-04 DIAGNOSIS — R072 Precordial pain: Secondary | ICD-10-CM | POA: Diagnosis not present

## 2018-04-05 DIAGNOSIS — J449 Chronic obstructive pulmonary disease, unspecified: Secondary | ICD-10-CM | POA: Diagnosis not present

## 2018-04-05 DIAGNOSIS — R9431 Abnormal electrocardiogram [ECG] [EKG]: Secondary | ICD-10-CM | POA: Diagnosis not present

## 2018-04-05 DIAGNOSIS — R197 Diarrhea, unspecified: Secondary | ICD-10-CM | POA: Diagnosis not present

## 2018-04-06 DIAGNOSIS — H43813 Vitreous degeneration, bilateral: Secondary | ICD-10-CM | POA: Diagnosis not present

## 2018-04-06 DIAGNOSIS — H52221 Regular astigmatism, right eye: Secondary | ICD-10-CM | POA: Diagnosis not present

## 2018-04-06 DIAGNOSIS — H43393 Other vitreous opacities, bilateral: Secondary | ICD-10-CM | POA: Diagnosis not present

## 2018-04-06 DIAGNOSIS — H5213 Myopia, bilateral: Secondary | ICD-10-CM | POA: Diagnosis not present

## 2018-04-10 DIAGNOSIS — M47812 Spondylosis without myelopathy or radiculopathy, cervical region: Secondary | ICD-10-CM | POA: Diagnosis not present

## 2018-04-10 DIAGNOSIS — E785 Hyperlipidemia, unspecified: Secondary | ICD-10-CM | POA: Diagnosis not present

## 2018-04-10 DIAGNOSIS — I10 Essential (primary) hypertension: Secondary | ICD-10-CM | POA: Diagnosis not present

## 2018-04-10 DIAGNOSIS — M19011 Primary osteoarthritis, right shoulder: Secondary | ICD-10-CM | POA: Diagnosis not present

## 2018-04-10 DIAGNOSIS — M542 Cervicalgia: Secondary | ICD-10-CM | POA: Diagnosis not present

## 2018-04-10 DIAGNOSIS — I776 Arteritis, unspecified: Secondary | ICD-10-CM | POA: Diagnosis not present

## 2018-04-10 DIAGNOSIS — S161XXA Strain of muscle, fascia and tendon at neck level, initial encounter: Secondary | ICD-10-CM | POA: Diagnosis not present

## 2018-04-10 DIAGNOSIS — S0990XA Unspecified injury of head, initial encounter: Secondary | ICD-10-CM | POA: Diagnosis not present

## 2018-04-10 DIAGNOSIS — F319 Bipolar disorder, unspecified: Secondary | ICD-10-CM | POA: Diagnosis not present

## 2018-04-10 DIAGNOSIS — F419 Anxiety disorder, unspecified: Secondary | ICD-10-CM | POA: Diagnosis not present

## 2018-04-10 DIAGNOSIS — Z043 Encounter for examination and observation following other accident: Secondary | ICD-10-CM | POA: Diagnosis not present

## 2018-04-10 DIAGNOSIS — M797 Fibromyalgia: Secondary | ICD-10-CM | POA: Diagnosis not present

## 2018-04-10 DIAGNOSIS — Z79899 Other long term (current) drug therapy: Secondary | ICD-10-CM | POA: Diagnosis not present

## 2018-04-10 DIAGNOSIS — F17210 Nicotine dependence, cigarettes, uncomplicated: Secondary | ICD-10-CM | POA: Diagnosis not present

## 2018-04-10 DIAGNOSIS — R413 Other amnesia: Secondary | ICD-10-CM | POA: Diagnosis not present

## 2018-04-10 DIAGNOSIS — M25511 Pain in right shoulder: Secondary | ICD-10-CM | POA: Diagnosis not present

## 2018-04-10 DIAGNOSIS — S199XXA Unspecified injury of neck, initial encounter: Secondary | ICD-10-CM | POA: Diagnosis not present

## 2018-04-12 ENCOUNTER — Encounter: Payer: Self-pay | Admitting: Gastroenterology

## 2018-04-12 DIAGNOSIS — R0602 Shortness of breath: Secondary | ICD-10-CM | POA: Diagnosis not present

## 2018-04-12 DIAGNOSIS — R079 Chest pain, unspecified: Secondary | ICD-10-CM | POA: Diagnosis not present

## 2018-04-12 DIAGNOSIS — R9431 Abnormal electrocardiogram [ECG] [EKG]: Secondary | ICD-10-CM | POA: Diagnosis not present

## 2018-04-13 DIAGNOSIS — R079 Chest pain, unspecified: Secondary | ICD-10-CM | POA: Diagnosis not present

## 2018-04-18 DIAGNOSIS — J449 Chronic obstructive pulmonary disease, unspecified: Secondary | ICD-10-CM | POA: Diagnosis not present

## 2018-04-18 DIAGNOSIS — R05 Cough: Secondary | ICD-10-CM | POA: Diagnosis not present

## 2018-04-18 DIAGNOSIS — F17210 Nicotine dependence, cigarettes, uncomplicated: Secondary | ICD-10-CM | POA: Diagnosis not present

## 2018-04-27 DIAGNOSIS — Z79899 Other long term (current) drug therapy: Secondary | ICD-10-CM | POA: Diagnosis not present

## 2018-04-27 DIAGNOSIS — R791 Abnormal coagulation profile: Secondary | ICD-10-CM | POA: Diagnosis not present

## 2018-04-27 DIAGNOSIS — I10 Essential (primary) hypertension: Secondary | ICD-10-CM | POA: Diagnosis not present

## 2018-04-27 DIAGNOSIS — F319 Bipolar disorder, unspecified: Secondary | ICD-10-CM | POA: Diagnosis not present

## 2018-04-27 DIAGNOSIS — M7989 Other specified soft tissue disorders: Secondary | ICD-10-CM | POA: Diagnosis not present

## 2018-04-27 DIAGNOSIS — F17210 Nicotine dependence, cigarettes, uncomplicated: Secondary | ICD-10-CM | POA: Diagnosis not present

## 2018-04-27 DIAGNOSIS — Z8541 Personal history of malignant neoplasm of cervix uteri: Secondary | ICD-10-CM | POA: Diagnosis not present

## 2018-04-27 DIAGNOSIS — F419 Anxiety disorder, unspecified: Secondary | ICD-10-CM | POA: Diagnosis not present

## 2018-04-28 DIAGNOSIS — Z8541 Personal history of malignant neoplasm of cervix uteri: Secondary | ICD-10-CM | POA: Diagnosis not present

## 2018-04-28 DIAGNOSIS — F319 Bipolar disorder, unspecified: Secondary | ICD-10-CM | POA: Diagnosis not present

## 2018-04-28 DIAGNOSIS — M79604 Pain in right leg: Secondary | ICD-10-CM | POA: Diagnosis not present

## 2018-04-28 DIAGNOSIS — M7989 Other specified soft tissue disorders: Secondary | ICD-10-CM | POA: Diagnosis not present

## 2018-04-28 DIAGNOSIS — Z79899 Other long term (current) drug therapy: Secondary | ICD-10-CM | POA: Diagnosis not present

## 2018-04-28 DIAGNOSIS — M797 Fibromyalgia: Secondary | ICD-10-CM | POA: Diagnosis not present

## 2018-04-28 DIAGNOSIS — F17210 Nicotine dependence, cigarettes, uncomplicated: Secondary | ICD-10-CM | POA: Diagnosis not present

## 2018-04-28 DIAGNOSIS — I10 Essential (primary) hypertension: Secondary | ICD-10-CM | POA: Diagnosis not present

## 2018-04-28 DIAGNOSIS — M79605 Pain in left leg: Secondary | ICD-10-CM | POA: Diagnosis not present

## 2018-04-28 DIAGNOSIS — R609 Edema, unspecified: Secondary | ICD-10-CM | POA: Diagnosis not present

## 2018-04-28 DIAGNOSIS — E785 Hyperlipidemia, unspecified: Secondary | ICD-10-CM | POA: Diagnosis not present

## 2018-04-28 DIAGNOSIS — F419 Anxiety disorder, unspecified: Secondary | ICD-10-CM | POA: Diagnosis not present

## 2018-05-03 ENCOUNTER — Other Ambulatory Visit: Payer: Self-pay | Admitting: Gastroenterology

## 2018-05-05 DIAGNOSIS — R0602 Shortness of breath: Secondary | ICD-10-CM | POA: Diagnosis not present

## 2018-05-05 DIAGNOSIS — R918 Other nonspecific abnormal finding of lung field: Secondary | ICD-10-CM | POA: Diagnosis not present

## 2018-05-05 DIAGNOSIS — J449 Chronic obstructive pulmonary disease, unspecified: Secondary | ICD-10-CM | POA: Diagnosis not present

## 2018-05-05 DIAGNOSIS — R05 Cough: Secondary | ICD-10-CM | POA: Diagnosis not present

## 2018-05-14 ENCOUNTER — Encounter: Payer: Self-pay | Admitting: Gastroenterology

## 2018-05-15 DIAGNOSIS — R0602 Shortness of breath: Secondary | ICD-10-CM | POA: Diagnosis not present

## 2018-05-15 DIAGNOSIS — R079 Chest pain, unspecified: Secondary | ICD-10-CM | POA: Diagnosis not present

## 2018-05-15 DIAGNOSIS — R9431 Abnormal electrocardiogram [ECG] [EKG]: Secondary | ICD-10-CM | POA: Diagnosis not present

## 2018-05-22 ENCOUNTER — Ambulatory Visit (INDEPENDENT_AMBULATORY_CARE_PROVIDER_SITE_OTHER): Payer: Medicare Other | Admitting: Gastroenterology

## 2018-05-22 ENCOUNTER — Other Ambulatory Visit (INDEPENDENT_AMBULATORY_CARE_PROVIDER_SITE_OTHER): Payer: Medicare Other

## 2018-05-22 ENCOUNTER — Encounter

## 2018-05-22 ENCOUNTER — Encounter: Payer: Self-pay | Admitting: Gastroenterology

## 2018-05-22 VITALS — BP 110/82 | HR 80 | Ht 62.0 in | Wt 220.5 lb

## 2018-05-22 DIAGNOSIS — K219 Gastro-esophageal reflux disease without esophagitis: Secondary | ICD-10-CM

## 2018-05-22 DIAGNOSIS — R0602 Shortness of breath: Secondary | ICD-10-CM | POA: Diagnosis not present

## 2018-05-22 DIAGNOSIS — R1013 Epigastric pain: Secondary | ICD-10-CM

## 2018-05-22 DIAGNOSIS — R079 Chest pain, unspecified: Secondary | ICD-10-CM | POA: Diagnosis not present

## 2018-05-22 DIAGNOSIS — R9431 Abnormal electrocardiogram [ECG] [EKG]: Secondary | ICD-10-CM | POA: Diagnosis not present

## 2018-05-22 MED ORDER — DEXLANSOPRAZOLE 60 MG PO CPDR: 60.0000 mg | DELAYED_RELEASE_CAPSULE | Freq: Every day | ORAL | 0 refills | Status: DC

## 2018-05-22 NOTE — Patient Instructions (Addendum)
If you are age 43 or older, your body mass index should be between 23-30. Your Body mass index is 40.33 kg/m. If this is out of the aforementioned range listed, please consider follow up with your Primary Care Provider.  If you are age 12 or younger, your body mass index should be between 19-25. Your Body mass index is 40.33 kg/m. If this is out of the aformentioned range listed, please consider follow up with your Primary Care Provider.    You have been scheduled for an abdominal ultrasound at [facility] (1st floor [address] ) on 05/31/18 at [facility]. Please arrive 15 minutes prior to your appointment for registration. Make certain not to have anything to eat or drink 6 hours prior to your appointment. Should you need to reschedule your appointment, please contact radiology at [phone]. This test typically takes about 30 minutes to perform.  We have given you samples of the following medication to take: Dexilant 60 mg once daily.   Please stop at the lab before you leave the office today.   Thank you,  Dr. Jackquline Denmark

## 2018-05-22 NOTE — Progress Notes (Signed)
Chief Complaint: Abdominal pain  Referring Provider:  Dr Bea Graff      ASSESSMENT AND PLAN;   #1.  Epigastric pain.  D/d PUD, GERD, gastritis, nonulcer dyspepsia, gastroparesis, musculoskeletal etiology, doubt gallbladder or pancreatic problems. S/P chole 1999 #2.  GERD. #3.  N/V #4.  Fatty liver. -Dexilant 60 mg p.o. once a day -samples were given.  She can stop taking Protonix for now.  If she still has problems she will add Zantac 75 mg p.o. Nightly. -Proceed with a EGD for further evaluation. -Proceed with ultrasound of the abdomen complete. -Check CBC, CMP, lipase and TSH today. -Stop coffee, citrus drinks, sodas, chocolates, chewing gums and candy. -Stop smoking. -Do not eat 3 hours before going to sleep. -Can raise the head end of the bed by 6 inches blocks or bricks. -I have also encouraged patient to start exercising and try to reduce weight.    HPI:    Lacey Erickson is a 42 y.o. female  Epigastric pain for last few years but worse over the last 1 month Despite of protonix Describes this as persistent, associated with heartburn and regurgitation, no significant dysphagia or odynophagia.  Exacerbated with meals Occasional nausea/vomiting. Patient continues to smoke despite of medical advice No significant nonsteroidals Has gained weight Has been drinking 5 to 6 cups of coffee every day and few cans of soda. Has chronic looser bowel movements ever since cholecystectomy at the frequency of 2 to 3/day. Some associated abdominal bloating No melena or hematochezia  Had EGD 02/25/2016 which showed a small hiatal hernia, retained food in the stomach without any gastric outlet obstruction.  Negative small bowel biopsies for celiac.  Past Medical History:  Diagnosis Date  . Anxiety and depression   . Bipolar disorder ([place])   . Chronic respiratory failure ([place])   . Elevated LFTs   . Fibromyalgia   . GERD (gastroesophageal reflux disease)   . Hepatic steatosis   .  Hyperlipemia   .    Marland Kitchen RHA (rheumatoid arthritis) ([place])     Past Surgical History:  Procedure Laterality Date  . ABDOMINAL HYSTERECTOMY    . APPENDECTOMY    . BREAST LUMPECTOMY    . CHOLECYSTECTOMY    . ESOPHAGOGASTRODUODENOSCOPY  02/25/2016   Small hiatal hernia. Retained food in the stomach- no evidence of gastric outlet obstruction.     Family History  Problem Relation Age of Onset  . Colon cancer Neg Hx     Social History   Tobacco Use  . Smoking status: Current Every Day Smoker  . Smokeless tobacco: Never Used  Substance Use Topics  . Alcohol use: Not Currently  . Drug use: Never    Current Outpatient Medications  Medication Sig Dispense Refill  . albuterol (PROVENTIL HFA;VENTOLIN HFA) 108 (90 Base) MCG/ACT inhaler Inhale 2 puffs into the lungs every 6 (six) hours as needed.    Marland Kitchen amitriptyline (ELAVIL) 100 MG tablet Take 100 mg by mouth every 8 (eight) hours.    . chlorproMAZINE (THORAZINE) 25 MG tablet Take 75 mg by mouth at bedtime.    . hydrOXYzine (ATARAX/VISTARIL) 25 MG tablet Take 25 mg by mouth daily as needed.    . meclizine (ANTIVERT) 25 MG tablet Take 12.5 mg by mouth every 8 (eight) hours as needed.    . montelukast (SINGULAIR) 10 MG tablet Take 10 mg by mouth at bedtime.    . ondansetron (ZOFRAN) 4 MG tablet Take 4 mg by mouth daily as needed.    Marland Kitchen  pantoprazole (PROTONIX) 40 MG tablet Take 40 mg by mouth daily.    . prazosin (MINIPRESS) 1 MG capsule Take 1 mg by mouth at bedtime.    . sertraline (ZOLOFT) 50 MG tablet Take 50 mg by mouth at bedtime.     No current facility-administered medications for this visit.     Allergies  Allergen Reactions  . Plaquenil [Hydroxychloroquine]     alopecia  . Armodafinil Palpitations    Review of Systems:  Constitutional: Denies fever, chills, diaphoresis, appetite change, has fatigue.  HEENT: Denies photophobia, eye pain, redness, hearing loss, ear pain, congestion, sore throat, rhinorrhea, sneezing, mouth  sores, neck pain, neck stiffness and tinnitus.   Respiratory: Has some SOB, no DOE, cough, chest tightness,  and wheezing.   Cardiovascular: Denies chest pain, palpitations and leg swelling.  Genitourinary: Denies dysuria, has occ urgency, frequency. No flank pain and difficulty urinating.  Musculoskeletal: has myalgias, back pain, joint swelling, arthralgias and gait problem.  Skin: No rash.  Neurological: Denies dizziness, seizures, syncope, weakness, light-headedness, numbness and headaches.  Hematological: Denies adenopathy. Easy bruising, personal or family bleeding history  Psychiatric/Behavioral: Has anxiety or depression     Physical Exam:    BP 110/82   Pulse 80   Ht 5\' 2"  (1.575 m)   Wt 220 lb 8 oz (100 kg)   BMI 40.33 kg/m  Filed Weights   05/22/18 1350  Weight: 220 lb 8 oz (100 kg)   Constitutional:  Well-developed, in no acute distress. Psychiatric: Normal mood and affect. Behavior is normal. HEENT: Pupils normal.  Conjunctivae are normal. No scleral icterus. Neck supple.  Cardiovascular: Normal rate, regular rhythm. No edema Pulmonary/chest: Effort normal and breath sounds normal. No wheezing, rales or rhonchi. Abdominal: Soft, nondistended.  Mild epigastric tender. Bowel sounds active throughout. There are no masses palpable. No hepatomegaly. Rectal:  defered Neurological: Alert and oriented to person place and time. Skin: Skin is warm and dry. No rashes noted. Seen in presence of Pt's husband.   Carmell Austria, MD 05/22/2018, 2:04 PM  Cc: Dr Bea Graff

## 2018-05-23 DIAGNOSIS — F17200 Nicotine dependence, unspecified, uncomplicated: Secondary | ICD-10-CM | POA: Diagnosis not present

## 2018-05-23 DIAGNOSIS — I10 Essential (primary) hypertension: Secondary | ICD-10-CM | POA: Diagnosis not present

## 2018-05-23 DIAGNOSIS — R5381 Other malaise: Secondary | ICD-10-CM | POA: Diagnosis not present

## 2018-05-23 DIAGNOSIS — F315 Bipolar disorder, current episode depressed, severe, with psychotic features: Secondary | ICD-10-CM | POA: Diagnosis not present

## 2018-05-23 DIAGNOSIS — K449 Diaphragmatic hernia without obstruction or gangrene: Secondary | ICD-10-CM | POA: Diagnosis not present

## 2018-05-23 DIAGNOSIS — E119 Type 2 diabetes mellitus without complications: Secondary | ICD-10-CM | POA: Diagnosis not present

## 2018-05-23 DIAGNOSIS — J42 Unspecified chronic bronchitis: Secondary | ICD-10-CM | POA: Diagnosis not present

## 2018-05-23 DIAGNOSIS — E782 Mixed hyperlipidemia: Secondary | ICD-10-CM | POA: Diagnosis not present

## 2018-05-23 DIAGNOSIS — M05771 Rheumatoid arthritis with rheumatoid factor of right ankle and foot without organ or systems involvement: Secondary | ICD-10-CM | POA: Diagnosis not present

## 2018-05-23 DIAGNOSIS — R42 Dizziness and giddiness: Secondary | ICD-10-CM | POA: Diagnosis not present

## 2018-05-23 DIAGNOSIS — K219 Gastro-esophageal reflux disease without esophagitis: Secondary | ICD-10-CM | POA: Diagnosis not present

## 2018-05-23 LAB — TSH: TSH: 1.09 u[IU]/mL (ref 0.35–4.50)

## 2018-05-23 LAB — COMPREHENSIVE METABOLIC PANEL
ALK PHOS: 105 U/L (ref 39–117)
ALT: 27 U/L (ref 0–35)
AST: 23 U/L (ref 0–37)
Albumin: 4.2 g/dL (ref 3.5–5.2)
BUN: 13 mg/dL (ref 6–23)
CHLORIDE: 102 meq/L (ref 96–112)
CO2: 27 meq/L (ref 19–32)
Calcium: 10.1 mg/dL (ref 8.4–10.5)
Creatinine, Ser: 1.12 mg/dL (ref 0.40–1.20)
GFR: 56.47 mL/min — ABNORMAL LOW (ref >60.00)
GLUCOSE: 115 mg/dL — AB (ref 70–99)
POTASSIUM: 4.3 meq/L (ref 3.5–5.1)
SODIUM: 141 meq/L (ref 135–145)
Total Bilirubin: 0.4 mg/dL (ref 0.2–1.2)
Total Protein: 7.3 g/dL (ref 6.0–8.3)

## 2018-05-23 LAB — CBC WITH DIFFERENTIAL/PLATELET
BASOS PCT: 0.5 % (ref 0.0–3.0)
Basophils Absolute: 0.1 10*3/uL (ref 0.0–0.1)
EOS PCT: 3.6 % (ref 0.0–5.0)
Eosinophils Absolute: 0.4 10*3/uL (ref 0.0–0.7)
HCT: 42.8 % (ref 36.0–46.0)
Hemoglobin: 14.2 g/dL (ref 12.0–15.0)
LYMPHS ABS: 3.5 10*3/uL (ref 0.7–4.0)
Lymphocytes Relative: 33.2 % (ref 12.0–46.0)
MCHC: 33.2 g/dL (ref 30.0–36.0)
MCV: 85 fl (ref 78.0–100.0)
MONOS PCT: 5.5 % (ref 3.0–12.0)
Monocytes Absolute: 0.6 10*3/uL (ref 0.1–1.0)
NEUTROS ABS: 6 10*3/uL (ref 1.4–7.7)
NEUTROS PCT: 57.2 % (ref 43.0–77.0)
PLATELETS: 334 10*3/uL (ref 150.0–400.0)
RBC: 5.03 Mil/uL (ref 3.87–5.11)
RDW: 16.2 % — ABNORMAL HIGH (ref 11.5–15.5)
WBC: 10.4 10*3/uL (ref 4.0–10.5)

## 2018-05-23 LAB — LIPASE: LIPASE: 15 U/L (ref 11.0–59.0)

## 2018-05-31 ENCOUNTER — Ambulatory Visit (HOSPITAL_BASED_OUTPATIENT_CLINIC_OR_DEPARTMENT_OTHER): Admission: RE | Admit: 2018-05-31 | Payer: Medicare Other | Source: Ambulatory Visit

## 2018-06-04 ENCOUNTER — Other Ambulatory Visit: Payer: Self-pay | Admitting: Gastroenterology

## 2018-06-05 ENCOUNTER — Encounter (HOSPITAL_BASED_OUTPATIENT_CLINIC_OR_DEPARTMENT_OTHER): Payer: Self-pay

## 2018-06-05 ENCOUNTER — Ambulatory Visit (HOSPITAL_BASED_OUTPATIENT_CLINIC_OR_DEPARTMENT_OTHER)
Admission: RE | Admit: 2018-06-05 | Discharge: 2018-06-05 | Disposition: A | Discharge status: Home / Self Care | Payer: Medicare Other | Source: Ambulatory Visit | Attending: Gastroenterology | Admitting: Gastroenterology

## 2018-06-05 DIAGNOSIS — K7689 Other specified diseases of liver: Secondary | ICD-10-CM | POA: Diagnosis not present

## 2018-06-05 DIAGNOSIS — K219 Gastro-esophageal reflux disease without esophagitis: Secondary | ICD-10-CM | POA: Diagnosis not present

## 2018-06-05 DIAGNOSIS — R1013 Epigastric pain: Secondary | ICD-10-CM | POA: Insufficient documentation

## 2018-06-05 DIAGNOSIS — Z9049 Acquired absence of other specified parts of digestive tract: Secondary | ICD-10-CM | POA: Diagnosis not present

## 2018-06-05 HISTORY — DX: Other specified disorders of bone density and structure, unspecified site: M85.80

## 2018-06-05 HISTORY — DX: Malignant neoplasm of cervix uteri, unspecified: C53.9

## 2018-06-06 ENCOUNTER — Ambulatory Visit (AMBULATORY_SURGERY_CENTER): Payer: Medicare Other | Admitting: Gastroenterology

## 2018-06-06 ENCOUNTER — Encounter: Payer: Self-pay | Admitting: Gastroenterology

## 2018-06-06 VITALS — BP 142/64 | HR 79 | Temp 98.4°F | Resp 12 | Ht 62.0 in | Wt 220.5 lb

## 2018-06-06 DIAGNOSIS — R1013 Epigastric pain: Secondary | ICD-10-CM | POA: Diagnosis not present

## 2018-06-06 DIAGNOSIS — K2970 Gastritis, unspecified, without bleeding: Secondary | ICD-10-CM | POA: Diagnosis not present

## 2018-06-06 DIAGNOSIS — K219 Gastro-esophageal reflux disease without esophagitis: Secondary | ICD-10-CM

## 2018-06-06 DIAGNOSIS — K2950 Unspecified chronic gastritis without bleeding: Secondary | ICD-10-CM | POA: Diagnosis not present

## 2018-06-06 DIAGNOSIS — K209 Esophagitis, unspecified: Secondary | ICD-10-CM | POA: Diagnosis not present

## 2018-06-06 MED ORDER — PANTOPRAZOLE SODIUM 40 MG PO TBEC: 40.0000 mg | DELAYED_RELEASE_TABLET | Freq: Every day | ORAL | 6 refills | Status: DC

## 2018-06-06 MED ORDER — RANITIDINE HCL 150 MG PO TABS: 150.0000 mg | ORAL_TABLET | Freq: Every evening | ORAL | 6 refills | Status: DC | PRN

## 2018-06-06 MED ORDER — ONDANSETRON HCL 4 MG PO TABS: 4.0000 mg | ORAL_TABLET | Freq: Four times a day (QID) | ORAL | 2 refills | Status: DC | PRN

## 2018-06-06 MED ORDER — SODIUM CHLORIDE 0.9 % IV SOLN: 500.0000 mL | Freq: Once | INTRAVENOUS | Status: DC

## 2018-06-06 NOTE — Op Note (Signed)
[place] Patient Name: Lacey Erickson Procedure Date: 06/06/2018 9:34 AM MRN: [id number] Endoscopist: Jackquline Denmark , MD Age: 42 Referring MD:  Date of Birth: [birth year] Gender: Female Account #: [id number] Procedure:                Upper GI endoscopy Indications:              Epigastric abdominal pain, GERD. Medicines:                Monitored Anesthesia Care Procedure:                Pre-Anesthesia Assessment:                           - Prior to the procedure, a History and Physical                            was performed, and patient medications and                            allergies were reviewed. The patient's tolerance of                            previous anesthesia was also reviewed. The risks                            and benefits of the procedure and the sedation                            options and risks were discussed with the patient.                            All questions were answered, and informed consent                            was obtained. Prior Anticoagulants: The patient has                            taken no previous anticoagulant or antiplatelet                            agents. ASA Grade Assessment: III - A patient with                            severe systemic disease. After reviewing the risks                            and benefits, the patient was deemed in                            satisfactory condition to undergo the procedure.                           After obtaining informed consent, the endoscope was  passed under direct vision. Throughout the                            procedure, the patient's blood pressure, pulse, and                            oxygen saturations were monitored continuously. The                            Model GIF-HQ190 [id number]) scope was introduced                            through the mouth, and advanced to the second part                            of duodenum. The  upper GI endoscopy was                            accomplished without difficulty. The patient                            tolerated the procedure well. Scope In: Scope Out: Findings:                 A small hiatal hernia was present. Biopsies were                            obtained from the proximal and distal esophagus                            with cold forceps for histology of suspected                            eosinophilic esophagitis.                           Localized mild inflammation characterized by                            erythema was found in the gastric antrum. Biopsies                            were taken with a cold forceps for histology.                            Retained food in the stomach without any outlet                            obstruction did limit the examination.                           The exam was otherwise without abnormality. Complications:            No immediate complications. Estimated Blood Loss:     Estimated blood loss: none. Impression:               -  Small hiatal hernia. Biopsied.                           - Gastritis. Biopsied.                           - Retained food. Recommendation:           - Patient has a contact number available for                            emergencies. The signs and symptoms of potential                            delayed complications were discussed with the                            patient. Return to normal activities tomorrow.                            Written discharge instructions were provided to the                            patient.                           - Resume previous diet.                           - Continue present medications. Continue Protonix                            40 mg by mouth every morning and Zantac 150 mg by                            mouth daily at bedtime. Would continue Zofran on                            as-needed basis.                           - Stop smoking.                            - Await pathology results.                           - Return to GI office in 12 weeks. If still with                            problems, will obtain a solid-phase gastric                            emptying scan. Jackquline Denmark, MD 06/06/2018 9:49:18 AM This report has been signed electronically.

## 2018-06-06 NOTE — Patient Instructions (Signed)
YOU HAD AN ENDOSCOPIC PROCEDURE TODAY AT [facility]:   Refer to the procedure report that was given to you for any specific questions about what was found during the examination.  If the procedure report does not answer your questions, please call your gastroenterologist to clarify.  If you requested that your care partner not be given the details of your procedure findings, then the procedure report has been included in a sealed envelope for you to review at your convenience later.  YOU SHOULD EXPECT: Some feelings of bloating in the abdomen. Passage of more gas than usual.  Walking can help get rid of the air that was put into your GI tract during the procedure and reduce the bloating. If you had a lower endoscopy (such as a colonoscopy or flexible sigmoidoscopy) you may notice spotting of blood in your stool or on the toilet paper. If you underwent a bowel prep for your procedure, you may not have a normal bowel movement for a few days.  Please Note:  You might notice some irritation and congestion in your nose or some drainage.  This is from the oxygen used during your procedure.  There is no need for concern and it should clear up in a day or so.  SYMPTOMS TO REPORT IMMEDIATELY:    Following upper endoscopy (EGD)  Vomiting of blood or coffee ground material  New chest pain or pain under the shoulder blades  Painful or persistently difficult swallowing  New shortness of breath  Fever of 100F or higher  Black, tarry-looking stools  For urgent or emergent issues, a gastroenterologist can be reached at any hour by calling [phone].   DIET:  We do recommend a small meal at first, but then you may proceed to your regular diet.  Drink plenty of fluids but you should avoid alcoholic beverages for 24 hours.  ACTIVITY:  You should plan to take it easy for the rest of today and you should NOT DRIVE or use heavy machinery until tomorrow (because of the sedation medicines used  during the test).    FOLLOW UP: Our staff will call the number listed on your records the next business day following your procedure to check on you and address any questions or concerns that you may have regarding the information given to you following your procedure. If we do not reach you, we will leave a message.  However, if you are feeling well and you are not experiencing any problems, there is no need to return our call.  We will assume that you have returned to your regular daily activities without incident.  If any biopsies were taken you will be contacted by phone or by letter within the next 1-3 weeks.  Please call us at [phone] if you have not heard about the biopsies in 3 weeks.    SIGNATURES/CONFIDENTIALITY: You and/or your care partner have signed paperwork which will be entered into your electronic medical record.  These signatures attest to the fact that that the information above on your After Visit Summary has been reviewed and is understood.  Full responsibility of the confidentiality of this discharge information lies with you and/or your care-partner.  See Dr. Lyndel Safe in 12 weeks.  Stop smoking.

## 2018-06-06 NOTE — Progress Notes (Signed)
Called to room to assist during endoscopic procedure.  Patient ID and intended procedure confirmed with present staff. Received instructions for my participation in the procedure from the performing physician.  

## 2018-06-06 NOTE — Progress Notes (Signed)
To PACU, VSS. Report to [email or web address] 

## 2018-06-07 ENCOUNTER — Telehealth: Payer: Self-pay | Admitting: *Deleted

## 2018-06-07 NOTE — Telephone Encounter (Signed)
  Follow up Call-  Call back number 06/06/2018  Post procedure Call Back phone  # [phone]  Permission to leave phone message Yes  Some recent data might be hidden     Patient questions:  Do you have a fever, pain , or abdominal swelling? No. Pain Score  0 *  Have you tolerated food without any problems? Yes.    Have you been able to return to your normal activities? Yes.    Do you have any questions about your discharge instructions: Diet   No. Medications  No. Follow up visit  No.  Do you have questions or concerns about your Care? No.  Actions: * If pain score is 4 or above: No action needed, pain <4.

## 2018-06-09 DIAGNOSIS — Z9049 Acquired absence of other specified parts of digestive tract: Secondary | ICD-10-CM | POA: Diagnosis not present

## 2018-06-09 DIAGNOSIS — Z79899 Other long term (current) drug therapy: Secondary | ICD-10-CM | POA: Diagnosis not present

## 2018-06-09 DIAGNOSIS — F319 Bipolar disorder, unspecified: Secondary | ICD-10-CM | POA: Diagnosis not present

## 2018-06-09 DIAGNOSIS — R1013 Epigastric pain: Secondary | ICD-10-CM | POA: Diagnosis not present

## 2018-06-09 DIAGNOSIS — F17210 Nicotine dependence, cigarettes, uncomplicated: Secondary | ICD-10-CM | POA: Diagnosis not present

## 2018-06-09 DIAGNOSIS — K760 Fatty (change of) liver, not elsewhere classified: Secondary | ICD-10-CM | POA: Diagnosis not present

## 2018-06-09 DIAGNOSIS — J432 Centrilobular emphysema: Secondary | ICD-10-CM | POA: Diagnosis not present

## 2018-06-09 DIAGNOSIS — M797 Fibromyalgia: Secondary | ICD-10-CM | POA: Diagnosis not present

## 2018-06-09 DIAGNOSIS — R1110 Vomiting, unspecified: Secondary | ICD-10-CM | POA: Diagnosis not present

## 2018-06-09 DIAGNOSIS — I10 Essential (primary) hypertension: Secondary | ICD-10-CM | POA: Diagnosis not present

## 2018-06-09 DIAGNOSIS — F419 Anxiety disorder, unspecified: Secondary | ICD-10-CM | POA: Diagnosis not present

## 2018-06-09 DIAGNOSIS — R112 Nausea with vomiting, unspecified: Secondary | ICD-10-CM | POA: Diagnosis not present

## 2018-06-09 DIAGNOSIS — R109 Unspecified abdominal pain: Secondary | ICD-10-CM | POA: Diagnosis not present

## 2018-06-09 DIAGNOSIS — M069 Rheumatoid arthritis, unspecified: Secondary | ICD-10-CM | POA: Diagnosis not present

## 2018-06-09 DIAGNOSIS — E785 Hyperlipidemia, unspecified: Secondary | ICD-10-CM | POA: Diagnosis not present

## 2018-06-09 DIAGNOSIS — Z90710 Acquired absence of both cervix and uterus: Secondary | ICD-10-CM | POA: Diagnosis not present

## 2018-06-09 DIAGNOSIS — K30 Functional dyspepsia: Secondary | ICD-10-CM | POA: Diagnosis not present

## 2018-06-10 DIAGNOSIS — J432 Centrilobular emphysema: Secondary | ICD-10-CM | POA: Diagnosis not present

## 2018-06-10 DIAGNOSIS — K30 Functional dyspepsia: Secondary | ICD-10-CM | POA: Diagnosis not present

## 2018-06-10 DIAGNOSIS — R109 Unspecified abdominal pain: Secondary | ICD-10-CM | POA: Diagnosis not present

## 2018-06-10 DIAGNOSIS — R1110 Vomiting, unspecified: Secondary | ICD-10-CM | POA: Diagnosis not present

## 2018-06-10 DIAGNOSIS — Z9049 Acquired absence of other specified parts of digestive tract: Secondary | ICD-10-CM | POA: Diagnosis not present

## 2018-06-10 DIAGNOSIS — K760 Fatty (change of) liver, not elsewhere classified: Secondary | ICD-10-CM | POA: Diagnosis not present

## 2018-06-10 DIAGNOSIS — Z90710 Acquired absence of both cervix and uterus: Secondary | ICD-10-CM | POA: Diagnosis not present

## 2018-06-13 DIAGNOSIS — E876 Hypokalemia: Secondary | ICD-10-CM | POA: Diagnosis not present

## 2018-06-13 DIAGNOSIS — R441 Visual hallucinations: Secondary | ICD-10-CM | POA: Diagnosis not present

## 2018-06-13 DIAGNOSIS — M797 Fibromyalgia: Secondary | ICD-10-CM | POA: Diagnosis not present

## 2018-06-13 DIAGNOSIS — F319 Bipolar disorder, unspecified: Secondary | ICD-10-CM | POA: Diagnosis not present

## 2018-06-13 DIAGNOSIS — F3181 Bipolar II disorder: Secondary | ICD-10-CM | POA: Diagnosis not present

## 2018-06-13 DIAGNOSIS — F17210 Nicotine dependence, cigarettes, uncomplicated: Secondary | ICD-10-CM | POA: Diagnosis not present

## 2018-06-13 DIAGNOSIS — R440 Auditory hallucinations: Secondary | ICD-10-CM | POA: Diagnosis not present

## 2018-06-13 DIAGNOSIS — Z79899 Other long term (current) drug therapy: Secondary | ICD-10-CM | POA: Diagnosis not present

## 2018-06-13 DIAGNOSIS — M069 Rheumatoid arthritis, unspecified: Secondary | ICD-10-CM | POA: Diagnosis not present

## 2018-06-13 DIAGNOSIS — M1990 Unspecified osteoarthritis, unspecified site: Secondary | ICD-10-CM | POA: Diagnosis not present

## 2018-06-13 DIAGNOSIS — E785 Hyperlipidemia, unspecified: Secondary | ICD-10-CM | POA: Diagnosis not present

## 2018-06-13 DIAGNOSIS — I10 Essential (primary) hypertension: Secondary | ICD-10-CM | POA: Diagnosis not present

## 2018-06-15 ENCOUNTER — Telehealth: Payer: Self-pay | Admitting: Gastroenterology

## 2018-06-15 DIAGNOSIS — F319 Bipolar disorder, unspecified: Secondary | ICD-10-CM | POA: Diagnosis not present

## 2018-06-15 NOTE — Telephone Encounter (Signed)
Patient wanting to know if results are back from procedure on 7.31.19

## 2018-06-15 NOTE — Telephone Encounter (Signed)
I discussed the results of her EGD with her.

## 2018-06-20 DIAGNOSIS — F319 Bipolar disorder, unspecified: Secondary | ICD-10-CM | POA: Diagnosis not present

## 2018-06-20 DIAGNOSIS — M797 Fibromyalgia: Secondary | ICD-10-CM | POA: Diagnosis not present

## 2018-06-20 DIAGNOSIS — E785 Hyperlipidemia, unspecified: Secondary | ICD-10-CM | POA: Diagnosis not present

## 2018-06-20 DIAGNOSIS — S40012A Contusion of left shoulder, initial encounter: Secondary | ICD-10-CM | POA: Diagnosis not present

## 2018-06-20 DIAGNOSIS — Z79899 Other long term (current) drug therapy: Secondary | ICD-10-CM | POA: Diagnosis not present

## 2018-06-20 DIAGNOSIS — M1990 Unspecified osteoarthritis, unspecified site: Secondary | ICD-10-CM | POA: Diagnosis not present

## 2018-06-20 DIAGNOSIS — R55 Syncope and collapse: Secondary | ICD-10-CM | POA: Diagnosis not present

## 2018-06-20 DIAGNOSIS — I10 Essential (primary) hypertension: Secondary | ICD-10-CM | POA: Diagnosis not present

## 2018-06-20 DIAGNOSIS — S46992A Other injury of unspecified muscle, fascia and tendon at shoulder and upper arm level, left arm, initial encounter: Secondary | ICD-10-CM | POA: Diagnosis not present

## 2018-06-20 DIAGNOSIS — S43005A Unspecified dislocation of left shoulder joint, initial encounter: Secondary | ICD-10-CM | POA: Diagnosis not present

## 2018-06-20 DIAGNOSIS — F419 Anxiety disorder, unspecified: Secondary | ICD-10-CM | POA: Diagnosis not present

## 2018-06-20 DIAGNOSIS — F17210 Nicotine dependence, cigarettes, uncomplicated: Secondary | ICD-10-CM | POA: Diagnosis not present

## 2018-06-20 DIAGNOSIS — R42 Dizziness and giddiness: Secondary | ICD-10-CM | POA: Diagnosis not present

## 2018-06-20 DIAGNOSIS — M069 Rheumatoid arthritis, unspecified: Secondary | ICD-10-CM | POA: Diagnosis not present

## 2018-06-21 ENCOUNTER — Encounter: Payer: Self-pay | Admitting: Gastroenterology

## 2018-07-03 DIAGNOSIS — F3132 Bipolar disorder, current episode depressed, moderate: Secondary | ICD-10-CM | POA: Diagnosis not present

## 2018-07-08 HISTORY — PX: BACK SURGERY: SHX140

## 2018-07-24 DIAGNOSIS — M545 Low back pain: Secondary | ICD-10-CM | POA: Diagnosis not present

## 2018-07-24 DIAGNOSIS — M542 Cervicalgia: Secondary | ICD-10-CM | POA: Diagnosis not present

## 2018-07-30 DIAGNOSIS — M549 Dorsalgia, unspecified: Secondary | ICD-10-CM | POA: Diagnosis not present

## 2018-07-30 DIAGNOSIS — I10 Essential (primary) hypertension: Secondary | ICD-10-CM | POA: Diagnosis not present

## 2018-07-30 DIAGNOSIS — F319 Bipolar disorder, unspecified: Secondary | ICD-10-CM | POA: Diagnosis not present

## 2018-07-30 DIAGNOSIS — F419 Anxiety disorder, unspecified: Secondary | ICD-10-CM | POA: Diagnosis not present

## 2018-07-30 DIAGNOSIS — S32009A Unspecified fracture of unspecified lumbar vertebra, initial encounter for closed fracture: Secondary | ICD-10-CM | POA: Diagnosis not present

## 2018-07-30 DIAGNOSIS — Z79899 Other long term (current) drug therapy: Secondary | ICD-10-CM | POA: Diagnosis not present

## 2018-07-30 DIAGNOSIS — S32010A Wedge compression fracture of first lumbar vertebra, initial encounter for closed fracture: Secondary | ICD-10-CM | POA: Diagnosis not present

## 2018-07-30 DIAGNOSIS — M797 Fibromyalgia: Secondary | ICD-10-CM | POA: Diagnosis not present

## 2018-07-30 DIAGNOSIS — Z8541 Personal history of malignant neoplasm of cervix uteri: Secondary | ICD-10-CM | POA: Diagnosis not present

## 2018-07-30 DIAGNOSIS — G8929 Other chronic pain: Secondary | ICD-10-CM | POA: Diagnosis not present

## 2018-07-30 DIAGNOSIS — Z9049 Acquired absence of other specified parts of digestive tract: Secondary | ICD-10-CM | POA: Diagnosis not present

## 2018-07-30 DIAGNOSIS — F17210 Nicotine dependence, cigarettes, uncomplicated: Secondary | ICD-10-CM | POA: Diagnosis not present

## 2018-07-31 DIAGNOSIS — S32010A Wedge compression fracture of first lumbar vertebra, initial encounter for closed fracture: Secondary | ICD-10-CM | POA: Diagnosis not present

## 2018-08-02 DIAGNOSIS — M545 Low back pain: Secondary | ICD-10-CM | POA: Diagnosis not present

## 2018-08-04 DIAGNOSIS — S32009A Unspecified fracture of unspecified lumbar vertebra, initial encounter for closed fracture: Secondary | ICD-10-CM | POA: Diagnosis not present

## 2018-08-04 DIAGNOSIS — M797 Fibromyalgia: Secondary | ICD-10-CM | POA: Diagnosis not present

## 2018-08-04 DIAGNOSIS — M069 Rheumatoid arthritis, unspecified: Secondary | ICD-10-CM | POA: Diagnosis not present

## 2018-08-04 DIAGNOSIS — F17210 Nicotine dependence, cigarettes, uncomplicated: Secondary | ICD-10-CM | POA: Diagnosis not present

## 2018-08-04 DIAGNOSIS — F419 Anxiety disorder, unspecified: Secondary | ICD-10-CM | POA: Diagnosis not present

## 2018-08-04 DIAGNOSIS — G8929 Other chronic pain: Secondary | ICD-10-CM | POA: Diagnosis not present

## 2018-08-04 DIAGNOSIS — I10 Essential (primary) hypertension: Secondary | ICD-10-CM | POA: Diagnosis not present

## 2018-08-04 DIAGNOSIS — M549 Dorsalgia, unspecified: Secondary | ICD-10-CM | POA: Diagnosis not present

## 2018-08-04 DIAGNOSIS — F319 Bipolar disorder, unspecified: Secondary | ICD-10-CM | POA: Diagnosis not present

## 2018-08-04 DIAGNOSIS — M4856XA Collapsed vertebra, not elsewhere classified, lumbar region, initial encounter for fracture: Secondary | ICD-10-CM | POA: Diagnosis not present

## 2018-08-06 IMAGING — US US ABDOMEN COMPLETE
1 series · 14 of 25 positions shown · non-contrast
Comparison: No prior.

CLINICAL DATA: Epigastric pain.

EXAM:
ABDOMEN ULTRASOUND COMPLETE

[Series 1: us abdomen complete · 0.29mm/px · 14 of 113 slices shown]
[im 1/113]
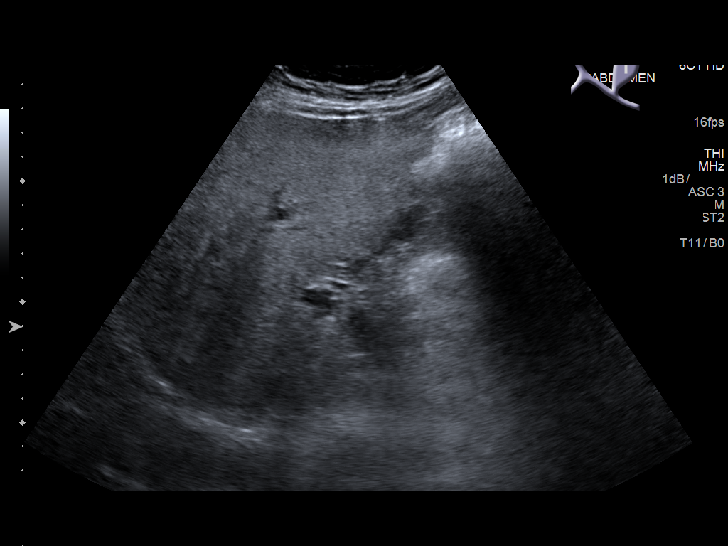
[im 10/113]
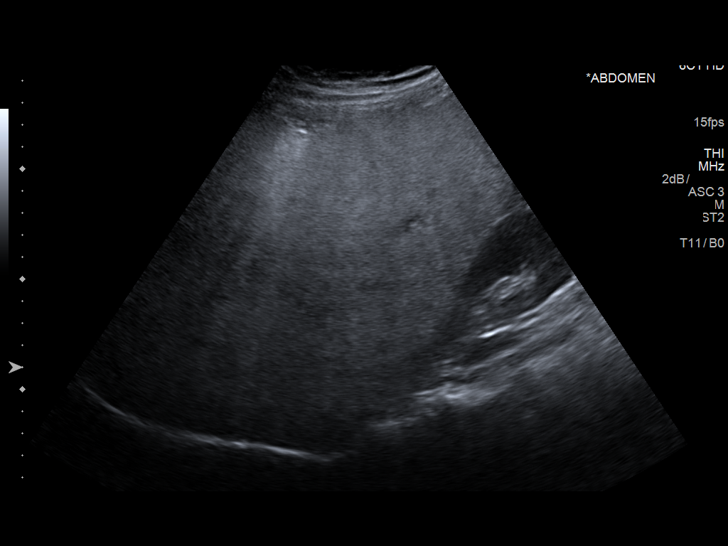
[im 19/113]
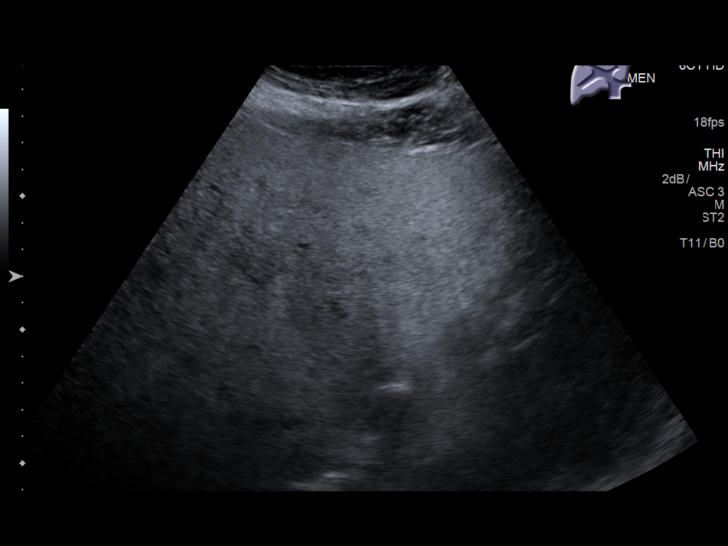
[im 29/113]
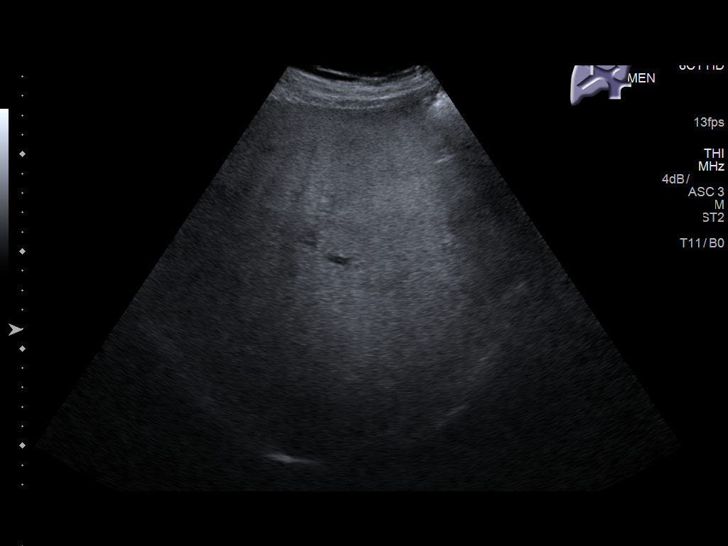
[im 38/113]
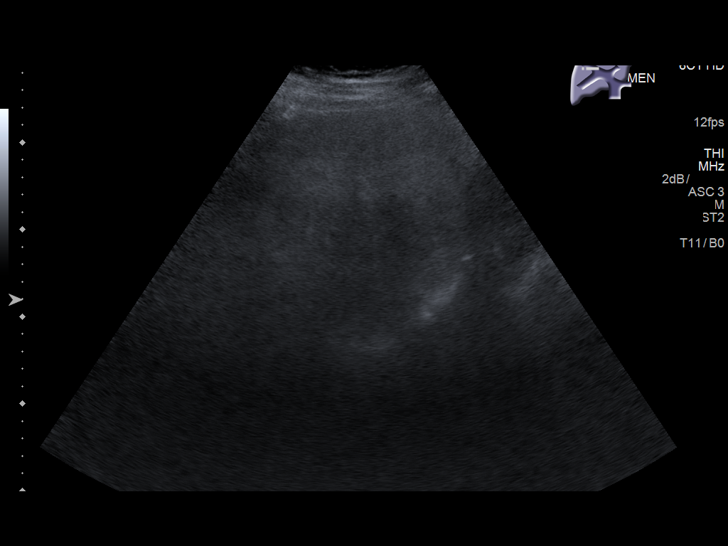
[im 43/113]
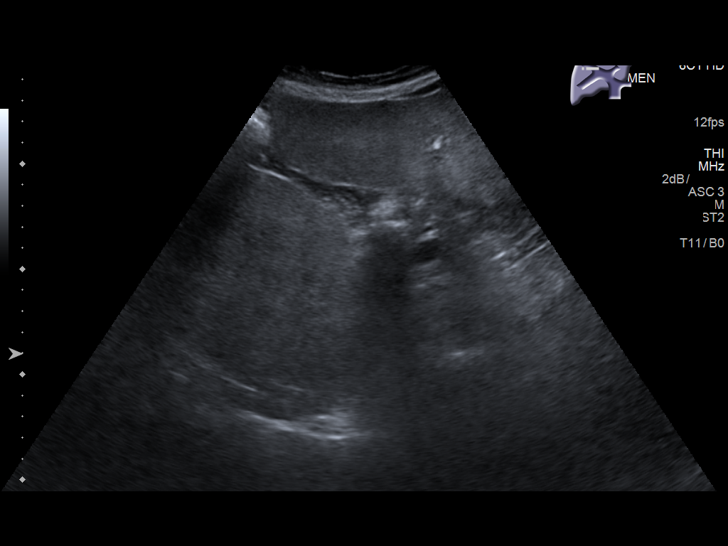
[im 52/113]
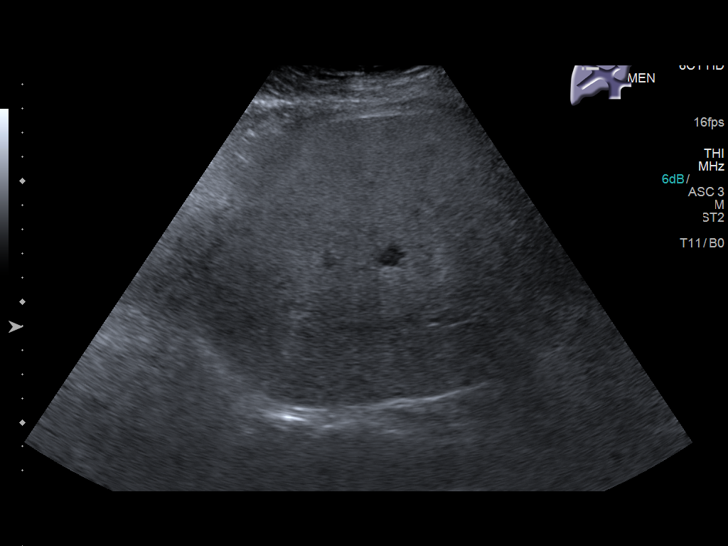
[im 61/113]
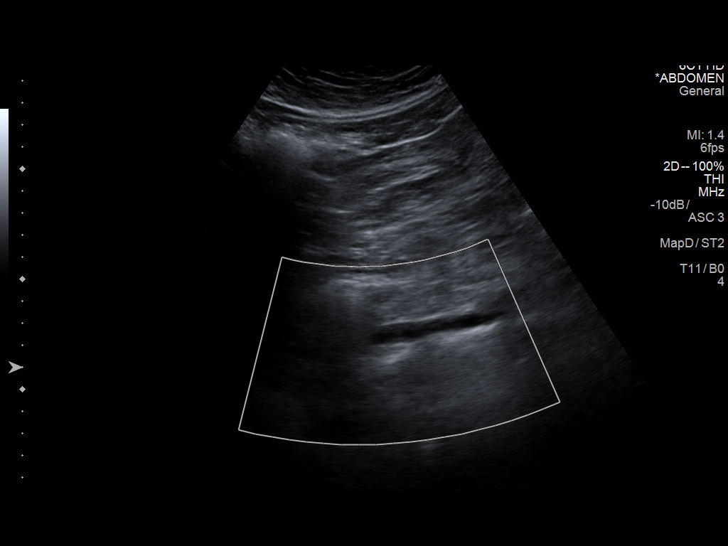
[im 71/113]
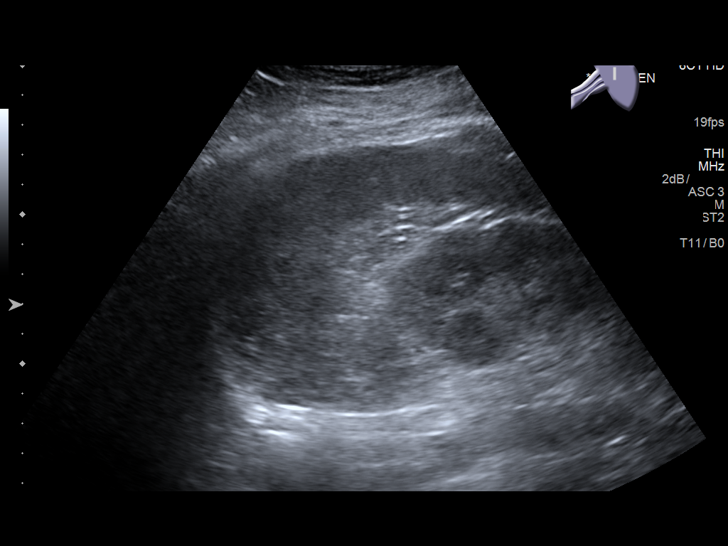
[im 75/113]
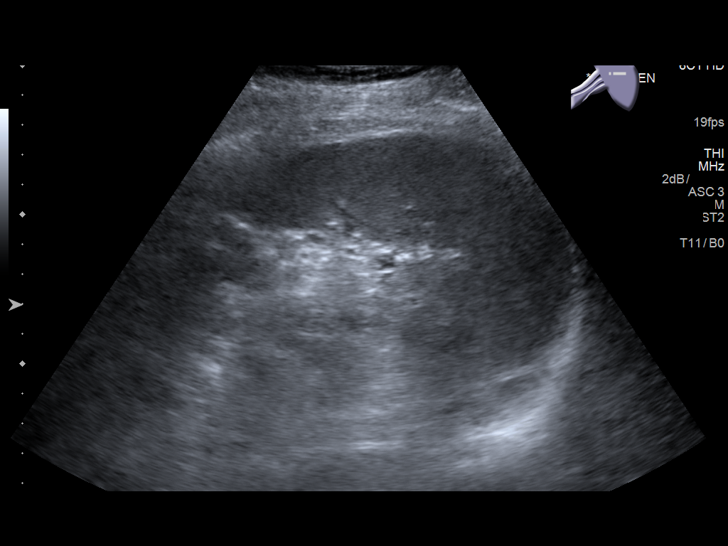
[im 85/113]
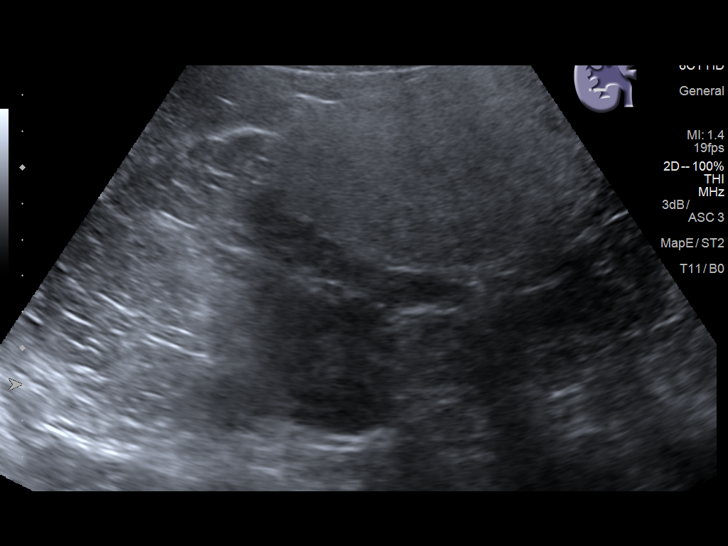
[im 94/113]
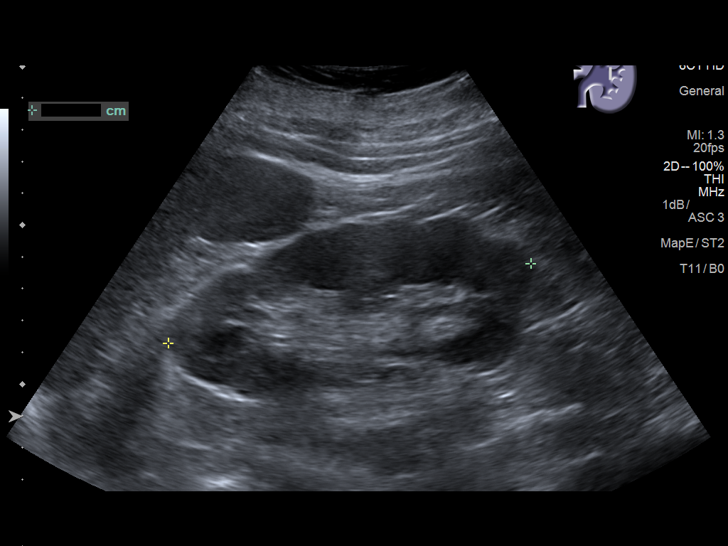
[im 103/113]
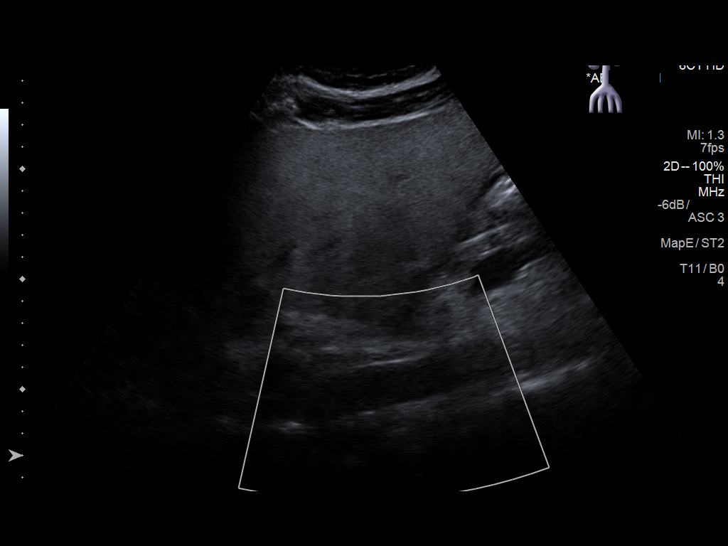
[im 113/113]
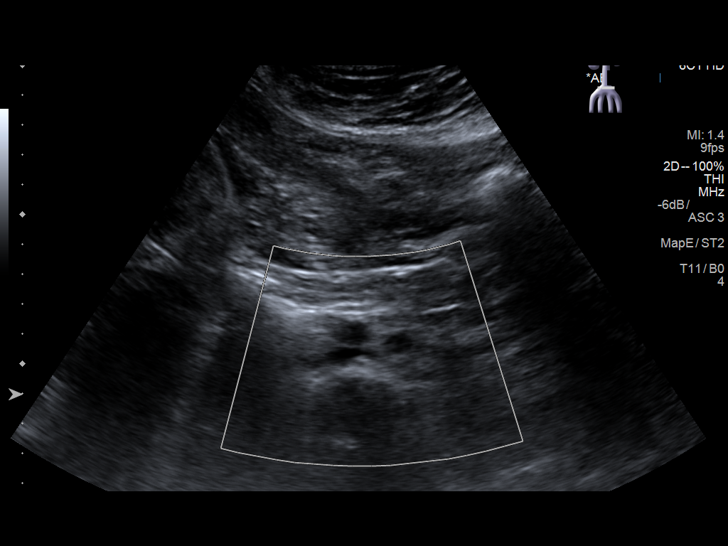

[14 of 25 positions shown; findings below may reference images not displayed]

FINDINGS: Gallbladder: Cholecystectomy.

Common bile duct: Diameter: 4.5 mm

Liver: Increased echogenicity consistent fatty infiltration or
hepatocellular disease. No focal hepatic abnormality identified.
Portal vein is patent on color Doppler imaging with normal direction
of blood flow towards the liver.

IVC: No abnormality visualized.

Pancreas: Visualized portion unremarkable.

Spleen: Size and appearance within normal limits.

Right Kidney: Length: 9.2 cm. Echogenicity within normal limits. No
mass or hydronephrosis visualized.

Left Kidney: Length: 11.7 cm. Echogenicity within normal limits. No
mass or hydronephrosis visualized.

Abdominal aorta: No aneurysm visualized.

Other findings: None.
IMPRESSION: 1.  Cholecystectomy.  No biliary distention.

2. Increased hepatic echogenicity consistent with fatty infiltration
and/or hepatocellular disease. Areas of focal fatty sparing noted.

## 2018-08-07 DIAGNOSIS — F17210 Nicotine dependence, cigarettes, uncomplicated: Secondary | ICD-10-CM | POA: Diagnosis not present

## 2018-08-07 DIAGNOSIS — S32010D Wedge compression fracture of first lumbar vertebra, subsequent encounter for fracture with routine healing: Secondary | ICD-10-CM | POA: Diagnosis not present

## 2018-08-07 DIAGNOSIS — M5416 Radiculopathy, lumbar region: Secondary | ICD-10-CM | POA: Diagnosis not present

## 2018-08-08 DIAGNOSIS — J42 Unspecified chronic bronchitis: Secondary | ICD-10-CM | POA: Diagnosis not present

## 2018-08-08 DIAGNOSIS — F17210 Nicotine dependence, cigarettes, uncomplicated: Secondary | ICD-10-CM | POA: Diagnosis not present

## 2018-08-08 DIAGNOSIS — J432 Centrilobular emphysema: Secondary | ICD-10-CM | POA: Diagnosis not present

## 2018-08-08 DIAGNOSIS — R911 Solitary pulmonary nodule: Secondary | ICD-10-CM | POA: Diagnosis not present

## 2018-08-13 DIAGNOSIS — F419 Anxiety disorder, unspecified: Secondary | ICD-10-CM | POA: Diagnosis not present

## 2018-08-13 DIAGNOSIS — F4312 Post-traumatic stress disorder, chronic: Secondary | ICD-10-CM | POA: Diagnosis not present

## 2018-08-13 DIAGNOSIS — F332 Major depressive disorder, recurrent severe without psychotic features: Secondary | ICD-10-CM | POA: Diagnosis not present

## 2018-08-16 DIAGNOSIS — J441 Chronic obstructive pulmonary disease with (acute) exacerbation: Secondary | ICD-10-CM | POA: Diagnosis not present

## 2018-08-16 DIAGNOSIS — F5101 Primary insomnia: Secondary | ICD-10-CM | POA: Diagnosis not present

## 2018-08-16 DIAGNOSIS — Z79899 Other long term (current) drug therapy: Secondary | ICD-10-CM | POA: Diagnosis not present

## 2018-08-16 DIAGNOSIS — R5383 Other fatigue: Secondary | ICD-10-CM | POA: Diagnosis not present

## 2018-08-16 DIAGNOSIS — E782 Mixed hyperlipidemia: Secondary | ICD-10-CM | POA: Diagnosis not present

## 2018-08-16 DIAGNOSIS — F17200 Nicotine dependence, unspecified, uncomplicated: Secondary | ICD-10-CM | POA: Diagnosis not present

## 2018-08-16 DIAGNOSIS — E1142 Type 2 diabetes mellitus with diabetic polyneuropathy: Secondary | ICD-10-CM | POA: Diagnosis not present

## 2018-08-16 DIAGNOSIS — R5381 Other malaise: Secondary | ICD-10-CM | POA: Diagnosis not present

## 2018-08-21 DIAGNOSIS — G43709 Chronic migraine without aura, not intractable, without status migrainosus: Secondary | ICD-10-CM | POA: Diagnosis not present

## 2018-08-23 DIAGNOSIS — R05 Cough: Secondary | ICD-10-CM | POA: Diagnosis not present

## 2018-08-23 DIAGNOSIS — J984 Other disorders of lung: Secondary | ICD-10-CM | POA: Diagnosis not present

## 2018-08-23 DIAGNOSIS — R911 Solitary pulmonary nodule: Secondary | ICD-10-CM | POA: Diagnosis not present

## 2018-08-23 DIAGNOSIS — R0602 Shortness of breath: Secondary | ICD-10-CM | POA: Diagnosis not present

## 2018-08-23 DIAGNOSIS — J449 Chronic obstructive pulmonary disease, unspecified: Secondary | ICD-10-CM | POA: Diagnosis not present

## 2018-08-23 DIAGNOSIS — R918 Other nonspecific abnormal finding of lung field: Secondary | ICD-10-CM | POA: Diagnosis not present

## 2018-08-25 DIAGNOSIS — M797 Fibromyalgia: Secondary | ICD-10-CM | POA: Diagnosis not present

## 2018-08-25 DIAGNOSIS — F419 Anxiety disorder, unspecified: Secondary | ICD-10-CM | POA: Diagnosis not present

## 2018-08-25 DIAGNOSIS — R42 Dizziness and giddiness: Secondary | ICD-10-CM | POA: Diagnosis not present

## 2018-08-25 DIAGNOSIS — M545 Low back pain: Secondary | ICD-10-CM | POA: Diagnosis not present

## 2018-08-25 DIAGNOSIS — I10 Essential (primary) hypertension: Secondary | ICD-10-CM | POA: Diagnosis not present

## 2018-08-25 DIAGNOSIS — G8929 Other chronic pain: Secondary | ICD-10-CM | POA: Diagnosis not present

## 2018-08-25 DIAGNOSIS — M4856XA Collapsed vertebra, not elsewhere classified, lumbar region, initial encounter for fracture: Secondary | ICD-10-CM | POA: Diagnosis not present

## 2018-08-25 DIAGNOSIS — Z79899 Other long term (current) drug therapy: Secondary | ICD-10-CM | POA: Diagnosis not present

## 2018-08-25 DIAGNOSIS — R32 Unspecified urinary incontinence: Secondary | ICD-10-CM | POA: Diagnosis not present

## 2018-08-25 DIAGNOSIS — F329 Major depressive disorder, single episode, unspecified: Secondary | ICD-10-CM | POA: Diagnosis not present

## 2018-08-25 DIAGNOSIS — S32010A Wedge compression fracture of first lumbar vertebra, initial encounter for closed fracture: Secondary | ICD-10-CM | POA: Diagnosis not present

## 2018-08-25 DIAGNOSIS — F17210 Nicotine dependence, cigarettes, uncomplicated: Secondary | ICD-10-CM | POA: Diagnosis not present

## 2018-08-25 DIAGNOSIS — M5416 Radiculopathy, lumbar region: Secondary | ICD-10-CM | POA: Diagnosis not present

## 2018-08-25 DIAGNOSIS — E785 Hyperlipidemia, unspecified: Secondary | ICD-10-CM | POA: Diagnosis not present

## 2018-08-25 DIAGNOSIS — M549 Dorsalgia, unspecified: Secondary | ICD-10-CM | POA: Diagnosis not present

## 2018-08-25 DIAGNOSIS — M069 Rheumatoid arthritis, unspecified: Secondary | ICD-10-CM | POA: Diagnosis not present

## 2018-08-28 DIAGNOSIS — S32010D Wedge compression fracture of first lumbar vertebra, subsequent encounter for fracture with routine healing: Secondary | ICD-10-CM | POA: Diagnosis not present

## 2018-08-30 DIAGNOSIS — G8929 Other chronic pain: Secondary | ICD-10-CM | POA: Diagnosis not present

## 2018-08-30 DIAGNOSIS — R079 Chest pain, unspecified: Secondary | ICD-10-CM | POA: Diagnosis not present

## 2018-09-03 DIAGNOSIS — R911 Solitary pulmonary nodule: Secondary | ICD-10-CM | POA: Diagnosis not present

## 2018-09-03 DIAGNOSIS — J432 Centrilobular emphysema: Secondary | ICD-10-CM | POA: Diagnosis not present

## 2018-09-04 DIAGNOSIS — R911 Solitary pulmonary nodule: Secondary | ICD-10-CM | POA: Diagnosis not present

## 2018-09-04 DIAGNOSIS — R05 Cough: Secondary | ICD-10-CM | POA: Diagnosis not present

## 2018-09-04 DIAGNOSIS — R918 Other nonspecific abnormal finding of lung field: Secondary | ICD-10-CM | POA: Diagnosis not present

## 2018-09-04 DIAGNOSIS — J984 Other disorders of lung: Secondary | ICD-10-CM | POA: Diagnosis not present

## 2018-09-11 DIAGNOSIS — S32010A Wedge compression fracture of first lumbar vertebra, initial encounter for closed fracture: Secondary | ICD-10-CM | POA: Diagnosis not present

## 2018-09-13 DIAGNOSIS — F17210 Nicotine dependence, cigarettes, uncomplicated: Secondary | ICD-10-CM | POA: Diagnosis not present

## 2018-09-13 DIAGNOSIS — F419 Anxiety disorder, unspecified: Secondary | ICD-10-CM | POA: Diagnosis not present

## 2018-09-13 DIAGNOSIS — G8911 Acute pain due to trauma: Secondary | ICD-10-CM | POA: Diagnosis not present

## 2018-09-13 DIAGNOSIS — M4856XA Collapsed vertebra, not elsewhere classified, lumbar region, initial encounter for fracture: Secondary | ICD-10-CM | POA: Diagnosis not present

## 2018-09-13 DIAGNOSIS — M069 Rheumatoid arthritis, unspecified: Secondary | ICD-10-CM | POA: Diagnosis not present

## 2018-09-13 DIAGNOSIS — Z79899 Other long term (current) drug therapy: Secondary | ICD-10-CM | POA: Diagnosis not present

## 2018-09-13 DIAGNOSIS — M545 Low back pain: Secondary | ICD-10-CM | POA: Diagnosis not present

## 2018-09-13 DIAGNOSIS — M549 Dorsalgia, unspecified: Secondary | ICD-10-CM | POA: Diagnosis not present

## 2018-09-13 DIAGNOSIS — I10 Essential (primary) hypertension: Secondary | ICD-10-CM | POA: Diagnosis not present

## 2018-09-13 DIAGNOSIS — F329 Major depressive disorder, single episode, unspecified: Secondary | ICD-10-CM | POA: Diagnosis not present

## 2018-09-13 DIAGNOSIS — Z8541 Personal history of malignant neoplasm of cervix uteri: Secondary | ICD-10-CM | POA: Diagnosis not present

## 2018-09-13 DIAGNOSIS — M797 Fibromyalgia: Secondary | ICD-10-CM | POA: Diagnosis not present

## 2018-09-15 DIAGNOSIS — R4020 Unspecified coma: Secondary | ICD-10-CM | POA: Diagnosis not present

## 2018-09-15 DIAGNOSIS — R55 Syncope and collapse: Secondary | ICD-10-CM | POA: Diagnosis not present

## 2018-09-15 DIAGNOSIS — R0902 Hypoxemia: Secondary | ICD-10-CM | POA: Diagnosis not present

## 2018-09-15 DIAGNOSIS — W19XXXA Unspecified fall, initial encounter: Secondary | ICD-10-CM | POA: Diagnosis not present

## 2018-09-16 DIAGNOSIS — G8929 Other chronic pain: Secondary | ICD-10-CM | POA: Diagnosis not present

## 2018-09-16 DIAGNOSIS — E7800 Pure hypercholesterolemia, unspecified: Secondary | ICD-10-CM | POA: Diagnosis not present

## 2018-09-16 DIAGNOSIS — R55 Syncope and collapse: Secondary | ICD-10-CM | POA: Diagnosis not present

## 2018-09-16 DIAGNOSIS — M1990 Unspecified osteoarthritis, unspecified site: Secondary | ICD-10-CM | POA: Diagnosis not present

## 2018-09-16 DIAGNOSIS — S3992XA Unspecified injury of lower back, initial encounter: Secondary | ICD-10-CM | POA: Diagnosis not present

## 2018-09-16 DIAGNOSIS — R7989 Other specified abnormal findings of blood chemistry: Secondary | ICD-10-CM | POA: Diagnosis not present

## 2018-09-16 DIAGNOSIS — M545 Low back pain: Secondary | ICD-10-CM | POA: Diagnosis not present

## 2018-09-16 DIAGNOSIS — M549 Dorsalgia, unspecified: Secondary | ICD-10-CM | POA: Diagnosis not present

## 2018-09-16 DIAGNOSIS — F319 Bipolar disorder, unspecified: Secondary | ICD-10-CM | POA: Diagnosis not present

## 2018-09-16 DIAGNOSIS — G43909 Migraine, unspecified, not intractable, without status migrainosus: Secondary | ICD-10-CM | POA: Diagnosis not present

## 2018-09-16 DIAGNOSIS — R9431 Abnormal electrocardiogram [ECG] [EKG]: Secondary | ICD-10-CM | POA: Diagnosis not present

## 2018-09-16 DIAGNOSIS — I10 Essential (primary) hypertension: Secondary | ICD-10-CM | POA: Diagnosis not present

## 2018-09-16 DIAGNOSIS — R51 Headache: Secondary | ICD-10-CM | POA: Diagnosis not present

## 2018-09-16 DIAGNOSIS — E785 Hyperlipidemia, unspecified: Secondary | ICD-10-CM | POA: Diagnosis not present

## 2018-09-16 DIAGNOSIS — Z8541 Personal history of malignant neoplasm of cervix uteri: Secondary | ICD-10-CM | POA: Diagnosis not present

## 2018-09-16 DIAGNOSIS — Z79899 Other long term (current) drug therapy: Secondary | ICD-10-CM | POA: Diagnosis not present

## 2018-09-16 DIAGNOSIS — F419 Anxiety disorder, unspecified: Secondary | ICD-10-CM | POA: Diagnosis not present

## 2018-09-16 DIAGNOSIS — M8580 Other specified disorders of bone density and structure, unspecified site: Secondary | ICD-10-CM | POA: Diagnosis not present

## 2018-09-16 DIAGNOSIS — I4581 Long QT syndrome: Secondary | ICD-10-CM | POA: Diagnosis not present

## 2018-09-16 DIAGNOSIS — Z8673 Personal history of transient ischemic attack (TIA), and cerebral infarction without residual deficits: Secondary | ICD-10-CM | POA: Diagnosis not present

## 2018-09-16 DIAGNOSIS — M797 Fibromyalgia: Secondary | ICD-10-CM | POA: Diagnosis not present

## 2018-09-16 DIAGNOSIS — F17210 Nicotine dependence, cigarettes, uncomplicated: Secondary | ICD-10-CM | POA: Diagnosis not present

## 2018-09-16 DIAGNOSIS — M069 Rheumatoid arthritis, unspecified: Secondary | ICD-10-CM | POA: Diagnosis not present

## 2018-09-16 DIAGNOSIS — I252 Old myocardial infarction: Secondary | ICD-10-CM | POA: Diagnosis not present

## 2018-09-16 DIAGNOSIS — K219 Gastro-esophageal reflux disease without esophagitis: Secondary | ICD-10-CM | POA: Diagnosis not present

## 2018-09-18 DIAGNOSIS — M5416 Radiculopathy, lumbar region: Secondary | ICD-10-CM | POA: Diagnosis not present

## 2018-09-18 DIAGNOSIS — Z9889 Other specified postprocedural states: Secondary | ICD-10-CM | POA: Diagnosis not present

## 2018-09-18 DIAGNOSIS — M62830 Muscle spasm of back: Secondary | ICD-10-CM | POA: Diagnosis not present

## 2018-09-20 DIAGNOSIS — M5116 Intervertebral disc disorders with radiculopathy, lumbar region: Secondary | ICD-10-CM | POA: Diagnosis not present

## 2018-09-20 DIAGNOSIS — Z9889 Other specified postprocedural states: Secondary | ICD-10-CM | POA: Diagnosis not present

## 2018-09-20 DIAGNOSIS — M62830 Muscle spasm of back: Secondary | ICD-10-CM | POA: Diagnosis not present

## 2018-09-20 DIAGNOSIS — M5114 Intervertebral disc disorders with radiculopathy, thoracic region: Secondary | ICD-10-CM | POA: Diagnosis not present

## 2018-09-20 DIAGNOSIS — M4856XA Collapsed vertebra, not elsewhere classified, lumbar region, initial encounter for fracture: Secondary | ICD-10-CM | POA: Diagnosis not present

## 2018-09-20 DIAGNOSIS — M5416 Radiculopathy, lumbar region: Secondary | ICD-10-CM | POA: Diagnosis not present

## 2018-09-25 DIAGNOSIS — F330 Major depressive disorder, recurrent, mild: Secondary | ICD-10-CM | POA: Diagnosis not present

## 2018-09-25 DIAGNOSIS — F4312 Post-traumatic stress disorder, chronic: Secondary | ICD-10-CM | POA: Diagnosis not present

## 2018-09-25 DIAGNOSIS — F419 Anxiety disorder, unspecified: Secondary | ICD-10-CM | POA: Diagnosis not present

## 2018-10-02 DIAGNOSIS — M5116 Intervertebral disc disorders with radiculopathy, lumbar region: Secondary | ICD-10-CM | POA: Diagnosis not present

## 2018-10-02 DIAGNOSIS — Z9889 Other specified postprocedural states: Secondary | ICD-10-CM | POA: Diagnosis not present

## 2018-10-10 DIAGNOSIS — M797 Fibromyalgia: Secondary | ICD-10-CM | POA: Diagnosis not present

## 2018-10-10 DIAGNOSIS — G8929 Other chronic pain: Secondary | ICD-10-CM | POA: Diagnosis not present

## 2018-10-10 DIAGNOSIS — M545 Low back pain: Secondary | ICD-10-CM | POA: Diagnosis not present

## 2018-10-10 DIAGNOSIS — Z9889 Other specified postprocedural states: Secondary | ICD-10-CM | POA: Diagnosis not present

## 2018-10-25 ENCOUNTER — Encounter: Payer: Self-pay | Admitting: Gastroenterology

## 2018-10-25 DIAGNOSIS — F17210 Nicotine dependence, cigarettes, uncomplicated: Secondary | ICD-10-CM | POA: Diagnosis not present

## 2018-10-25 DIAGNOSIS — K760 Fatty (change of) liver, not elsewhere classified: Secondary | ICD-10-CM | POA: Diagnosis not present

## 2018-10-25 DIAGNOSIS — R1032 Left lower quadrant pain: Secondary | ICD-10-CM | POA: Diagnosis not present

## 2018-11-01 DIAGNOSIS — G8929 Other chronic pain: Secondary | ICD-10-CM | POA: Diagnosis not present

## 2018-11-01 DIAGNOSIS — R079 Chest pain, unspecified: Secondary | ICD-10-CM | POA: Diagnosis not present

## 2018-11-01 DIAGNOSIS — K5733 Diverticulitis of large intestine without perforation or abscess with bleeding: Secondary | ICD-10-CM | POA: Diagnosis not present

## 2018-11-01 DIAGNOSIS — K219 Gastro-esophageal reflux disease without esophagitis: Secondary | ICD-10-CM | POA: Diagnosis not present

## 2018-11-02 DIAGNOSIS — I517 Cardiomegaly: Secondary | ICD-10-CM | POA: Diagnosis not present

## 2018-11-02 DIAGNOSIS — R9431 Abnormal electrocardiogram [ECG] [EKG]: Secondary | ICD-10-CM | POA: Diagnosis not present

## 2018-11-13 ENCOUNTER — Telehealth: Payer: Self-pay | Admitting: Gastroenterology

## 2018-11-13 NOTE — Telephone Encounter (Signed)
Rec'd from [facility] forwarded 6 pages to [place]

## 2018-11-22 ENCOUNTER — Other Ambulatory Visit (INDEPENDENT_AMBULATORY_CARE_PROVIDER_SITE_OTHER): Payer: Medicare Other

## 2018-11-22 ENCOUNTER — Encounter: Payer: Self-pay | Admitting: Gastroenterology

## 2018-11-22 ENCOUNTER — Ambulatory Visit (INDEPENDENT_AMBULATORY_CARE_PROVIDER_SITE_OTHER): Payer: Medicare Other | Admitting: Gastroenterology

## 2018-11-22 VITALS — BP 134/86 | HR 97 | Ht 62.0 in | Wt 200.0 lb

## 2018-11-22 DIAGNOSIS — Z8719 Personal history of other diseases of the digestive system: Secondary | ICD-10-CM

## 2018-11-22 LAB — COMPREHENSIVE METABOLIC PANEL WITH GFR
ALT: 6 U/L (ref 0–35)
AST: 10 U/L (ref 0–37)
Albumin: 3.7 g/dL (ref 3.5–5.2)
Alkaline Phosphatase: 101 U/L (ref 39–117)
BUN: 6 mg/dL (ref 6–23)
CO2: 27 meq/L (ref 19–32)
Calcium: 9.3 mg/dL (ref 8.4–10.5)
Chloride: 105 meq/L (ref 96–112)
Creatinine, Ser: 0.9 mg/dL (ref 0.40–1.20)
GFR: 68.22 mL/min
Glucose, Bld: 107 mg/dL — ABNORMAL HIGH (ref 70–99)
Potassium: 3.8 meq/L (ref 3.5–5.1)
Sodium: 140 meq/L (ref 135–145)
Total Bilirubin: 0.2 mg/dL (ref 0.2–1.2)
Total Protein: 7.1 g/dL (ref 6.0–8.3)

## 2018-11-22 LAB — CBC WITH DIFFERENTIAL/PLATELET
Basophils Absolute: 0.1 10*3/uL (ref 0.0–0.1)
Basophils Relative: 0.8 % (ref 0.0–3.0)
Eosinophils Absolute: 0.4 10*3/uL (ref 0.0–0.7)
Eosinophils Relative: 3.3 % (ref 0.0–5.0)
HCT: 41.9 % (ref 36.0–46.0)
Hemoglobin: 13.4 g/dL (ref 12.0–15.0)
Lymphocytes Relative: 20.2 % (ref 12.0–46.0)
Lymphs Abs: 2.5 10*3/uL (ref 0.7–4.0)
MCHC: 32.1 g/dL (ref 30.0–36.0)
MCV: 83.6 fl (ref 78.0–100.0)
Monocytes Absolute: 0.7 10*3/uL (ref 0.1–1.0)
Monocytes Relative: 5.5 % (ref 3.0–12.0)
Neutro Abs: 8.8 10*3/uL — ABNORMAL HIGH (ref 1.4–7.7)
Neutrophils Relative %: 70.2 % (ref 43.0–77.0)
Platelets: 365 10*3/uL (ref 150.0–400.0)
RBC: 5 Mil/uL (ref 3.87–5.11)
RDW: 16.4 % — ABNORMAL HIGH (ref 11.5–15.5)
WBC: 12.6 10*3/uL — ABNORMAL HIGH (ref 4.0–10.5)

## 2018-11-22 LAB — LIPASE: Lipase: 4 U/L — ABNORMAL LOW (ref 11.0–59.0)

## 2018-11-22 LAB — MAGNESIUM: Magnesium: 2 mg/dL (ref 1.5–2.5)

## 2018-11-22 MED ORDER — ONDANSETRON 4 MG PO TBDP: 4.0000 mg | ORAL_TABLET | Freq: Three times a day (TID) | ORAL | 2 refills | Status: DC | PRN

## 2018-11-22 NOTE — Progress Notes (Signed)
Chief Complaint: FU  Referring Provider:  Dr Bea Graff      ASSESSMENT AND PLAN;   #1. H/O diverticilitis 10/2018 ([place]) and ileus 11/2018 ([place]) #2.  Epigastric pain (resolved). S/p EGD 05/2018-small hiatal hernia, mild gastritis, retained food. H/O cholecystectomy 1999 #3.  GERD with small HH. #4.  Fatty liver.  Plan: - Check CBC, CMP, Mg today.  Replace electrolytes relatively if needed. - Continie probiotics once a day for now. - Continue Protonix 40 mg p.o. once a day. - Continue Zofran ODT 4 mg every 8 hours as needed #30, 2 refills. - Stop smoking. - Please obtain previous records (CTs, discharge summaries) from [facility] and [facility]. - FU in 8 weeks. At FU, proceed with colonoscopy with a 2-day prep. - Minimize pain meds as already being done.    HPI:    Lacey Erickson is a 43 y.o. female  For follow-up visit Had a bout of acute diverticulitis requiring admission to [facility] December 2019 After discharge, was readmitted to [facility] with ileus.  She did have low potassium and low magnesium.  Managed conservatively by IV fluids, replacing electrolytes, minimizing pain medications and ambulating.  Did not get NG tube.  Do not have the above records.  I could not find anything in care everywhere.  Currently has been having 2 bowel movements softer every day.  Upper abdominal pain is completely resolved.  She does complain of mild abdominal distention occasionally.  Better with defecation.  She has reduced pain medications.  Has increased water intake.  Patient continues to smoke despite of medical advice  No melena or hematochezia  Had EGD 05/2018 which showed a small hiatal hernia, retained food in the stomach without any gastric outlet obstruction.  Negative small bowel biopsies for celiac in the past.  Past Medical History:  Diagnosis Date  . Anxiety and depression   . Bipolar disorder ([place])   . Chronic respiratory failure ([place])     . Elevated LFTs   . Fibromyalgia   . GERD (gastroesophageal reflux disease)   . Hepatic steatosis   . Hyperlipemia   .    Marland Kitchen RHA (rheumatoid arthritis) ([place])     Past Surgical History:  Procedure Laterality Date  . APPENDECTOMY    . BACK SURGERY  07/2018  . BREAST LUMPECTOMY Left    Benign  . CHOLECYSTECTOMY    . ESOPHAGOGASTRODUODENOSCOPY  02/25/2016   Small hiatal hernia. Retained food in the stomach- no evidence of gastric outlet obstruction.   Marland Kitchen TOTAL ABDOMINAL HYSTERECTOMY W/ BILATERAL SALPINGOOPHORECTOMY     Cervical Cancer    Family History  Problem Relation Age of Onset  . Colon cancer Neg Hx   . Stomach cancer Neg Hx   . Rectal cancer Neg Hx   . Esophageal cancer Neg Hx     Social History   Tobacco Use  . Smoking status: Current Every Day Smoker    Packs/day: 0.50    Years: 20.00    Pack years: 10.00  . Smokeless tobacco: Never Used  Substance Use Topics  . Alcohol use: Not Currently  . Drug use: Never    Current Outpatient Medications  Medication Sig Dispense Refill  . carisoprodol (SOMA) 350 MG tablet Take 350 mg by mouth 3 (three) times daily.    Eduard Roux 140 MG/ML SOAJ Inject 140 mg into the skin every 30 (thirty) days. Injection on 28th of each month    . gabapentin (NEURONTIN) 800 MG tablet  Take 800 mg by mouth 3 (three) times daily.    Marland Kitchen HYDROcodone-acetaminophen (NORCO/VICODIN) 5-325 MG tablet Take 1 tablet by mouth every 8 (eight) hours as needed for moderate pain.    . Lactobacillus Rhamnosus, GG, (CULTURELLE PO) Take 1 tablet by mouth daily. 10 billion    . meclizine (ANTIVERT) 25 MG tablet Take 12.5 mg by mouth every 8 (eight) hours as needed.    . montelukast (SINGULAIR) 10 MG tablet Take 10 mg by mouth at bedtime.    . ondansetron (ZOFRAN) 4 MG tablet Take 1 tablet (4 mg total) by mouth every 6 (six) hours as needed for nausea or vomiting. 30 tablet 2  . pantoprazole (PROTONIX) 40 MG tablet Take 1 tablet (40 mg total) by mouth daily.  30 tablet 6  . prazosin (MINIPRESS) 1 MG capsule Take 1 mg by mouth at bedtime.    . ranitidine (ZANTAC) 150 MG tablet Take 1 tablet (150 mg total) by mouth at bedtime as needed for heartburn (take one daily at bedtime.). 30 tablet 6  . rizatriptan (MAXALT) 10 MG tablet Take 10 mg by mouth as needed for migraine. May repeat in 2 hours if needed    . rosuvastatin (CRESTOR) 10 MG tablet Take 10 mg by mouth daily.    . sertraline (ZOLOFT) 50 MG tablet Take 50 mg by mouth at bedtime.    . Suvorexant 10 MG TABS Take 1 tablet by mouth daily.    . Tiotropium Bromide Monohydrate (SPIRIVA RESPIMAT) 2.5 MCG/ACT AERS Inhale 2 puffs into the lungs daily.    Marland Kitchen tiZANidine (ZANAFLEX) 4 MG tablet Take 4 mg by mouth every 8 (eight) hours.     No current facility-administered medications for this visit.     Allergies  Allergen Reactions  . Adalimumab Other (See Comments)    Cervical Cancer  . Baclofen Other (See Comments)  . Etanercept Other (See Comments)  . Plaquenil [Hydroxychloroquine]     alopecia  . Topiramate Other (See Comments)    Numbness and tingling Numbness and tingling Numbness and tingling   . Armodafinil Palpitations    Review of Systems:  Constitutional: Denies fever, chills, diaphoresis, appetite change, has fatigue.  HEENT: Denies photophobia, eye pain, redness, hearing loss, ear pain, congestion, sore throat, rhinorrhea, sneezing, mouth sores, neck pain, neck stiffness and tinnitus.   Respiratory: Has some SOB, no DOE, cough, chest tightness,  and wheezing.   Cardiovascular: Denies chest pain, palpitations and leg swelling.  Genitourinary: Denies dysuria, has occ urgency, frequency. No flank pain and difficulty urinating.  Musculoskeletal: has myalgias, back pain, joint swelling, arthralgias and gait problem.  Skin: No rash.  Neurological: Denies dizziness, seizures, syncope, weakness, light-headedness, numbness and headaches.  Hematological: Denies adenopathy. Easy bruising,  personal or family bleeding history  Psychiatric/Behavioral: Has anxiety or depression     Physical Exam:    BP 134/86   Pulse 97   Ht 5\' 2"  (1.575 m)   Wt 200 lb (90.7 kg)   BMI 36.58 kg/m  Filed Weights   11/22/18 1105  Weight: 200 lb (90.7 kg)   Constitutional:  Well-developed, in no acute distress. Psychiatric: Normal mood and affect. Behavior is normal. HEENT: Pupils normal.  Conjunctivae are normal. No scleral icterus. Neck supple.  Cardiovascular: Normal rate, regular rhythm. No edema Pulmonary/chest: Effort normal and breath sounds normal. No wheezing, rales or rhonchi. Abdominal: Soft, nondistended.  Mild epigastric tender. Bowel sounds active throughout. There are no masses palpable. No hepatomegaly. Rectal:  defered Neurological:  Alert and oriented to person place and time. Skin: Skin is warm and dry. No rashes noted. Seen in presence of Pt's husband. 25 minutes spent with the patient today. Greater than 50% was spent in counseling and coordination of care with the patient    Carmell Austria, MD 11/22/2018, 11:33 AM  Cc: Dr Bea Graff

## 2018-11-22 NOTE — Patient Instructions (Signed)
If you are age 44 or older, your body mass index should be between 23-30. Your Body mass index is 36.58 kg/m. If this is out of the aforementioned range listed, please consider follow up with your Primary Care Provider.  If you are age 14 or younger, your body mass index should be between 19-25. Your Body mass index is 36.58 kg/m. If this is out of the aformentioned range listed, please consider follow up with your Primary Care Provider.   We have sent the following medications to your pharmacy for you to pick up at your convenience: Zofran  Please go to the lab on the 2nd floor [address] before you leave the office today.   Thank you,  Dr. Jackquline Denmark

## 2018-11-26 ENCOUNTER — Other Ambulatory Visit: Payer: Self-pay

## 2018-11-26 DIAGNOSIS — E876 Hypokalemia: Secondary | ICD-10-CM

## 2018-11-26 MED ORDER — POTASSIUM CHLORIDE CRYS ER 20 MEQ PO TBCR: 20.0000 meq | EXTENDED_RELEASE_TABLET | Freq: Every day | ORAL | 0 refills | Status: DC

## 2018-12-04 ENCOUNTER — Telehealth: Payer: Self-pay | Admitting: Gastroenterology

## 2018-12-04 NOTE — Telephone Encounter (Signed)
Patient returned call to office-patient reports she has been having diarrhea (5-6 episodes per day)- her normal is 2 BM's per day; patient reports she is consuming (6) 20 oz bottles of water per day, no longer consuming sodas, no other changes in diet; patient reports the bowel movements are every time she goes to the bathroom to urinate and "the bowel movements I am having are burning my bottom and are very acidic"; patient reports she has increased her oral intake (water since last OV with Dr. Lyndel Safe) and has not been consuming greasy fried foods;  Patient requested to know if she can use any OTC anti-diarrheal medications-advised patient she can do OTC medications if she wishes, however, MD may have alternative recommendations for treating her symptoms;   Please advise

## 2018-12-04 NOTE — Telephone Encounter (Signed)
Having diarrhea Plan: - Check GI pathogens and WBCs - Please obtain previous records (CTs, discharge summaries) from [facility] and [facility]. -Can use imodium 1-2 po qd for now. Not more as she had ileus in past. -Normalize water intake. Her electolytes were normal. -Make sure she is not taking magnesium supplements. -Can stop probiotics now. -Let us know how she is in 2 to 3 weeks.

## 2018-12-04 NOTE — Telephone Encounter (Signed)
Attempted to reach patient to obtain additional information concerning symptoms-no answer- Left message for patient to call back to office;  Please advise next step in plan of care for this patient-

## 2018-12-05 NOTE — Telephone Encounter (Signed)
Left message for patient to call back  

## 2018-12-05 NOTE — Telephone Encounter (Signed)
Pt returned your call.  

## 2018-12-06 ENCOUNTER — Other Ambulatory Visit: Payer: Self-pay

## 2018-12-06 DIAGNOSIS — R197 Diarrhea, unspecified: Secondary | ICD-10-CM

## 2018-12-06 NOTE — Telephone Encounter (Signed)
Left message for patient to call back  

## 2018-12-06 NOTE — Telephone Encounter (Signed)
Patient returned call to office- patient informed of MD recommendations and patient is agreeable to plan of care; patient aware a stool sample is being requested and reports she "will try to get that done at the first of next week"; patient advised of medication changes;  Patient verbalized understanding to call back to the office to provide an update on symptoms or lack there of; Patient verbalized understanding of information/instructions; Patient was advised to call back if questions/concerns arise;  orders placed in Epic for stool samples;

## 2018-12-11 ENCOUNTER — Other Ambulatory Visit: Payer: Medicare Other

## 2018-12-11 DIAGNOSIS — E876 Hypokalemia: Secondary | ICD-10-CM

## 2018-12-11 LAB — BASIC METABOLIC PANEL
BUN: 16 mg/dL (ref 6–23)
CO2: 21 meq/L (ref 19–32)
Calcium: 9.7 mg/dL (ref 8.4–10.5)
Chloride: 108 mEq/L (ref 96–112)
Creatinine, Ser: 1.19 mg/dL (ref 0.40–1.20)
GFR: 49.41 mL/min — ABNORMAL LOW (ref >60.00)
GLUCOSE: 97 mg/dL (ref 70–99)
Potassium: 3.9 mEq/L (ref 3.5–5.1)
Sodium: 141 mEq/L (ref 135–145)

## 2018-12-11 LAB — CBC WITH DIFFERENTIAL/PLATELET
Basophils Absolute: 0.1 10*3/uL (ref 0.0–0.1)
Basophils Relative: 1 % (ref 0.0–3.0)
Eosinophils Absolute: 0.4 10*3/uL (ref 0.0–0.7)
Eosinophils Relative: 3.8 % (ref 0.0–5.0)
HCT: 41.6 % (ref 36.0–46.0)
Hemoglobin: 13.6 g/dL (ref 12.0–15.0)
Lymphocytes Relative: 24.5 % (ref 12.0–46.0)
Lymphs Abs: 2.6 10*3/uL (ref 0.7–4.0)
MCHC: 32.7 g/dL (ref 30.0–36.0)
MCV: 83.5 fl (ref 78.0–100.0)
Monocytes Absolute: 0.6 10*3/uL (ref 0.1–1.0)
Monocytes Relative: 5.3 % (ref 3.0–12.0)
Neutro Abs: 7 10*3/uL (ref 1.4–7.7)
Neutrophils Relative %: 65.4 % (ref 43.0–77.0)
Platelets: 305 10*3/uL (ref 150.0–400.0)
RBC: 4.98 Mil/uL (ref 3.87–5.11)
RDW: 16.4 % — ABNORMAL HIGH (ref 11.5–15.5)
WBC: 10.7 10*3/uL — ABNORMAL HIGH (ref 4.0–10.5)

## 2018-12-11 LAB — MAGNESIUM: Magnesium: 2 mg/dL (ref 1.5–2.5)

## 2018-12-26 ENCOUNTER — Telehealth: Payer: Self-pay | Admitting: Gastroenterology

## 2018-12-26 NOTE — Progress Notes (Signed)
Left message with spouse for patient to call back to the office

## 2018-12-26 NOTE — Telephone Encounter (Signed)
Pt called stating she was returning your call back

## 2018-12-26 NOTE — Telephone Encounter (Signed)
Please see additional documentation concerning this patient 

## 2019-02-15 ENCOUNTER — Other Ambulatory Visit: Payer: Self-pay | Admitting: Gastroenterology

## 2019-02-18 NOTE — Telephone Encounter (Signed)
Pt is requesting refill of Ranitidine. She is also taking Protonix 40mg  qd. Are you ok to refill Ranitidine?

## 2019-02-19 NOTE — Telephone Encounter (Signed)
May have to change to pepcid 40mg  po qhs (30, 6 refills)

## 2019-02-20 MED ORDER — FAMOTIDINE 40 MG PO TABS: 40.0000 mg | ORAL_TABLET | Freq: Every day | ORAL | 6 refills | Status: DC

## 2019-02-20 NOTE — Telephone Encounter (Signed)
Left message for pt to return call. Sent in new prescription for Pepcid per Dr. Steve Rattler orders.

## 2019-06-13 ENCOUNTER — Telehealth: Payer: Self-pay | Admitting: Gastroenterology

## 2019-06-13 MED ORDER — FAMOTIDINE 40 MG PO TABS: 40.0000 mg | ORAL_TABLET | Freq: Every day | ORAL | 11 refills | Status: DC

## 2019-06-13 NOTE — Telephone Encounter (Signed)
pepcid 40mg  po qd, 30, 11 refills  RG

## 2019-06-13 NOTE — Telephone Encounter (Signed)
Sent prescription to patients pharmacy, patient notified.

## 2019-06-13 NOTE — Telephone Encounter (Signed)
Please advise 

## 2019-11-08 HISTORY — PX: NASAL SEPTUM SURGERY: SHX37

## 2020-02-18 ENCOUNTER — Other Ambulatory Visit: Payer: Self-pay | Admitting: Gastroenterology

## 2020-03-10 ENCOUNTER — Ambulatory Visit: Payer: Medicare Other | Admitting: Gastroenterology

## 2020-03-20 ENCOUNTER — Ambulatory Visit: Payer: Medicaid Other | Admitting: Nurse Practitioner

## 2020-03-20 NOTE — Progress Notes (Deleted)
     03/20/2020 Lacey Erickson [id number] 04/13/1976   Chief Complaint:  History of Present Illness: Lacey Erickson is a 44 year old female with a past medical history of anxiety, depression, bipolar, fibromyalgia, rheumatoid arthritis on Erenumab, diverticulitis 10/2018, ileus 11/2018, GERD, small hiatal hernia and fatty liver. Cervical cancer s/p total abdominal hysterectomy. S/P cholecystectomy 1999. She was last seen in our office by Dr. Lyndel Safe 11/2019. A colonoscopy with a 2 day prep in 8 weeks was recommended but was not done.   Had a bout of acute diverticulitis requiring admission to [facility] December 2019 After discharge, was readmitted to [place] with ileus.  She did have low potassium and low magnesium.  Managed conservatively by IV fluids, replacing electrolytes, minimizing pain medications and ambulating.  Did not get NG tube  EGD 05/2018 which showed a small hiatal hernia, retained food in the stomach without any gastric outlet obstruction.  Negative small bowel biopsies for celiac in the past.    Current Medications, Allergies, Past Medical History, Past Surgical History, Family History and Social History were reviewed in [employer] record.   Physical Exam: There were no vitals taken for this visit. General: Well developed, w   ***female in no acute distress. Head: Normocephalic and atraumatic. Eyes: No scleral icterus. Conjunctiva pink . Ears: Normal auditory acuity. Mouth: Dentition intact. No ulcers or lesions.  Lungs: Clear throughout to auscultation. Heart: Regular rate and rhythm, no murmur. Abdomen: Soft, nontender and nondistended. No masses or hepatomegaly. Normal bowel sounds x 4 quadrants.  Rectal: *** Musculoskeletal: Symmetrical with no gross deformities. Extremities: No edema. Neurological: Alert oriented x 4. No focal deficits.  Psychological: Alert and cooperative. Normal mood and affect  Assessment and  Recommendations:  31. 44 year old female with a history of acute diverticulitis requiring admission to [facility] December 2019 after discharge, was readmitted to [facility] with ileus. -Colonoscopy benefits and risks discussed including risk with sedation, risk of bleeding, perforation and infection

## 2020-04-30 ENCOUNTER — Ambulatory Visit: Payer: Medicaid Other | Admitting: Gastroenterology

## 2020-07-11 ENCOUNTER — Other Ambulatory Visit: Payer: Self-pay | Admitting: Gastroenterology

## 2020-08-13 ENCOUNTER — Other Ambulatory Visit: Payer: Self-pay

## 2020-08-13 ENCOUNTER — Ambulatory Visit (INDEPENDENT_AMBULATORY_CARE_PROVIDER_SITE_OTHER): Payer: Medicare Other | Admitting: Internal Medicine

## 2020-08-13 ENCOUNTER — Encounter: Payer: Self-pay | Admitting: Internal Medicine

## 2020-08-13 VITALS — BP 118/64 | HR 86 | Temp 98.4°F | Ht 62.0 in | Wt 252.8 lb

## 2020-08-13 DIAGNOSIS — R0609 Other forms of dyspnea: Secondary | ICD-10-CM

## 2020-08-13 DIAGNOSIS — R0789 Other chest pain: Secondary | ICD-10-CM

## 2020-08-13 DIAGNOSIS — Z87891 Personal history of nicotine dependence: Secondary | ICD-10-CM

## 2020-08-13 DIAGNOSIS — R0600 Dyspnea, unspecified: Secondary | ICD-10-CM | POA: Diagnosis not present

## 2020-08-13 DIAGNOSIS — R059 Cough, unspecified: Secondary | ICD-10-CM

## 2020-08-13 DIAGNOSIS — R062 Wheezing: Secondary | ICD-10-CM | POA: Diagnosis not present

## 2020-08-13 LAB — CBC WITH DIFFERENTIAL/PLATELET
Basophils Absolute: 0.1 10*3/uL (ref 0.0–0.1)
Basophils Relative: 0.5 % (ref 0.0–3.0)
Eosinophils Absolute: 0.3 10*3/uL (ref 0.0–0.7)
Eosinophils Relative: 2.5 % (ref 0.0–5.0)
HCT: 40.9 % (ref 36.0–46.0)
Hemoglobin: 13 g/dL (ref 12.0–15.0)
Lymphocytes Relative: 23.3 % (ref 12.0–46.0)
Lymphs Abs: 3.2 10*3/uL (ref 0.7–4.0)
MCHC: 31.8 g/dL (ref 30.0–36.0)
MCV: 82.6 fl (ref 78.0–100.0)
Monocytes Absolute: 0.7 10*3/uL (ref 0.1–1.0)
Monocytes Relative: 5.2 % (ref 3.0–12.0)
Neutro Abs: 9.4 10*3/uL — ABNORMAL HIGH (ref 1.4–7.7)
Neutrophils Relative %: 68.5 % (ref 43.0–77.0)
Platelets: 288 10*3/uL (ref 150.0–400.0)
RBC: 4.95 Mil/uL (ref 3.87–5.11)
RDW: 17.6 % — ABNORMAL HIGH (ref 11.5–15.5)
WBC: 13.7 10*3/uL — ABNORMAL HIGH (ref 4.0–10.5)

## 2020-08-13 LAB — NITRIC OXIDE: Nitric Oxide: 5

## 2020-08-13 MED ORDER — PREDNISONE 10 MG PO TABS: ORAL_TABLET | ORAL | 0 refills | Status: DC

## 2020-08-13 MED ORDER — BUDESONIDE-FORMOTEROL FUMARATE 160-4.5 MCG/ACT IN AERO: 2.0000 | INHALATION_SPRAY | Freq: Two times a day (BID) | RESPIRATORY_TRACT | 6 refills | Status: DC

## 2020-08-13 NOTE — Patient Instructions (Addendum)
ICD-10-CM   1. Dyspnea on exertion  R06.00   2. Cough  R05.9 Nitric oxide  3. Wheezing  R06.2 Nitric oxide  4. Chest pressure  R07.89   5. History of smoking greater than 50 pack years  Z87.891     Symptoms are likely due to asthma but feno tests aruges against it Symptoms can be due to COPD which is like asthma but associatd with smoking Chest pressure can be from above -> but could also be due to potential heart disease   Pla - Do Alpha 1 AT phenoptye, blood IgE, RAST Allergy panel and cbc with diff  08/13/2020 before starting steroids - do Please take prednisone 40 mg x1 day, then 30 mg x1 day, then 20 mg x1 day, then 10 mg x1 day, and then 5 mg x1 day and stop - start symbicort 160/4.5 , 2 puff twice daily  - do HRCT supine and prone  - continue albuterol as needed -Please discusse with the primary care physician about potential cardiology referral for chest pressure  Followup  - 3-5 weeks with APP to report progress and review results   - pls note we do not call back with normal results

## 2020-08-13 NOTE — Addendum Note (Signed)
Addended by: Lorretta Harp on: 08/13/2020 02:39 PM   Modules accepted: Orders

## 2020-08-13 NOTE — Progress Notes (Signed)
OV 08/13/2020  Subjective:  Patient ID: Lacey Erickson, female , DOB: 1976-11-04 , age 44 y.o. , MRN: [id number] , ADDRESS: [address] [place] [address] PCP Raina Mina., MD Relevant Other Providers: - x This Provider for this visit: Treatment Team:  Attending Provider: Brand Males, MD    08/13/2020 -   Chief Complaint  Patient presents with  . Consult    Pt being referred due to having fluid on lungs via cxr. Pt has complaints of SOB, cough, wheezing, and chest tightness.     HPI Lacey Erickson 44 y.o. -presents with her husband.  Referred by primary care specialist.  She lives near [place], [place].  She tells me that at baseline she is disabled because of bipolar and fibromyalgia and osteoarthritis and prediabetes.  She also carries a diagnosis of rheumatoid arthritis not otherwise specified made many years ago.  But she is not on any immunomodulators.  She also suffers from obesity.  She is a chronic smoker.  Then approximately 6 months ago developed insidious onset of shortness of breath, cough and wheezing.  All the symptoms of present and made worse by exertion and relieved by rest.  The wheezing and cough also wake her up in the middle of the night.  She can just walk across to the bathroom and get worse with all the symptoms coming on together.  In addition she is also reporting a chest pain that feels like a "elephant in the chest" but she denies any previous heart attack.  She says approximately 3 to 4 months ago she was admitted to [facility] because chest x-ray showed fluid in her chest and lungs not otherwise specified.  She says " nothing much" was done at the hospital and then she was discharged.  However symptoms have persisted and is getting worse.  Symptoms are rated as moderate to severe overall.  She has tried Spiriva for her symptoms it does not help.  She has been on multiple prednisone courses and this is helped.  Not been on any inhaled  steroids.  She denies any mold or mildew exposure.  She denies any vaping or electronic cigarette use or cocaine use.  Only substance of abuse is tobacco.  She denies any occupational exposure to mold.  FENO is 5 and not c/w eosinophilic asthma  Heavy smoking 2.5 pack x 33 years  Coronary artery disease risk factors include smoking, obesity, prediabetes and hyperlipidemia  Results for Lacey Erickson, Lacey Erickson (MRN [id number]) as of 08/13/2020 13:57  Ref. Range 05/22/2018 14:45 11/22/2018 12:14 12/11/2018 09:06  Creatinine Latest Ref Range: 0.40 - 1.20 mg/dL 1.12 0.90 1.19  Results for Lacey Erickson, Lacey Erickson (MRN [id number]) as of 08/13/2020 13:57  Ref. Range 12/11/2018 09:06  Hemoglobin Latest Ref Range: 12.0 - 15.0 g/dL 13.6   Results for Lacey Erickson, Lacey Erickson (MRN [id number]) as of 08/13/2020 13:57  Ref. Range 12/11/2018 09:06  Eosinophils Absolute Latest Ref Range: 0 - 0 K/uL 0.4   ROS - per HPI     has a past medical history of Anxiety and depression, Bipolar disorder ([place]), Cervical cancer ([place]), Chronic respiratory failure ([place]), Diverticulitis, Elevated LFTs, Fibromyalgia, GERD (gastroesophageal reflux disease), Hepatic steatosis, Hyperlipemia, Ileus ([place]), Osteopenia, Oxygen dependent, and RHA (rheumatoid arthritis) ([place]).   reports that she has been smoking cigarettes. She has a 82.50 pack-year smoking history. She has never used smokeless tobacco.  Past Surgical History:  Procedure Laterality Date  . APPENDECTOMY    . BACK SURGERY  07/2018  . BREAST LUMPECTOMY Left    Benign  . CHOLECYSTECTOMY    . ESOPHAGOGASTRODUODENOSCOPY  02/25/2016   Small hiatal hernia. Retained food in the stomach- no evidence of gastric outlet obstruction.   [facility] TOTAL ABDOMINAL HYSTERECTOMY W/ BILATERAL SALPINGOOPHORECTOMY     Cervical Cancer    Allergies  Allergen Reactions  . Adalimumab Other (See Comments)    Cervical Cancer  . Baclofen Other (See Comments)  . Etanercept Other (See Comments)  . Plaquenil  [Hydroxychloroquine]     alopecia  . Topiramate Other (See Comments)    Numbness and tingling Numbness and tingling Numbness and tingling   . Armodafinil Palpitations    Immunization History  Administered Date(s) Administered  . PFIZER SARS-COV-2 Vaccination 02/13/2020, 03/09/2020    Family History  Problem Relation Age of Onset  . Colon cancer Neg Hx   . Stomach cancer Neg Hx   . Rectal cancer Neg Hx   . Esophageal cancer Neg Hx      Current Outpatient Medications:  .  fluvoxaMINE (LUVOX) 100 MG tablet, Take 100 mg by mouth 2 (two) times daily., Disp: , Rfl:  .  furosemide (LASIX) 20 MG tablet, Take 20 mg by mouth daily., Disp: , Rfl:  .  gabapentin (NEURONTIN) 800 MG tablet, Take 800 mg by mouth 3 (three) times daily., Disp: , Rfl:  .  methocarbamol (ROBAXIN) 750 MG tablet, Take 750 mg by mouth 3 (three) times daily., Disp: , Rfl:  .  mirtazapine (REMERON) 15 MG tablet, Take 15 mg by mouth at bedtime., Disp: , Rfl:  .  omeprazole (PRILOSEC) 20 MG capsule, Take by mouth., Disp: , Rfl:  .  ondansetron (ZOFRAN ODT) 4 MG disintegrating tablet, Take 1 tablet (4 mg total) by mouth every 8 (eight) hours as needed for nausea or vomiting., Disp: 30 tablet, Rfl: 2 .  ondansetron (ZOFRAN) 4 MG tablet, Take 1 tablet (4 mg total) by mouth every 6 (six) hours as needed for nausea or vomiting., Disp: 30 tablet, Rfl: 2 .  Oxcarbazepine (TRILEPTAL) 300 MG tablet, Take 300 mg by mouth at bedtime., Disp: , Rfl:  .  prazosin (MINIPRESS) 1 MG capsule, Take 1 mg by mouth at bedtime., Disp: , Rfl:  .  rizatriptan (MAXALT) 10 MG tablet, Take 10 mg by mouth as needed for migraine. May repeat in 2 hours if needed, Disp: , Rfl:  .  rosuvastatin (CRESTOR) 10 MG tablet, Take 10 mg by mouth daily., Disp: , Rfl:  .  Tiotropium Bromide Monohydrate (SPIRIVA RESPIMAT) 2.5 MCG/ACT AERS, Inhale 2 puffs into the lungs daily., Disp: , Rfl:  .  Vitamin D, Ergocalciferol, (DRISDOL) 1.25 MG (50000 UNIT) CAPS capsule,  Take 50,000 Units by mouth every 7 (seven) days., Disp: , Rfl:       Objective:   Vitals:   08/13/20 1402  BP: 118/64  Pulse: 86  Temp: 98.4 F (36.9 C)  TempSrc: Other (Comment)  SpO2: 95%  Weight: 252 lb 12.8 oz (114.7 kg)  Height: 5\' 2"  (1.575 m)    Estimated body mass index is 46.24 kg/m as calculated from the following:   Height as of this encounter: 5\' 2"  (1.575 m).   Weight as of this encounter: 252 lb 12.8 oz (114.7 kg).  @WEIGHTCHANGE @  [employer]   08/13/20 1402  Weight: 252 lb 12.8 oz (114.7 kg)     Physical Exam  General Appearance:    Alert, cooperative, no distress, appears stated age - yes , Deconditioned  looking - no , OBESE  - yes, Sitting on Wheelchair -  no  Head:    Normocephalic, without obvious abnormality, atraumatic  Eyes:    PERRL, conjunctiva/corneas clear,  Ears:    Normal TM's and external ear canals, both ears  Nose:   Nares normal, septum midline, mucosa normal, no drainage    or sinus tenderness. OXYGEN ON  - no . Patient is @ ra   Throat:   Lips, mucosa, and tongue normal; teeth and gums normal. Cyanosis on lips - no  Neck:   Supple, symmetrical, trachea midline, no adenopathy;    thyroid:  no enlargement/tenderness/nodules; no carotid   bruit or JVD  Back:     Symmetric, no curvature, ROM normal, no CVA tenderness  Lungs:     Distress - no , Wheeze YES, Barrell Chest - no, Purse lip breathing - no, Crackles - no   Chest Wall:    No tenderness or deformity.    Heart:    Regular rate and rhythm, S1 and S2 normal, no rub   or gallop, Murmur - n  Breast Exam:    NOT DONE  Abdomen:     Soft, non-tender, bowel sounds active all four quadrants,    no masses, no organomegaly. Visceral obesity - YES  Genitalia:   NOT DONE  Rectal:   NOT DONE  Extremities:   Extremities - normal, Has Cane - no, Clubbing - no, Edema - no  Pulses:   2+ and symmetric all extremities  Skin:   Stigmata of Connective Tissue Disease - no  Lymph nodes:    Cervical, supraclavicular, and axillary nodes normal  Psychiatric:  Neurologic:   Pleasant - yes, Anxious - no, Flat affect - no  CAm-ICU - neg, Alert and Oriented x 3 - yes, Moves all 4s - yes, Speech - normal, Cognition - intact         Assessment:       ICD-10-CM   1. Dyspnea on exertion  R06.00   2. Cough  R05.9 Nitric oxide  3. Wheezing  R06.2 Nitric oxide  4. Chest pressure  R07.89   5. History of smoking greater than 50 pack years  Z87.891        Plan:     Patient Instructions     ICD-10-CM   1. Dyspnea on exertion  R06.00   2. Cough  R05.9 Nitric oxide  3. Wheezing  R06.2 Nitric oxide  4. Chest pressure  R07.89   5. History of smoking greater than 50 pack years  Z87.891     Symptoms are likely due to asthma but feno tests aruges against it Symptoms can be due to COPD which is like asthma but associatd with smoking Chest pressure can be from above -> but could also be due to potential heart disease   Pla - Do Alpha 1 AT phenoptye, blood IgE, RAST Allergy panel and cbc with diff  08/13/2020 before starting steroids - do Please take prednisone 40 mg x1 day, then 30 mg x1 day, then 20 mg x1 day, then 10 mg x1 day, and then 5 mg x1 day and stop - start symbicort 160/4.5 , 2 puff twice daily  - do HRCT supine and prone  - continue albuterol as needed -Please discusse with the primary care physician about potential cardiology referral for chest pressure  Followup  - 3-5 weeks with APP to report progress and review results   - pls note we do not call  back with normal results      SIGNATURE    Dr. Brand Males, M.D., F.C.C.P,  Pulmonary and Critical Care Medicine Staff Physician, [place] Director - Interstitial Lung Disease  Program  Pulmonary [facility] at [place], [place], [address]  Pager: [phone], If no answer or between  15:00h - 7:00h: call [phone] Telephone: [phone]  2:35 PM 08/13/2020

## 2020-08-13 NOTE — Addendum Note (Signed)
Addended by: Suzzanne Cloud E on: 08/13/2020 02:48 PM   Modules accepted: Orders

## 2020-08-14 NOTE — Progress Notes (Signed)
Blood allergy panel negative except ragweed mildly positive. Blood eos 300cell/cu mm. High normal. Will not call with results. Results will be discussed at followup

## 2020-08-24 LAB — RESPIRATORY ALLERGY PROFILE REGION II ~~LOC~~
Allergen, A. alternata, m6: <0.1 kU/L
Allergen, Cedar tree, t12: <0.1 kU/L
Allergen, Comm Silver Birch, t9: <0.1 kU/L
Allergen, Cottonwood, t14: <0.1 kU/L
Allergen, D pternoyssinus,d7: <0.1 kU/L
Allergen, Mouse Urine Protein, e78: <0.1 kU/L
Allergen, Mulberry, t76: <0.1 kU/L
Allergen, Oak,t7: <0.1 kU/L
Allergen, P. notatum, m1: <0.1 kU/L
Aspergillus fumigatus, m3: <0.1 kU/L
Bermuda Grass: <0.1 kU/L
Box Elder IgE: <0.1 kU/L
CLADOSPORIUM HERBARUM (M2) IGE: <0.1 kU/L
COMMON RAGWEED (SHORT) (W1) IGE: 0.34 kU/L — ABNORMAL HIGH
Cat Dander: <0.1 kU/L
Class: 0
Class: 0
Class: 0
Class: 0
Class: 0
Class: 0
Class: 0
Class: 0
Class: 0
Class: 0
Class: 0
Class: 0
Class: 0
Class: 0
Class: 0
Class: 0
Class: 0
Class: 0
Class: 0
Class: 0
Class: 0
Class: 0
Class: 0
Cockroach: <0.1 kU/L
D. farinae: <0.1 kU/L
Dog Dander: <0.1 kU/L
Elm IgE: <0.1 kU/L
IgE (Immunoglobulin E), Serum: 110 kU/L (ref <=114)
Johnson Grass: <0.1 kU/L
Pecan/Hickory Tree IgE: <0.1 kU/L
Rough Pigweed  IgE: <0.1 kU/L
Sheep Sorrel IgE: <0.1 kU/L
Timothy Grass: <0.1 kU/L

## 2020-08-24 LAB — ALPHA-1 ANTITRYPSIN PHENOTYPE: A-1 Antitrypsin, Ser: 217 mg/dL — ABNORMAL HIGH (ref 83–199)

## 2020-08-24 LAB — INTERPRETATION:

## 2020-09-03 ENCOUNTER — Other Ambulatory Visit: Payer: Self-pay

## 2020-09-03 ENCOUNTER — Encounter: Payer: Self-pay | Admitting: Pulmonary Disease

## 2020-09-03 ENCOUNTER — Ambulatory Visit (INDEPENDENT_AMBULATORY_CARE_PROVIDER_SITE_OTHER): Payer: Medicare Other | Admitting: Pulmonary Disease

## 2020-09-03 VITALS — BP 120/70 | HR 84 | Temp 98.5°F | Ht 62.0 in | Wt 254.7 lb

## 2020-09-03 DIAGNOSIS — Z8709 Personal history of other diseases of the respiratory system: Secondary | ICD-10-CM | POA: Insufficient documentation

## 2020-09-03 DIAGNOSIS — M069 Rheumatoid arthritis, unspecified: Secondary | ICD-10-CM | POA: Diagnosis not present

## 2020-09-03 DIAGNOSIS — Z23 Encounter for immunization: Secondary | ICD-10-CM | POA: Diagnosis not present

## 2020-09-03 DIAGNOSIS — K219 Gastro-esophageal reflux disease without esophagitis: Secondary | ICD-10-CM | POA: Diagnosis not present

## 2020-09-03 DIAGNOSIS — J849 Interstitial pulmonary disease, unspecified: Secondary | ICD-10-CM | POA: Diagnosis not present

## 2020-09-03 DIAGNOSIS — J309 Allergic rhinitis, unspecified: Secondary | ICD-10-CM

## 2020-09-03 DIAGNOSIS — Z9889 Other specified postprocedural states: Secondary | ICD-10-CM

## 2020-09-03 DIAGNOSIS — F17200 Nicotine dependence, unspecified, uncomplicated: Secondary | ICD-10-CM | POA: Insufficient documentation

## 2020-09-03 DIAGNOSIS — Z9189 Other specified personal risk factors, not elsewhere classified: Secondary | ICD-10-CM | POA: Insufficient documentation

## 2020-09-03 DIAGNOSIS — R0609 Other forms of dyspnea: Secondary | ICD-10-CM

## 2020-09-03 DIAGNOSIS — R0600 Dyspnea, unspecified: Secondary | ICD-10-CM

## 2020-09-03 DIAGNOSIS — Z0000 Encounter for general adult medical examination without abnormal findings: Secondary | ICD-10-CM

## 2020-09-03 DIAGNOSIS — R059 Cough, unspecified: Secondary | ICD-10-CM

## 2020-09-03 MED ORDER — NICOTINE 21 MG/24HR TD PT24: 21.0000 mg | MEDICATED_PATCH | Freq: Every day | TRANSDERMAL | 3 refills | Status: DC

## 2020-09-03 MED ORDER — NICOTINE POLACRILEX 4 MG MT LOZG: 4.0000 mg | LOZENGE | OROMUCOSAL | 3 refills | Status: DC | PRN

## 2020-09-03 NOTE — Assessment & Plan Note (Signed)
Plan: °Flu vaccine today °

## 2020-09-03 NOTE — Assessment & Plan Note (Signed)
Cough is likely multifactorial given known ILD, persistent uncontrolled acid reflux and allergic rhinitis symptoms  Plan: Pulmonary function testing ordered today Follow GERD lifestyle diet and follow GERD medications recommended by gastroenterology Schedule follow-up with gastroenterology Start daily antihistamine Start Flonase Can use chlor tabs or Benadryl at night to help with postnasal drip Emphasized need for the patient stop smoking

## 2020-09-03 NOTE — Assessment & Plan Note (Signed)
Plan: Continue omeprazole 20 mg twice daily May need to consider increasing omeprazole dosing given patient's persistent reflux symptoms Patient needs to stop smoking Emphasized importance of following GERD lifestyle diet Encourage patient to schedule follow-up with current gastroenterologist Dr. Lyndel Safe

## 2020-09-03 NOTE — Patient Instructions (Addendum)
You were seen today by Lauraine Rinne, NP  for:   1. ILD (interstitial lung disease) ([place])  - CT Chest High Resolution; Future - Pulmonary function test; Future - ANA,IFA RA Diag Pnl w/rflx Tit/Patn; Future - MyoMarker 3 Plus Profile (RDL); Future - Sjogren's syndrome antibods(ssa + ssb); Future - ANCA Screen Reflex Titer; Future - Hypersensitivity Pneumonitis; Future - Anti-Scleroderma Antibody; Future - Anti-Scleroderma Antibody - Hypersensitivity Pneumonitis - ANCA Screen Reflex Titer - Sjogren's syndrome antibods(ssa + ssb) - MyoMarker 3 Plus Profile (RDL) - ANA,IFA RA Diag Pnl w/rflx Tit/Patn  We will refer you back to rheumatology  We will repeat a high-resolution CT of your chest in 1 year  We ordered a pulmonary function test to assess your breathing  2. Dyspnea on exertion  - CT Chest High Resolution; Future - Pulmonary function test; Future - ANA,IFA RA Diag Pnl w/rflx Tit/Patn; Future - MyoMarker 3 Plus Profile (RDL); Future - Sjogren's syndrome antibods(ssa + ssb); Future - ANCA Screen Reflex Titer; Future - Hypersensitivity Pneumonitis; Future - Anti-Scleroderma Antibody; Future - Anti-Scleroderma Antibody - Hypersensitivity Pneumonitis - ANCA Screen Reflex Titer - Sjogren's syndrome antibods(ssa + ssb) - MyoMarker 3 Plus Profile (RDL) - ANA,IFA RA Diag Pnl w/rflx Tit/Patn  3. Rheumatoid arthritis, involving unspecified site, unspecified whether rheumatoid factor present [facility])  - Ambulatory referral to Rheumatology  Referral to rheumatology today  4. Gastroesophageal reflux disease, unspecified whether esophagitis present  Omeprazole 20 mg tablet  >>>Please take 1 tablet daily 15 minutes to 30 minutes before your first meal of the day as well as before your other medications, take again in 12 hours  >>>Try to take at the same time each day >>>take this medication daily  GERD management: >>>Avoid laying flat until 2 hours after meals >>>Elevate head  of the bed including entire chest >>>Reduce size of meals and amount of fat, acid, spices, caffeine and sweets >>>If you are smoking, Please stop! >>>Decrease alcohol consumption >>>Work on maintaining a healthy weight with normal BMI   Schedule follow-up with Dr. Lyndel Safe given your persistent acid reflux symptoms  5. Cough  I believe cough is likely driven from your known allergic rhinitis, known uncontrolled acid reflux and interstitial lung disease  Can use Delsym over-the-counter cough medicine  Obtain better control of your acid reflux  Follow the instructions listed below for allergic rhinitis  6. Allergic rhinitis, unspecified seasonality, unspecified trigger  Please start taking a daily antihistamine:  >>>choose one of: zyrtec, claritin, allegra, or xyzal  >>>these are over the counter medications  >>>can choose generic option  >>>take daily  >>>this medication helps with allergies, post nasal drip, and cough   Please start taking chlorpheniramine (aka Chlor tabs) 4 mg tablet (1 to 2 tablets at night) for management of allergies and postnasal drip at night >>> This is an over-the-counter medication >>> This medication is sedating  Can start taking fluticasone/Flonase 1 spray each nostril daily as needed for nasal congestion or allergic rhinitis symptoms  7. Smoker  - nicotine (NICODERM CQ) 21 mg/24hr patch; Place 1 patch (21 mg total) onto the skin daily.  Dispense: 28 patch; Refill: 3 - nicotine polacrilex (COMMIT) 4 MG lozenge; Take 1 lozenge (4 mg total) by mouth as needed for smoking cessation.  Dispense: 100 tablet; Refill: 3  We recommend that you stop smoking.  >>>You need to set a quit date >>>If you have friends or family who smoke, let them know you are trying to quit and not to smoke  around you or in your living environment  Smoking Cessation Resources:  1 800 QUIT NOW  >>> Patient to call this resource and utilize it to help support her quit smoking >>>  Keep up your hard work with stopping smoking  You can also contact the [facility] >>>For smoking cessation classes call [phone]  We do not recommend using e-cigarettes as a form of stopping smoking  You can sign up for smoking cessation support texts and information:  >>>https://smokefree.gov/smokefreetxt   Nicotine patches: >>>Make sure you rotate sites that you do not get skin irritation, Apply 1 patch each morning to a non-hairy skin site  If you are smoking greater than 10 cigarettes/day and weigh over 45 kg start with the nicotine patch of 21 mg a day for 6 weeks, then 14 mg a day for 2 weeks, then finished with 7 mg a day for 2 weeks, then stop  If you are smoking less than 10 cigarettes a day or weight less than 45 kg start with medium dose pack of 14 mg a day for 6 weeks, followed by 7 mg a day for 2 weeks   >>>If insomnia occurs you are having trouble sleeping you can take the patch off at night, and place a new one on in the morning >>>If the patch is removed at night and you have morning cravings start short acting nicotine replacement therapy such as gum or lozenges   Nicotine lozenge: Lozenges are commonly uses short acting NRT product  >>>Smokers who smoke within 30 minutes of awakening should use 4 mg dose >>>Smokers who wait more than 30 minutes after awakening to smoke should use 2 mg dose  Can use up to 1 lozenge every 1-2 hours for 6 weeks >>>Total amount of lozenges that can be used per day as 20 >>>Gradually reduce number of lozenges used per day after 2 weeks of use  Place lozenge in mouth and allowed to dissolve for 30 minutes loss and does not need to be chewed  Lozenges have advantages to be able to be used in people with TMG, poor dentition, dentures   8. At risk for obstructive sleep apnea  We will request records from your last sleep study from Dr. Lyndel Safe  May need to consider home sleep study in the future  9. History of  nasal polypectomy  We will request records from your ENT/surgeon in [place]  10. Healthcare maintenance  - Flu Vaccine QUAD 36+ mos IM (Fluarix, Quad PF)   We recommend today:  Orders Placed This Encounter  Procedures   CT Chest High Resolution    -High-res CT with supine and prone positioning -inspiratory and expiratory cuts.  -Only to be read by Dr. Rosario Jacks and Dr. Weber Cooks.    Standing Status:   Future    Standing Expiration Date:   09/03/2021    Scheduling Instructions:     Schedule 08/2021    Order Specific Question:   Is patient pregnant?    Answer:   No    Order Specific Question:   Preferred imaging location?    Answer:   External    Comments:   [place]   Flu Vaccine QUAD 36+ mos IM (Fluarix, Quad PF)   ANA,IFA RA Diag Pnl w/rflx Tit/Patn    Standing Status:   Future    Number of Occurrences:   1    Standing Expiration Date:   09/03/2021   MyoMarker 3 Plus Profile (RDL)    Standing Status:  Future    Number of Occurrences:   1    Standing Expiration Date:   09/03/2021   Sjogren's syndrome antibods(ssa + ssb)    Standing Status:   Future    Number of Occurrences:   1    Standing Expiration Date:   09/03/2021   ANCA Screen Reflex Titer    Standing Status:   Future    Number of Occurrences:   1    Standing Expiration Date:   09/03/2021   Hypersensitivity Pneumonitis    Standing Status:   Future    Number of Occurrences:   1    Standing Expiration Date:   09/03/2021   Anti-Scleroderma Antibody    Standing Status:   Future    Number of Occurrences:   1    Standing Expiration Date:   09/03/2021   Ambulatory referral to Rheumatology    Referral Priority:   Routine    Referral Type:   Consultation    Referral Reason:   Specialty Services Required    Requested Specialty:   Rheumatology    Number of Visits Requested:   1   Pulmonary function test    Standing Status:   Future    Standing Expiration Date:   09/03/2021    Order Specific Question:    Where should this test be performed?    Answer:   [facility]    Order Specific Question:   Full PFT: includes the following: basic spirometry, spirometry pre & post bronchodilator, diffusion capacity (DLCO), lung volumes    Answer:   Full PFT   Orders Placed This Encounter  Procedures   CT Chest High Resolution   Flu Vaccine QUAD 36+ mos IM (Fluarix, Quad PF)   ANA,IFA RA Diag Pnl w/rflx Tit/Patn   MyoMarker 3 Plus Profile (RDL)   Sjogren's syndrome antibods(ssa + ssb)   ANCA Screen Reflex Titer   Hypersensitivity Pneumonitis   Anti-Scleroderma Antibody   Ambulatory referral to Rheumatology   Pulmonary function test   Meds ordered this encounter  Medications   nicotine (NICODERM CQ) 21 mg/24hr patch    Sig: Place 1 patch (21 mg total) onto the skin daily.    Dispense:  28 patch    Refill:  3   nicotine polacrilex (COMMIT) 4 MG lozenge    Sig: Take 1 lozenge (4 mg total) by mouth as needed for smoking cessation.    Dispense:  100 tablet    Refill:  3    Follow Up:    Return in about 6 weeks (around 10/15/2020), or if symptoms worsen or fail to improve, for Follow up with Dr. Purnell Shoemaker, ILD clinic - 37min slot, Follow up for FULL PFT - 60 min. 32 - ILD CLINIC SLOT      Notification of test results are managed in the following manner: If there are  any recommendations or changes to the  plan of care discussed in office today,  we will contact you and let you know what they are. If you do not hear from [place], then your results are normal and you can view them through your  MyChart account , or a letter will be sent to you. Thank you again for trusting [place] with your care  - Thank you, [facility]    It is flu season:   >>> Best ways to protect herself from the flu: Receive the yearly flu vaccine, practice good hand hygiene washing with soap and also using hand sanitizer when available,  eat a nutritious meals, get adequate rest, hydrate  appropriately       Please contact the office if your symptoms worsen or you have concerns that you are not improving.   Thank you for choosing [facility] for your healthcare, and for allowing [place] to partner with you on your healthcare journey. I am thankful to be able to provide care to you today.   Wyn Quaker FNP-C

## 2020-09-03 NOTE — Assessment & Plan Note (Signed)
Plan: ILD labs today ILD questionnaire provided for patient Pulmonary function testing ordered We will repeat high-resolution CT chest next year in October/2022 Referral to rheumatology Walk today in office

## 2020-09-03 NOTE — Assessment & Plan Note (Signed)
Multifactorial dyspnea on exertion given patient's current smoker status, morbid obesity, interstitial lung disease, potential COPD, physical deconditioning  Plan: Start Stiolto trial Pulmonary function testing ordered

## 2020-09-03 NOTE — Assessment & Plan Note (Signed)
Current smoker Smoking 1.5 packs/day Previous success with nicotine replacement therapies  Plan: Prescribed 21 mg nicotine patch today Prescribed 4 mm gram nicotine lozenge today Emphasized need for the patient stop smoking Flu vaccine provided today Pulmonary function testing ordered today

## 2020-09-03 NOTE — Progress Notes (Signed)
@Patient  ID: Lacey Erickson, female    DOB: 07-23-1976, 44 y.o.   MRN: [id number]  Chief Complaint  Patient presents with  . Follow-up    DOE, still SOB, canno    Referring provider: Raina Mina., MD  HPI:  44 year old female current everyday smoker followed in our office for dyspnea on exertion, ILD  PMH: GERD, rheumatoid arthritis, history of nasal polyps, history of obstructive sleep apnea Smoker/ Smoking History: Current smoker smoking 1/2 packs/day.  49.5-pack-year smoking history Maintenance: None Pt of: Dr. Chase Caller  09/03/2020  - Visit   44 year old female current everyday smoker followed in our office for dyspnea on exertion interstitial lung disease.  She initially was consulted with Dr. Chase Caller on 08/13/2020.  Patient had lab work that showed positive Rast allergy panel to ragweed.  Mildly elevated IgE of 111 and mildly elevated CBC with differential with eosinophil count of 300.  Patient felt significantly better when on steroids.  She did not tolerate starting Symbicort.  She felt jittery.  She has not tolerated Spiriva HandiHaler.  She does not like DPI's.  She has been managed by many other specialist in the past.  Last pulmonary specialist was Dr. Carren Rang in [place].  She reports that she had a breathing test and a sleep study done there.  She is unsure the results.  She reports that she did have obstructive sleep apnea and was started on CPAP therapy but she could not tolerate.  She also has a history of rheumatoid arthritis.  Last managed by rheumatology at [facility] Dr. Eliberto Ivory in 2018.  She is currently not seeing any providers for management of her RA.  Last assessment and plan from February/2018 office visit with Dr. Eliberto Ivory is listed below:  Feb/ 2018 ASSESSMENT: 44 year old female with rheumatoid arthritis on Arava 10 mg daily. She is improved but still has active arthritis on examination. I think it is reasonable to increase her Arava to 20 mg daily to see  if this will better control her arthritis. We can do a short course of prednisone for symptomatic relief given her tender and swollen joints on exam today. Not sure that [place] alone will be sufficient but she would like to stay off of TNF at this time. Will check toxicity labs today especially given her history of elevated LFTs. Of note, she has noted some mild alopecia and suspect that this is from the leflunomide. Typically the side effect is transient and so will continue the medication at this time. She had a bone density done October 2017 that showed osteopenia. Her flax was low. Encouraged calcium and vitamin D as well as weightbearing exercise. We will try to minimize prednisone.  PLAN: 1. Increase Arava to 20 mg daily. Toxicity labs today and every 3 months. 2. Short course of prednisone for symptomatic relief. 3. Bone density done 08/2016 shows osteopenia continue calcium and vitamin D. 4. Return to clinic in 4 months.   Patient also reporting that last year she had nasal polyps removed by an ENT/surgeon in [place].  We do not have these records.  We will try to request these today.  Patient reports that she has persistent rhinitis symptoms.  She does take Claritin daily.  Sometimes she does take Benadryl.  She does not use any nasal meds.  She continues to have significant dyspnea on exertion as well as lower extremity swelling.  She reports her primary care recently refilled her diuretics.  But at a lower dose.  She is  unsure why.  She is currently changing primary care providers and is planning on establishing with Dr. Salvadore Oxford.  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  Recommending repeat high-resolution CT chest in 1 year.  Patient would like flu vaccine if available today  Questionaires / Pulmonary Flowsheets:   ACT:  No flowsheet data found.  MMRC: mMRC Dyspnea Scale mMRC Score  09/03/2020 3    Epworth:  No flowsheet data found.  Tests:    08/28/2020-high-resolution CT chest-appearance of lungs may suggest interstitial lung disease with spectrum of findings considered most compatible with an alternative diagnosis to UIP, primary differential consideration is that of nonspecific interstitial pneumonia, repeat high-resolution CT chest in 12 months to assess for changes, 4 mm nodule in the left upper lobe, aortic arthrosclerosis  FENO:  Lab Results  Component Value Date   NITRICOXIDE 5 08/13/2020    PFT: No flowsheet data found.  WALK:  No flowsheet data found.  Imaging: No results found.  Lab Results:  CBC    Component Value Date/Time   WBC 13.7 (H) 08/13/2020 1448   RBC 4.95 08/13/2020 1448   HGB 13.0 08/13/2020 1448   HCT 40.9 08/13/2020 1448   PLT 288.0 08/13/2020 1448   MCV 82.6 08/13/2020 1448   MCHC 31.8 08/13/2020 1448   RDW 17.6 (H) 08/13/2020 1448   LYMPHSABS 3.2 08/13/2020 1448   MONOABS 0.7 08/13/2020 1448   EOSABS 0.3 08/13/2020 1448   BASOSABS 0.1 08/13/2020 1448    BMET    Component Value Date/Time   NA 141 12/11/2018 0906   K 3.9 12/11/2018 0906   CL 108 12/11/2018 0906   CO2 21 12/11/2018 0906   GLUCOSE 97 12/11/2018 0906   BUN 16 12/11/2018 0906   CREATININE 1.19 12/11/2018 0906   CALCIUM 9.7 12/11/2018 0906    BNP No results found for: BNP  ProBNP No results found for: PROBNP  Specialty Problems      Pulmonary Problems   Allergic rhinitis   Cough   Dyspnea on exertion   ILD (interstitial lung disease) (HCC)      Allergies  Allergen Reactions  . Adalimumab Other (See Comments)    Cervical Cancer  . Baclofen Other (See Comments)  . Etanercept Other (See Comments)  . Plaquenil [Hydroxychloroquine]     alopecia  . Topiramate Other (See Comments)    Numbness and tingling Numbness and tingling Numbness and tingling   . Armodafinil Palpitations    Immunization History  Administered Date(s) Administered  . Influenza,inj,Quad PF,6+ Mos 09/03/2020  . PFIZER  SARS-COV-2 Vaccination 02/13/2020, 03/09/2020    Past Medical History:  Diagnosis Date  . Anxiety and depression   . Bipolar disorder ([place])   . Cervical cancer ([place])   . Chronic respiratory failure ([place])   . Diverticulitis   . Elevated LFTs   . Fibromyalgia   . GERD (gastroesophageal reflux disease)   . Hepatic steatosis   . Hyperlipemia   . Ileus ([place])   . Osteopenia   . Oxygen dependent   . RHA (rheumatoid arthritis) ([place])     Tobacco History: Social History   Tobacco Use  Smoking Status Current Every Day Smoker  . Packs/day: 1.50  . Years: 33.00  . Pack years: 49.50  . Types: Cigarettes  Smokeless Tobacco Never Used  Tobacco Comment   currently smoking 1.5ppd   Ready to quit: No Counseling given: Yes Comment: currently smoking 1.5ppd  Smoking assessment and cessation counseling  Patient currently  smoking: 1.5 ppd  I have advised the patient to quit/stop smoking as soon as possible due to high risk for multiple medical problems.  It will also be very difficult for [place] to manage patient's  respiratory symptoms and status if we continue to expose her lungs to a known irritant.  We do not advise e-cigarettes as a form of stopping smoking.  Patient is  willing to quit smoking.  Patient has previously had success quitting smoking before by using nicotine replacement therapies.  She is willing to do this again.  I have advised the patient that we can assist and have options of nicotine replacement therapy, provided smoking cessation education today, provided smoking cessation counseling, and provided cessation resources.  Follow-up next office visit office visit for assessment of smoking cessation.    Smoking cessation counseling advised for: 25min    Outpatient Encounter Medications as of 09/03/2020  Medication Sig  . albuterol (PROVENTIL HFA) 108 (90 Base) MCG/ACT inhaler Inhale into the lungs.  . diclofenac Sodium (VOLTAREN) 1 % GEL Apply 1 application  topically 4 (four) times daily.  Marland Kitchen estrogens, conjugated, (PREMARIN) 0.625 MG tablet Take by mouth.  . fluvoxaMINE (LUVOX) 100 MG tablet Take 100 mg by mouth 2 (two) times daily.  . furosemide (LASIX) 20 MG tablet Take 20 mg by mouth daily.  Marland Kitchen gabapentin (NEURONTIN) 800 MG tablet Take 800 mg by mouth 3 (three) times daily.  . methocarbamol (ROBAXIN) 750 MG tablet Take 750 mg by mouth 3 (three) times daily.  . mirtazapine (REMERON) 15 MG tablet Take 15 mg by mouth at bedtime.  Marland Kitchen omeprazole (PRILOSEC) 20 MG capsule Take by mouth.  . ondansetron (ZOFRAN ODT) 4 MG disintegrating tablet Take 1 tablet (4 mg total) by mouth every 8 (eight) hours as needed for nausea or vomiting.  . ondansetron (ZOFRAN) 4 MG tablet Take 1 tablet (4 mg total) by mouth every 6 (six) hours as needed for nausea or vomiting.  . Oxcarbazepine (TRILEPTAL) 300 MG tablet Take 300 mg by mouth at bedtime.  . prazosin (MINIPRESS) 1 MG capsule Take 1 mg by mouth at bedtime.  . rizatriptan (MAXALT) 10 MG tablet Take 10 mg by mouth as needed for migraine. May repeat in 2 hours if needed  . rosuvastatin (CRESTOR) 10 MG tablet Take 10 mg by mouth daily.  . Vitamin D, Ergocalciferol, (DRISDOL) 1.25 MG (50000 UNIT) CAPS capsule Take 50,000 Units by mouth every 7 (seven) days.  . [DISCONTINUED] predniSONE (DELTASONE) 10 MG tablet Take 4x1day, 3x1day, 2x1day, 1x1day, 0.5x1day then stop  . budesonide-formoterol (SYMBICORT) 160-4.5 MCG/ACT inhaler Inhale 2 puffs into the lungs in the morning and at bedtime. (Patient not taking: Reported on 09/03/2020)  . nicotine (NICODERM CQ) 21 mg/24hr patch Place 1 patch (21 mg total) onto the skin daily.  . nicotine polacrilex (COMMIT) 4 MG lozenge Take 1 lozenge (4 mg total) by mouth as needed for smoking cessation.  . Tiotropium Bromide Monohydrate (SPIRIVA RESPIMAT) 2.5 MCG/ACT AERS Inhale 2 puffs into the lungs daily. (Patient not taking: Reported on 09/03/2020)   No facility-administered encounter  medications on file as of 09/03/2020.     Review of Systems  Review of Systems  Constitutional: Positive for fatigue. Negative for activity change and fever.  HENT: Positive for postnasal drip and rhinorrhea. Negative for sinus pressure, sinus pain and sore throat.   Respiratory: Positive for cough and shortness of breath. Negative for wheezing.   Cardiovascular: Negative for chest pain and palpitations.  Gastrointestinal: Negative  for diarrhea, nausea and vomiting.  Musculoskeletal: Negative for arthralgias.  Neurological: Negative for dizziness.  Psychiatric/Behavioral: Negative for sleep disturbance. The patient is not nervous/anxious.      Physical Exam  BP 120/70 (BP Location: Left Arm, Cuff Size: Normal)   Pulse 84   Temp 98.5 F (36.9 C) (Oral)   Ht 5\' 2"  (1.575 m)   Wt 254 lb 10.4 oz (115.5 kg)   SpO2 94%   BMI 46.58 kg/m   Wt Readings from Last 5 Encounters:  09/03/20 254 lb 10.4 oz (115.5 kg)  08/13/20 252 lb 12.8 oz (114.7 kg)  11/22/18 200 lb (90.7 kg)  06/06/18 220 lb 8 oz (100 kg)  05/22/18 220 lb 8 oz (100 kg)    BMI Readings from Last 5 Encounters:  09/03/20 46.58 kg/m  08/13/20 46.24 kg/m  11/22/18 36.58 kg/m  06/06/18 40.33 kg/m  05/22/18 40.33 kg/m     Physical Exam Vitals and nursing note reviewed.  Constitutional:      General: She is not in acute distress.    Appearance: Normal appearance. She is obese.  HENT:     Head: Normocephalic and atraumatic.     Right Ear: Tympanic membrane, ear canal and external ear normal. There is no impacted cerumen.     Left Ear: Tympanic membrane, ear canal and external ear normal. There is no impacted cerumen.     Nose: Rhinorrhea present. No congestion.     Mouth/Throat:     Mouth: Mucous membranes are moist.     Pharynx: Oropharynx is clear.     Comments: Postnasal drip Eyes:     Pupils: Pupils are equal, round, and reactive to light.  Cardiovascular:     Rate and Rhythm: Normal rate and  regular rhythm.     Pulses: Normal pulses.     Heart sounds: Normal heart sounds. No murmur heard.   Pulmonary:     Breath sounds: No decreased air movement. No decreased breath sounds, wheezing or rales.  Musculoskeletal:     Cervical back: Normal range of motion.     Right lower leg: 2+ Pitting Edema present.     Left lower leg: 2+ Pitting Edema present.  Skin:    General: Skin is warm and dry.     Capillary Refill: Capillary refill takes less than 2 seconds.  Neurological:     General: No focal deficit present.     Mental Status: She is alert and oriented to person, place, and time. Mental status is at baseline.     Gait: Gait normal.  Psychiatric:        Mood and Affect: Mood normal.        Behavior: Behavior normal.        Thought Content: Thought content normal.        Judgment: Judgment normal.       Assessment & Plan:   ILD (interstitial lung disease) ([place]) Plan: ILD labs today ILD questionnaire provided for patient Pulmonary function testing ordered We will repeat high-resolution CT chest next year in October/2022 Referral to rheumatology Walk today in office  Allergic rhinitis History of nasal polyps Persistent rhinitis symptoms Positive for ragweed on RAST panel  Plan: Continue daily Claritin Can use Chlortab or Benadryl at night to help with postnasal drip Start Flonase Continue to clinically monitor, may need to consider biologic therapies in the future  Gastroesophageal reflux disease Plan: Continue omeprazole 20 mg twice daily May need to consider increasing omeprazole dosing given patient's  persistent reflux symptoms Patient needs to stop smoking Emphasized importance of following GERD lifestyle diet Encourage patient to schedule follow-up with current gastroenterologist Dr. Lyndel Safe  Rheumatoid arthritis [facility]) Last managed by rheumatology in 2018 at [facility] Dr. Eliberto Ivory Previously managed by Dr. Amil Amen No current rheumatologist Potential ILD  seen on high-resolution CT chest  Plan: Referral to rheumatology to establish care with Dr. Merilynn Finland Current smoker Smoking 1.5 packs/day Previous success with nicotine replacement therapies  Plan: Prescribed 21 mg nicotine patch today Prescribed 4 mm gram nicotine lozenge today Emphasized need for the patient stop smoking Flu vaccine provided today Pulmonary function testing ordered today  Healthcare maintenance Plan: Flu vaccine today  History of nasal polypectomy History of nasal polyp removal last year per the patient  Plan: We will request records from [place] E[facility]/surgeon  Dyspnea on exertion Multifactorial dyspnea on exertion given patient's current smoker status, morbid obesity, interstitial lung disease, potential COPD, physical deconditioning  Plan: Start Stiolto trial Pulmonary function testing ordered   Cough Cough is likely multifactorial given known ILD, persistent uncontrolled acid reflux and allergic rhinitis symptoms  Plan: Pulmonary function testing ordered today Follow GERD lifestyle diet and follow GERD medications recommended by gastroenterology Schedule follow-up with gastroenterology Start daily antihistamine Start Flonase Can use chlor tabs or Benadryl at night to help with postnasal drip Emphasized need for the patient stop smoking  At risk for obstructive sleep apnea Patient is at risk for obstructive sleep apnea She reports that she had a sleep study that showed sleep apnea and she was intolerant of CPAP  Plan: May need to consider home sleep study in the future We will request records from Dr. Gardenia Phlegm    Return in about 6 weeks (around 10/15/2020), or if symptoms worsen or fail to improve, for Follow up with Dr. Purnell Shoemaker, Follow up for FULL PFT - 60 min, Follow up with Wyn Quaker FNP-C.   Lauraine Rinne, NP 09/03/2020   This appointment required 65 minutes of patient care (this includes precharting, chart review,  review of results, face-to-face care, etc.).

## 2020-09-03 NOTE — Assessment & Plan Note (Signed)
Patient is at risk for obstructive sleep apnea She reports that she had a sleep study that showed sleep apnea and she was intolerant of CPAP  Plan: May need to consider home sleep study in the future We will request records from Dr. Gardenia Phlegm

## 2020-09-03 NOTE — Assessment & Plan Note (Signed)
History of nasal polyps Persistent rhinitis symptoms Positive for ragweed on RAST panel  Plan: Continue daily Claritin Can use Chlortab or Benadryl at night to help with postnasal drip Start Flonase Continue to clinically monitor, may need to consider biologic therapies in the future

## 2020-09-03 NOTE — Assessment & Plan Note (Signed)
History of nasal polyp removal last year per the patient  Plan: We will request records from [place] [facility]/surgeon

## 2020-09-03 NOTE — Assessment & Plan Note (Signed)
Last managed by rheumatology in 2018 at [facility] Dr. Eliberto Ivory Previously managed by Dr. Amil Amen No current rheumatologist Potential ILD seen on high-resolution CT chest  Plan: Referral to rheumatology to establish care with Dr. Amil Amen

## 2020-09-07 ENCOUNTER — Telehealth: Payer: Self-pay | Admitting: Pulmonary Disease

## 2020-09-07 NOTE — Telephone Encounter (Signed)
09/07/2020  Records have been received from [place] surgical clinic/ENT:   02/24/2020-right ethmoid lesion-biopsy-chronic sinusitis, mild, sinonasal inflammatory polyp x2 >>>No carcinoma found  These records have been signed and added to the patient's chart patient was found to have chronic pansinusitis, deviated nasal septum as well as hypertrophy of nasal turbinates.  She had a septoplasty, bilateral inferior turbinoplasty, right maxillary osteotomies and total right ethmoidectomies and right frontal recess dissection with stereo guidance and removal of tissue on 02/24/2020.  Wyn Quaker FNP

## 2020-09-08 LAB — ANA,IFA RA DIAG PNL W/RFLX TIT/PATN
Anti Nuclear Antibody (ANA): NEGATIVE
Cyclic Citrullin Peptide Ab: <16 UNITS
Rheumatoid fact SerPl-aCnc: <14 IU/mL (ref <14)

## 2020-09-08 LAB — SJOGREN'S SYNDROME ANTIBODS(SSA + SSB)
SSA (Ro) (ENA) Antibody, IgG: <1 AI
SSB (La) (ENA) Antibody, IgG: <1 AI

## 2020-09-08 LAB — ANTI-SCLERODERMA ANTIBODY: Scleroderma (Scl-70) (ENA) Antibody, IgG: <1 AI

## 2020-09-08 LAB — ANCA SCREEN W REFLEX TITER: ANCA Screen: NEGATIVE

## 2020-09-11 LAB — HYPERSENSITIVITY PNEUMONITIS
A. Pullulans Abs: NEGATIVE
A.Fumigatus #1 Abs: NEGATIVE
Micropolyspora faeni, IgG: NEGATIVE
Pigeon Serum Abs: NEGATIVE
Thermoact. Saccharii: NEGATIVE
Thermoactinomyces vulgaris, IgG: NEGATIVE

## 2020-09-17 LAB — MYOMARKER 3 PLUS PROFILE (RDL)

## 2020-09-21 ENCOUNTER — Ambulatory Visit: Payer: Medicaid Other | Admitting: Gastroenterology

## 2020-09-21 ENCOUNTER — Encounter: Payer: Self-pay | Admitting: Gastroenterology

## 2020-09-21 ENCOUNTER — Telehealth: Payer: Self-pay | Admitting: Gastroenterology

## 2020-09-21 NOTE — Telephone Encounter (Signed)
I have called and left a message for patient to return my call.  

## 2020-10-09 ENCOUNTER — Telehealth: Payer: Self-pay | Admitting: Pulmonary Disease

## 2020-10-09 NOTE — Telephone Encounter (Signed)
10/09/2021 1  Received notification from [facility] rheumatology that patient's rheumatology referral was closed.  Apparently per this documentation it was reviewed and they were waiting on patient's previous rheumatology records which they had not received.  We will send this to scan and imaging.  We will route to [facility] to see if referral can be sent to other rheumatology offices.  Wyn Quaker, FNP

## 2020-10-14 NOTE — Telephone Encounter (Signed)
I have sent the referral to [facility].  Per their office, the doctor will review the chart & will advise if the referral is accepted or not.

## 2020-10-15 ENCOUNTER — Ambulatory Visit: Payer: Medicaid Other | Admitting: Pulmonary Disease

## 2020-10-27 ENCOUNTER — Ambulatory Visit: Payer: Medicaid Other | Admitting: Pulmonary Disease

## 2020-10-27 ENCOUNTER — Ambulatory Visit: Payer: Medicaid Other | Admitting: Adult Health

## 2021-01-09 ENCOUNTER — Telehealth: Payer: Self-pay | Admitting: Internal Medicine

## 2021-01-09 NOTE — Telephone Encounter (Signed)
Last seen by Lacey Erickson in October 2021.  She has some ILD with autoimmune possibly.  Plan -Spirometry and DLCO appointment and 30-minute visit to see me first available April or May 2022

## 2021-01-11 ENCOUNTER — Other Ambulatory Visit: Payer: Self-pay | Admitting: *Deleted

## 2021-01-11 DIAGNOSIS — J849 Interstitial pulmonary disease, unspecified: Secondary | ICD-10-CM

## 2021-01-11 NOTE — Telephone Encounter (Signed)
Called and spoke with pt and have scheduled her for the covid test, PFT, and OV after with MR. Nothing further needed.

## 2021-02-19 ENCOUNTER — Other Ambulatory Visit (HOSPITAL_COMMUNITY): Payer: Medicare Other | Attending: Internal Medicine

## 2021-02-23 ENCOUNTER — Other Ambulatory Visit: Payer: Self-pay

## 2021-02-23 ENCOUNTER — Ambulatory Visit: Payer: Medicaid Other | Admitting: Internal Medicine

## 2021-02-23 ENCOUNTER — Ambulatory Visit: Payer: Medicare Other

## 2021-03-07 HISTORY — PX: FINGER SURGERY: SHX640

## 2021-03-29 ENCOUNTER — Other Ambulatory Visit (HOSPITAL_COMMUNITY)
Admission: RE | Admit: 2021-03-29 | Discharge: 2021-03-29 | Disposition: A | Discharge status: Home / Self Care | Payer: Medicare Other | Source: Ambulatory Visit | Attending: Internal Medicine | Admitting: Internal Medicine

## 2021-03-29 DIAGNOSIS — Z20822 Contact with and (suspected) exposure to covid-19: Secondary | ICD-10-CM | POA: Insufficient documentation

## 2021-03-29 DIAGNOSIS — Z01812 Encounter for preprocedural laboratory examination: Secondary | ICD-10-CM | POA: Insufficient documentation

## 2021-03-29 LAB — SARS CORONAVIRUS 2 (TAT 6-24 HRS): SARS Coronavirus 2: NEGATIVE

## 2021-04-01 ENCOUNTER — Ambulatory Visit (INDEPENDENT_AMBULATORY_CARE_PROVIDER_SITE_OTHER): Payer: Medicare Other | Admitting: Internal Medicine

## 2021-04-01 ENCOUNTER — Encounter: Payer: Self-pay | Admitting: Internal Medicine

## 2021-04-01 ENCOUNTER — Other Ambulatory Visit: Payer: Self-pay

## 2021-04-01 ENCOUNTER — Telehealth: Payer: Self-pay | Admitting: Internal Medicine

## 2021-04-01 VITALS — BP 130/80 | HR 78 | Temp 98.3°F | Ht 65.0 in | Wt 229.0 lb

## 2021-04-01 DIAGNOSIS — Z8739 Personal history of other diseases of the musculoskeletal system and connective tissue: Secondary | ICD-10-CM

## 2021-04-01 DIAGNOSIS — J849 Interstitial pulmonary disease, unspecified: Secondary | ICD-10-CM

## 2021-04-01 DIAGNOSIS — R0600 Dyspnea, unspecified: Secondary | ICD-10-CM

## 2021-04-01 DIAGNOSIS — F17200 Nicotine dependence, unspecified, uncomplicated: Secondary | ICD-10-CM

## 2021-04-01 DIAGNOSIS — K219 Gastro-esophageal reflux disease without esophagitis: Secondary | ICD-10-CM | POA: Diagnosis not present

## 2021-04-01 DIAGNOSIS — W6109XA Other contact with parrot, initial encounter: Secondary | ICD-10-CM

## 2021-04-01 DIAGNOSIS — R059 Cough, unspecified: Secondary | ICD-10-CM

## 2021-04-01 DIAGNOSIS — R0609 Other forms of dyspnea: Secondary | ICD-10-CM

## 2021-04-01 NOTE — Addendum Note (Signed)
Addended by: Lorretta Harp on: 04/01/2021 02:29 PM   Modules accepted: Orders

## 2021-04-01 NOTE — Telephone Encounter (Signed)
Raquel Sarna  Pls find ut from Geanie Logan -how long she has had the African gray bird parrot?  Which room visiting?Marland Kitchen  Who cleans the cage?  It is really important she get a high-resolution CT chest here in [place] supine and prone and inspiratory and expiratory volume

## 2021-04-01 NOTE — Patient Instructions (Addendum)
    ICD-10-CM   1. ILD (interstitial lung disease) ([place])  J84.9   2. Gastroesophageal reflux disease, unspecified whether esophagitis present  K21.9   3. Smoker  F17.200   4. History of rheumatoid arthritis  Z87.39   5. Other contact with parrot, initial encounter  W61.09XA   6. Cough  R05.9   7. Dyspnea on exertion  R06.00    The condition you have is called interstitial lung disease otherwise call pulmonary fibrosis  There are more than 1 variety to this  In your particular case it could be related to rheumatoid arthritis [although surprisingly your rheumatoid factor is negative], smoking, acid reflux or some unexplained reason, or bird related condition called chronic HP  You might well need a biopsy to sort out why you have this condition  Plan - However before we truly decide that you need a biopsy and in order to better understand your condition please do the following  -Please see a rheumatologist as soon as possible  = Do simple walking desaturation test today  -Fill out ILD questionnaire today  -Do high-resolution CT chest supine and prone within the next few weeks   - do in [place] so we can make better decisions on biospy or not  -Do overnight oxygen study on room air  - stop lisinopril- can mak eyou couhg  Follow-up - July 2022 for a face-to-face visit Dr. Chase Caller but after completing all of the above  -15-minute slot okay  - inqiore more about bird exposure at home  - consider bronch v SLB based on above

## 2021-04-01 NOTE — Progress Notes (Signed)
OV 08/13/2020  Subjective:  Patient ID: Lacey Erickson, female , DOB: [birth year] , age 45 y.o. , MRN: [id number] , ADDRESS: [address] [place] [address] PCP Raina Mina., MD Relevant Other Providers: - x This Provider for this visit: Treatment Team:  Attending Provider: Brand Males, MD    08/13/2020 -   Chief Complaint  Patient presents with  . Consult    Pt being referred due to having fluid on lungs via cxr. Pt has complaints of SOB, cough, wheezing, and chest tightness.     HPI Lacey Erickson 45 y.o. -presents with her husband.  Referred by primary care specialist.  She lives near [place], [place].  She tells me that at baseline she is disabled because of bipolar and fibromyalgia and osteoarthritis and prediabetes.  She also carries a diagnosis of rheumatoid arthritis not otherwise specified made many years ago.  But she is not on any immunomodulators.  She also suffers from obesity.  She is a chronic smoker.  Then approximately 6 months ago developed insidious onset of shortness of breath, cough and wheezing.  All the symptoms of present and made worse by exertion and relieved by rest.  The wheezing and cough also wake her up in the middle of the night.  She can just walk across to the bathroom and get worse with all the symptoms coming on together.  In addition she is also reporting a chest pain that feels like a "elephant in the chest" but she denies any previous heart attack.  She says approximately 3 to 4 months ago she was admitted to [facility] because chest x-ray showed fluid in her chest and lungs not otherwise specified.  She says " nothing much" was done at the hospital and then she was discharged.  However symptoms have persisted and is getting worse.  Symptoms are rated as moderate to severe overall.  She has tried Spiriva for her symptoms it does not help.  She has been on multiple prednisone courses and this is helped.  Not been on any inhaled  steroids.  She denies any mold or mildew exposure.  She denies any vaping or electronic cigarette use or cocaine use.  Only substance of abuse is tobacco.  She denies any occupational exposure to mold.  FENO is 5 and  Heavy smoking 2.5 pack x 33 years  Coronary artery disease risk factors include smoking, obesity, prediabetes and hyperlipidemia  Results for Lacey Erickson (MRN [id number]) as of 08/13/2020 13:57  Ref. Range 05/22/2018 14:45 11/22/2018 12:14 12/11/2018 09:06  Creatinine Latest Ref Range: 0.40 - 1.20 mg/dL 1.12 0.90 1.19  Results for Lacey Erickson (MRN [id number]) as of 08/13/2020 13:57  Ref. Range 12/11/2018 09:06  Hemoglobin Latest Ref Range: 12.0 - 15.0 g/dL 13.6   Results for Lacey Erickson (MRN [id number]) as of 08/13/2020 13:57  Ref. Range 12/11/2018 09:06  Eosinophils Absolute Latest Ref Range: 0 - 0 K/uL 0.4     09/03/2020  - Visit   45 year old female current everyday smoker followed in our office for dyspnea on exertion interstitial lung disease.  She initially was consulted with Dr. Chase Caller on 08/13/2020.  Patient had lab work that showed positive Rast allergy panel to ragweed.  Mildly elevated IgE of 111 and mildly elevated CBC with differential with eosinophil count of 300.  Patient felt significantly better when on steroids.  She did not tolerate starting Symbicort.  She felt jittery.  She has not tolerated Spiriva HandiHaler.  She  does not like DPI's.  She has been managed by many other specialist in the past.  Last pulmonary specialist was Dr. Carren Rang in [place].  She reports that she had a breathing test and a sleep study done there.  She is unsure the results.  She reports that she did have obstructive sleep apnea and was started on CPAP therapy but she could not tolerate.  She also has a history of rheumatoid arthritis.  Last managed by rheumatology at [facility] Dr. Eliberto Ivory in 2018.  She is currently not seeing any providers for management of her RA.  Last  assessment and plan from February/2018 office visit with Dr. Eliberto Ivory is listed below:  Feb/ 2018 ASSESSMENT: 45 year old female with rheumatoid arthritis on Arava 10 mg daily. She is improved but still has active arthritis on examination. I think it is reasonable to increase her Arava to 20 mg daily to see if this will better control her arthritis. We can do a short course of prednisone for symptomatic relief given her tender and swollen joints on exam today. Not sure that [place] alone will be sufficient but she would like to stay off of TNF at this time. Will check toxicity labs today especially given her history of elevated LFTs. Of note, she has noted some mild alopecia and suspect that this is from the leflunomide. Typically the side effect is transient and so will continue the medication at this time. She had a bone density done October 2017 that showed osteopenia. Her flax was low. Encouraged calcium and vitamin D as well as weightbearing exercise. We will try to minimize prednisone.  PLAN: 1. Increase Arava to 20 mg daily. Toxicity labs today and every 3 months. 2. Short course of prednisone for symptomatic relief. 3. Bone density done 08/2016 shows osteopenia continue calcium and vitamin D. 4. Return to clinic in 4 months.   Patient also reporting that last year she had nasal polyps removed by an ENT/surgeon in [place].  We do not have these records.  We will try to request these today.  Patient reports that she has persistent rhinitis symptoms.  She does take Claritin daily.  Sometimes she does take Benadryl.  She does not use any nasal meds.  She continues to have significant dyspnea on exertion as well as lower extremity swelling.  She reports her primary care recently refilled her diuretics.  But at a lower dose.  She is unsure why.  She is currently changing primary care providers and is planning on establishing with Dr. Salvadore Oxford.  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  Recommending repeat high-resolution CT chest in 1 year.  Patient would like flu vaccine if available today  Questionaires / Pulmonary Flowsheets:   ACT:  No flowsheet data found.  MMRC: mMRC Dyspnea Scale mMRC Score  09/03/2020 3    Epworth:  No flowsheet data found.    08/28/2020-high-resolution CT chest-appearance of lungs may suggest interstitial lung disease with spectrum of findings considered most compatible with an alternative diagnosis to UIP, primary differential consideration is that of nonspecific interstitial pneumonia, repeat high-resolution CT chest in 12 months to assess for changes, 4 mm nodule in the left upper lobe, aortic arthrosclerosis  FENO:  Lab Results  Component Value Date   NITRICOXIDE 5 08/13/2020      No results found.   xxxxxxxxxxxxxxxxxxxxxxxxxxxxxxxxxxxxxxxxxxxxxxxxxxxxxxxxxxxxxxxxxxxx  OV 04/01/2021  Subjective:  Patient ID: Lacey Erickson, female , DOB: 05-05-1976 , age 23 y.o. , MRN: [id number] ,  ADDRESS: [address] [place] [address] PCP Maris Berger, MD Patient Care Team: Maris Berger, MD as PCP - [facility])  This Provider for this visit: Treatment Team:  Attending Provider: Brand Males, MD    04/01/2021 -   Chief Complaint  Patient presents with  . Follow-up    PFT performed today.  Pt has not been feeling good since last visit. States she has had a cough since last visit and will occ cough up phlegm. Pt also has been hoarse and has had some wheezing too.     HPI Lacey Erickson 45 y.o. -returns for follow-up.  Last time I saw her and after that she saw a nurse practitioner.  She ended up not having asthma but has ILD.  Multiple action items were given to her including seeing rheumatologist but this has not been done yet.  She is asked to quit smoking but she has not quit yet.  She did have a high-resolution CT chest but she thinks it was at [facility] or in [place].  We  only have the documentation from the nurse practitioner.  I do not have the image from that.  It is alternative diagnosis.  She had autoimmune and vasculitis profile.  This is all negative including rheumatoid factor.  She is currently here with her husband.  In summary ILD work-up is in progress.  Therefore asked her to fill out the ILD questionnaire. PFT shows estrcitn    Integrated Comprehensive ILD Questionnaire  Symptoms:   SYMPTOM SCALE - ILD 04/01/2021   O2 use *ra  Shortness of Breath 0 -> 5 scale with 5 being worst (score 6 If unable to do)  At rest 0  Simple tasks - showers, clothes change, eating, shaving 5  Household (dishes, doing bed, laundry) 6  Shopping 3  Walking level at own pace 5  Walking up Stairs 6  Total (30-36) Dyspnea Score 25 ,x 6 eyear and getting worse.   How bad is your cough?  5 - Since 2013, Worse over time  Worse lying down, clears throat,   How bad is your fatigue 5  How bad is nausea 5  How bad is vomiting?  3  How bad is diarrhea? 3  How bad is anxiety? 5  How bad is depression 5        Past Medical History :  -Positive for asthma and COPD for the last several years.  Rheumatoid arthritis for the last several years [rheumatoid factor and CCP negative here].  Positive hiatal hernia/acid reflux for the last several years.  Positive for diabetes otherwise negative.  She has had the COVID-vaccine but never had COVID not hospitalized for COVID   ROS: Positive for arthralgia, dysphagia, fatigue and dry eyes for the last several years to few months.  Also nausea and vomiting for the last several years.  Has heartburn for the last several years.  Has snoring for the last several years.   FAMILY HISTORY of LUNG DISEASE: Denies   EXPOSURE HISTORY: Smokes cigarettes since 1988.  1 pack a day.  Actually more.  She is lived in the same house.  There is a passive smoking.  No pipes no cigars.  Does not use marijuana in the form of vaping but has  smoked regular marijuana in the past.  She does not use cocaine or intravenous drugs.   HOME and HOBBY DETAILS : [place]  She lives in  a 46 year old home for the last 16 years.  Its a trailer.  She does have a African gray bird x2 there is no dampness.  No mildew or mold.  No humidifier use no CPAP use no nebulizer use no steam iron use.  No Jacuzzi use.  No misting Fountain.  No pet gerbils no feather pillows.  No mold in the [facility] duct.  No music habits no gardening habits.  No hot tub or Jacuzzi or sauna.  No straw mat.   OCCUPATIONAL HISTORY (122 questions) : Extensive review of her organic antigen history at the occupational workplace is negative.  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   PULMONARY TOXICITY HISTORY (27 items): Did take methotrexate in 2013 and prednisone in 2020.  Is currently on lisinopril.  Her husband is also on this.  They both have cough.      Simple office walk 185 feet x  3 laps goal with forehead probe 04/01/2021   O2 used ra  Number laps completed 3  Comments about pace nmdoe  Resting Pulse Ox/HR 97% and 78/min  Final Pulse Ox/HR 90% and 112/min  Desaturated </= 88% no  Desaturated <= 3% points yes  Got Tachycardic >/= 90/min yes  Symptoms at end of test x  Miscellaneous comments 1 stop       PFT  PFT Results Latest Ref Rng & Units 04/01/2021  FVC-Predicted Pre % 67  Pre FEV1/FVC % % 82  FEV1-Pre L 2.08  FEV1-Predicted Pre % 69  DLCO uncorrected ml/min/mmHg 8.98  DLCO UNC% % 40  DLCO corrected ml/min/mmHg 8.93  DLCO COR %Predicted % 40  DLVA Predicted % 54    Results for LILYANNAH, ZUELKE (MRN [id number]) as of 04/01/2021 14:02  Ref. Range 09/03/2020 15:25  Anti Nuclear Antibody (ANA) Latest Ref Range: NEGATIVE  NEGATIVE  Cyclic Citrullin Peptide Ab Latest Units: UNITS <16  RA Latex Turbid. Latest Ref Range: <14 IU/mL <14  ANA,IFA RA DIAG  PNL W/RFLX TIT/PATN Unknown Rpt  Anti-Jo-1 Ab (RDL) Latest Ref Range: <20 Units <20  Anti-PL-7 Ab (RDL) Latest Ref Range: Negative  Negative  Anti-PL-12 Ab (RDL) Latest Ref Range: Negative  Negative  Anti-EJ Ab (RDL) Latest Ref Range: Negative  Negative  Anti-OJ Ab (RDL) Latest Ref Range: Negative  Negative  Anti-SRP Ab (RDL) Latest Ref Range: Negative  Negative  Anti-Mi-2 Ab (RDL) Latest Ref Range: Negative  Negative  Anti-TIF-1gamma Ab (RDL) Latest Ref Range: <20 Units <20  Anti-MDA-5 Ab (CADM-140)(RDL) Latest Ref Range: <20 Units <20  Anti-NXP-2 (P140) Ab (RDL) Latest Ref Range: <20 Units <20  Anti-SAE1 Ab, IgG (RDL) Latest Ref Range: <20 Units <20  Anti-PM/Scl-100 Ab (RDL) Latest Ref Range: <20 Units <20  Anti-Ku Ab (RDL) Latest Ref Range: Negative  Negative  Anti-SS-A 52kD Ab, IgG (RDL) Latest Ref Range: <20 Units <20  Anti-U1 RNP Ab (RDL) Latest Ref Range: <20 Units <20  Anti-U2 RNP Ab (RDL) Latest Ref Range: Negative  Negative  Anti-U3 RNP (Fibrillarin)(RDL) Latest Ref Range: Negative  Negative  SSA (Ro) (ENA) Antibody, IgG Latest Ref Range: <1.0 NEG AI <1.0 NEG  SSB (La) (ENA) Antibody, IgG Latest Ref Range: <1.0 NEG AI <1.0 NEG  Scleroderma (Scl-70) (ENA) Antibody, IgG Latest Ref Range: <1.0 NEG AI <1.0 NEG  Results for JAMALA, KOHEN (MRN [id number]) as of 04/01/2021 14:02  Ref. Range 08/13/2020 14:48  Sheep Sorrel IgE Latest Units: kU/L <0.10  Pecan/Hickory Tree IgE Latest Units:  kU/L <0.10  IgE (Immunoglobulin E), Serum Latest Ref Range: <OR=114 kU/L 110  Allergen, D pternoyssinus,d7 Latest Units: kU/L <0.10  Cat Dander Latest Units: kU/L <0.10  Dog Dander Latest Units: kU/L <0.10  [place] Grass Latest Units: kU/L <0.10  Johnson Grass Latest Units: kU/L <0.10  Timothy Grass Latest Units: kU/L <0.10  Cockroach Latest Units: kU/L <0.10  Aspergillus fumigatus, m3 Latest Units: kU/L <0.10  Allergen, Comm Silver Wendee Copp, t9 Latest Units: kU/L <0.10  Allergen, Cottonwood, t14  Latest Units: kU/L <0.10  Elm IgE Latest Units: kU/L <0.10  Allergen, Mulberry, t76 Latest Units: kU/L <0.10  Allergen, Oak,t7 Latest Units: kU/L <0.10  COMMON RAGWEED (SHORT) (W1) IGE Latest Units: kU/L 0.34 (H)  Allergen, Mouse Urine Protein, e78 Latest Units: kU/L <0.10  D. farinae Latest Units: kU/L <0.10  Allergen, Cedar tree, t12 Latest Units: kU/L <0.10  Box Elder IgE Latest Units: kU/L <0.10  Rough Pigweed  IgE Latest Units: kU/L <0.10  A-1 Antitrypsin, Ser Latest Ref Range: 83 - 199 mg/dL 217 (H)     has a past medical history of Anxiety and depression, Bipolar disorder (HCC), Cervical cancer (HCC), Chronic respiratory failure (HCC), Diverticulitis, Elevated LFTs, Fibromyalgia, GERD (gastroesophageal reflux disease), Hepatic steatosis, Hyperlipemia, Ileus (HCC), Osteopenia, Oxygen dependent, and RHA (rheumatoid arthritis) ([place]).   reports that she has been smoking cigarettes. She has a 99.00 pack-year smoking history. She has never used smokeless tobacco.  Past Surgical History:  Procedure Laterality Date  . APPENDECTOMY    . BACK SURGERY  07/2018  . BREAST LUMPECTOMY Left    Benign  . CHOLECYSTECTOMY    . ESOPHAGOGASTRODUODENOSCOPY  02/25/2016   Small hiatal hernia. Retained food in the stomach- no evidence of gastric outlet obstruction.   Marland Kitchen TOTAL ABDOMINAL HYSTERECTOMY W/ BILATERAL SALPINGOOPHORECTOMY     Cervical Cancer    Allergies  Allergen Reactions  . Adalimumab Other (See Comments)    Cervical Cancer  . Baclofen Other (See Comments)  . Etanercept Other (See Comments)  . Plaquenil [Hydroxychloroquine]     alopecia  . Topiramate Other (See Comments)    Numbness and tingling Numbness and tingling Numbness and tingling   . Armodafinil Palpitations    Immunization History  Administered Date(s) Administered  . Influenza Split 07/16/2018, 06/25/2020, 09/03/2020  . Influenza,inj,Quad PF,6+ Mos 09/03/2020  . PFIZER(Purple Top)SARS-COV-2 Vaccination  02/13/2020, 03/09/2020    Family History  Problem Relation Age of Onset  . Colon cancer Neg Hx   . Stomach cancer Neg Hx   . Rectal cancer Neg Hx   . Esophageal cancer Neg Hx      Current Outpatient Medications:  .  albuterol (VENTOLIN HFA) 108 (90 Base) MCG/ACT inhaler, Inhale into the lungs., Disp: , Rfl:  .  budesonide-formoterol (SYMBICORT) 160-4.5 MCG/ACT inhaler, Inhale 2 puffs into the lungs in the morning and at bedtime., Disp: 10.2 g, Rfl: 6 .  buprenorphine (BUTRANS) 20 MCG/HR PTWK, Place onto the skin., Disp: , Rfl:  .  diclofenac Sodium (VOLTAREN) 1 % GEL, Apply 1 application topically 4 (four) times daily., Disp: , Rfl:  .  fluvoxaMINE (LUVOX) 100 MG tablet, Take 100 mg by mouth 2 (two) times daily., Disp: , Rfl:  .  furosemide (LASIX) 20 MG tablet, Take 20 mg by mouth daily., Disp: , Rfl:  .  gabapentin (NEURONTIN) 800 MG tablet, Take 800 mg by mouth 3 (three) times daily., Disp: , Rfl:  .  lisinopril (ZESTRIL) 5 MG tablet, Take 1 tablet by mouth daily., Disp: ,  Rfl:  .  metFORMIN (GLUCOPHAGE-XR) 500 MG 24 hr tablet, 1 daily with supper for diabetes, Disp: , Rfl:  .  methocarbamol (ROBAXIN) 750 MG tablet, Take 750 mg by mouth 3 (three) times daily., Disp: , Rfl:  .  mirtazapine (REMERON) 15 MG tablet, Take 15 mg by mouth at bedtime., Disp: , Rfl:  .  ondansetron (ZOFRAN ODT) 4 MG disintegrating tablet, Take 1 tablet (4 mg total) by mouth every 8 (eight) hours as needed for nausea or vomiting., Disp: 30 tablet, Rfl: 2 .  ondansetron (ZOFRAN) 4 MG tablet, Take 1 tablet (4 mg total) by mouth every 6 (six) hours as needed for nausea or vomiting., Disp: 30 tablet, Rfl: 2 .  Oxcarbazepine (TRILEPTAL) 300 MG tablet, Take 300 mg by mouth at bedtime., Disp: , Rfl:  .  prazosin (MINIPRESS) 1 MG capsule, Take 1 mg by mouth at bedtime., Disp: , Rfl:  .  rosuvastatin (CRESTOR) 10 MG tablet, Take 10 mg by mouth daily., Disp: , Rfl:  .  Tiotropium Bromide Monohydrate 2.5 MCG/ACT AERS,  Inhale 2 puffs into the lungs daily., Disp: , Rfl:  .  tiZANidine (ZANAFLEX) 2 MG tablet, TAKE 1 TABLET BY MOUTH EVERY 6 HOURS AS NEEDED FOR MUSCLE SPASM, Disp: , Rfl:       Objective:   Vitals:   04/01/21 1352  BP: 130/80  Pulse: 78  Temp: 98.3 F (36.8 C)  TempSrc: Temporal  SpO2: 94%  Weight: 229 lb (103.9 kg)  Height: 5\' 5"  (1.651 m)    Estimated body mass index is 38.11 kg/m as calculated from the following:   Height as of this encounter: 5\' 5"  (1.651 m).   Weight as of this encounter: 229 lb (103.9 kg).  @WEIGHTCHANGE @  [employer]   04/01/21 1352  Weight: 229 lb (103.9 kg)     Physical Exam  General: No distress. Obese. Hoarse voice Neuro: Alert and Oriented x 3. GCS 15. Speech normal Psych: Pleasant Resp:  Barrel Chest - no.  Wheeze - no, Crackles - no, No overt respiratory distress CVS: Normal heart sounds. Murmurs - no Ext: Stigmata of Connective Tissue Disease - no HEENT: Normal upper airway. PEERL +. No post nasal drip        Assessment:       ICD-10-CM   1. ILD (interstitial lung disease) ([place])  J84.9   2. Gastroesophageal reflux disease, unspecified whether esophagitis present  K21.9   3. Smoker  F17.200   4. History of rheumatoid arthritis  Z87.39   5. Other contact with parrot, initial encounter  W61.09XA   6. Cough  R05.9   7. Dyspnea on exertion  R06.00        Plan:     Patient Instructions      ICD-10-CM   1. ILD (interstitial lung disease) ([place])  J84.9   2. Gastroesophageal reflux disease, unspecified whether esophagitis present  K21.9   3. Smoker  F17.200   4. History of rheumatoid arthritis  Z87.39   5. Other contact with parrot, initial encounter  W61.09XA   6. Cough  R05.9   7. Dyspnea on exertion  R06.00    The condition you have is called interstitial lung disease otherwise call pulmonary fibrosis  There are more than 1 variety to this  In your particular case it could be related to rheumatoid arthritis [although  surprisingly your rheumatoid factor is negative], smoking, acid reflux or some unexplained reason, or bird related condition called chronic HP  You  might well need a biopsy to sort out why you have this condition  Plan - However before we truly decide that you need a biopsy and in order to better understand your condition please do the following  -Please see a rheumatologist as soon as possible  = Do simple walking desaturation test today  -Fill out ILD questionnaire today  -Do high-resolution CT chest supine and prone within the next few weeks   - do in [place] so we can make better decisions on biospy or not  -Do overnight oxygen study on room air  - stop lisinopril- can mak eyou couhg  Follow-up - July 2022 for a face-to-face visit Dr. Chase Caller but after completing all of the above  -15-minute slot okay  - inqiore more about bird exposure at home  - consider bronch v SLB based on above  ( Level 05 visit: Estb 40-54 min   in  visit type: on-site physical face to visit  in total care time and counseling or/and coordination of care by this undersigned MD - Dr Brand Males. This includes one or more of the following on this same day 04/01/2021: pre-charting, chart review, note writing, documentation discussion of test results, diagnostic or treatment recommendations, prognosis, risks and benefits of management options, instructions, education, compliance or risk-factor reduction. It excludes time spent by the [facility] or office staff in the care of the patient. Actual time 93 min)    SIGNATURE    Dr. Brand Males, M.D., F.C.C.P,  Pulmonary and Critical Care Medicine Staff Physician, [place] Director - Interstitial Lung Disease  Program  Pulmonary [place] at [place], [place], [address]  Pager: [phone], If no answer or between  15:00h - 7:00h: call [phone] Telephone: [phone]  3:36 PM 04/01/2021

## 2021-04-01 NOTE — Progress Notes (Signed)
Spirometry and Dlco done today. 

## 2021-04-06 NOTE — Telephone Encounter (Signed)
Pt was told to have the HRCT performed in [place] the day of her appt and she was fine with that. HRCT is scheduled for 04/20/21 at [place].   Attempted to call pt to ask about her African Trena Platt but unable to reach.  Left message for her to return call.

## 2021-04-06 NOTE — Telephone Encounter (Signed)
Pt returning a phone call to state she has had her bird for over a year and she does not clean its cage as her husband does. Pt can be reached at [phone].

## 2021-04-06 NOTE — Telephone Encounter (Signed)
Will route this message back to MR to make him aware of what pt stated about how long she has had her bird and that her husband cleans the cage.

## 2021-04-07 NOTE — Telephone Encounter (Signed)
Lm for patient.  

## 2021-04-07 NOTE — Telephone Encounter (Signed)
Ok wil discuss with her at followup

## 2021-04-20 ENCOUNTER — Other Ambulatory Visit: Payer: Medicaid Other

## 2021-05-10 LAB — PULMONARY FUNCTION TEST
DL/VA % pred: 54 %
DL/VA: 2.38 ml/min/mmHg/L
DLCO cor % pred: 40 %
DLCO cor: 8.93 ml/min/mmHg
DLCO unc % pred: 40 %
DLCO unc: 8.98 ml/min/mmHg
FEF 25-75 Pre: 2.54 L/sec
FEF2575-%Pred-Pre: 84 %
FEV1-%Pred-Pre: 69 %
FEV1-Pre: 2.08 L
FEV1FVC-%Pred-Pre: 100 %
FEV6-%Pred-Pre: 69 %
FEV6-Pre: 2.54 L
FEV6FVC-%Pred-Pre: 102 %
FVC-%Pred-Pre: 67 %
Pre FEV1/FVC ratio: 82 %
Pre FEV6/FVC Ratio: 100 %

## 2021-05-11 ENCOUNTER — Other Ambulatory Visit: Payer: Self-pay

## 2021-05-11 ENCOUNTER — Ambulatory Visit
Admission: RE | Admit: 2021-05-11 | Discharge: 2021-05-11 | Disposition: A | Discharge status: Home / Self Care | Payer: Medicaid Other | Source: Ambulatory Visit | Attending: Internal Medicine | Admitting: Internal Medicine

## 2021-05-11 DIAGNOSIS — J849 Interstitial pulmonary disease, unspecified: Secondary | ICD-10-CM

## 2021-05-25 ENCOUNTER — Other Ambulatory Visit: Payer: Self-pay

## 2021-05-25 ENCOUNTER — Encounter: Payer: Self-pay | Admitting: Gastroenterology

## 2021-05-25 ENCOUNTER — Ambulatory Visit (INDEPENDENT_AMBULATORY_CARE_PROVIDER_SITE_OTHER): Payer: Medicare Other | Admitting: Gastroenterology

## 2021-05-25 VITALS — BP 132/80 | HR 94 | Ht 65.0 in | Wt 222.1 lb

## 2021-05-25 DIAGNOSIS — R112 Nausea with vomiting, unspecified: Secondary | ICD-10-CM | POA: Diagnosis not present

## 2021-05-25 DIAGNOSIS — K580 Irritable bowel syndrome with diarrhea: Secondary | ICD-10-CM

## 2021-05-25 DIAGNOSIS — K219 Gastro-esophageal reflux disease without esophagitis: Secondary | ICD-10-CM

## 2021-05-25 DIAGNOSIS — Z8719 Personal history of other diseases of the digestive system: Secondary | ICD-10-CM | POA: Diagnosis not present

## 2021-05-25 DIAGNOSIS — R1013 Epigastric pain: Secondary | ICD-10-CM

## 2021-05-25 DIAGNOSIS — K449 Diaphragmatic hernia without obstruction or gangrene: Secondary | ICD-10-CM

## 2021-05-25 MED ORDER — PANTOPRAZOLE SODIUM 40 MG PO TBEC: 40.0000 mg | DELAYED_RELEASE_TABLET | Freq: Every day | ORAL | 3 refills | Status: DC

## 2021-05-25 MED ORDER — ONDANSETRON 4 MG PO TBDP: 4.0000 mg | ORAL_TABLET | Freq: Three times a day (TID) | ORAL | 2 refills | Status: DC | PRN

## 2021-05-25 MED ORDER — CLENPIQ 10-3.5-12 MG-GM -GM/160ML PO SOLN: 1.0000 | Freq: Once | ORAL | 0 refills | Status: AC

## 2021-05-25 NOTE — Patient Instructions (Addendum)
If you are age 46 or older, your body mass index should be between 23-30. Your Body mass index is 36.96 kg/m. If this is out of the aforementioned range listed, please consider follow up with your Primary Care Provider.  If you are age 70 or younger, your body mass index should be between 19-25. Your Body mass index is 36.96 kg/m. If this is out of the aformentioned range listed, please consider follow up with your Primary Care Provider.   __________________________________________________________  The [facility] providers would like to encourage you to use [facility] to communicate with providers for non-urgent requests or questions.  Due to long hold times on the telephone, sending your provider a message by [facility] may be a faster and more efficient way to get a response.  Please allow 48 business hours for a response.  Please remember that this is for non-urgent requests.   You have been scheduled for an endoscopy and colonoscopy. Please follow the written instructions given to you at your visit today. Please pick up your prep supplies at the pharmacy within the next 1-3 days. If you use inhalers (even only as needed), please bring them with you on the day of your procedure.  We have sent the following medications to your pharmacy for you to pick up at your convenience: Protonix Zofran  Please stop smoking.  Thank you,  Dr. Jackquline Denmark

## 2021-05-25 NOTE — Progress Notes (Signed)
Chief Complaint: FU  Referring Provider:  Dr Bea Graff      ASSESSMENT AND PLAN;   #1. N/V with epi pain. S/p EGD 05/2018-small hiatal hernia, mild gastritis, retained food. H/O cholecystectomy 1999 #2. H/O diverticulitis 10/2018 [facility]) and ileus 11/2018 ([facility]) #3. GERD with small HH. #4. IBS-D, excerberated by meds like metformin.  Had recent COVID-19 as well. #5. Fatty liver.  Plan: - EGD/colon (2 day prep) - Restart Protonix 40 mg p.o. once a day #30, 11 refills - Continue Zofran ODT 4 mg every 8 hours as needed #30, 2 refills. - Stop smoking. - Encouraged her to gradually lose weight.  Trend LFTs.   I discussed the nature of the recommended EGD/Colonoscopy , as well as the indications, risks, alternatives and potential complications including, but not limited to, bleeding, infection, reaction to medication, damage to internal organs, cardiac and/or pulmonary problems, and perforation requiring surgery (1 to 2 in 1000). The possibility that significant findings could be missed was explained. All ? were answered. The patient gives consent for the procedures.   HPI:    Lacey Erickson is a 45 y.o. female  With ILD, COPD, COVID (June 2022, req O2, not now), DM, RA, fibromyalgia, anxiety/depression For follow-up visit  C/O N/V x 6 months, more prominently ever since she got COVID in June 2022 requiring paxlovid/O2, then adm 6/14-6/17 at [facility].  Also complains of heartburn.  She stopped taking Protonix on her own as she ran out of prescription.  She was doing well with Protonix.   Diarrhea x several years, more prominently over last 1 year.  More so ever she has been on metformin.  She was scheduled to have colonoscopy previously but patient canceled.  She is willing to get it done this time.  She believes she cannot handle preparation.  No sodas, chocolates, chewing gums, artificial sweeteners and candy. No NSAIDs  She continues to smoke.     Review of previous  records: -Acute diverticulitis- Adm to [facility] December 2019. After D/C, was readmitted to [place] with ileus.  She had low potassium and low magnesium.  Managed conservatively by IV fluids, replacing electrolytes, minimizing pain medications and ambulating.  Did not get NG tube. -EGD 05/2018: small HH, retained food in the stomach without any gastric outlet obstruction.  Neg SB Bx for celiac in past.  -CT AP 10/2018 No acute processes in abdomen/pelvis Fatty liver L1 compression deformity.  Wt Readings from Last 3 Encounters:  05/25/21 222 lb 2 oz (100.8 kg)  04/01/21 229 lb (103.9 kg)  09/03/20 254 lb 10.4 oz (115.5 kg)     Past Medical History:  Diagnosis Date  . Anxiety and depression   . Bipolar disorder ([place])   . Chronic respiratory failure ([place])   . Elevated LFTs   . Fibromyalgia   . GERD (gastroesophageal reflux disease)   . Hepatic steatosis   . Hyperlipemia   .    Marland Kitchen RHA (rheumatoid arthritis) ([place])     Past Surgical History:  Procedure Laterality Date  . APPENDECTOMY    . BACK SURGERY  07/2018  . BREAST LUMPECTOMY Left    Benign  . CHOLECYSTECTOMY    . ESOPHAGOGASTRODUODENOSCOPY  02/25/2016   Small hiatal hernia. Retained food in the stomach- no evidence of gastric outlet obstruction.   Marland Kitchen FINGER SURGERY  03/2021   right index  . NASAL SEPTUM SURGERY  2021  . TOTAL ABDOMINAL HYSTERECTOMY W/ BILATERAL SALPINGOOPHORECTOMY     Cervical  Cancer    Family History  Problem Relation Age of Onset  . Thyroid disease Mother   . Hypertension Mother   . Diabetes Mother   . Other Father        Polio, encephalitis  . Colon cancer Neg Hx   . Stomach cancer Neg Hx   . Rectal cancer Neg Hx   . Esophageal cancer Neg Hx     Social History   Tobacco Use  . Smoking status: Every Day    Packs/day: 3.00    Years: 33.00    Pack years: 99.00    Types: Cigarettes  . Smokeless tobacco: Never  . Tobacco comments:    currently smoking 2ppd as of 04/01/21 ep  Vaping Use   . Vaping Use: Never used  Substance Use Topics  . Alcohol use: Not Currently  . Drug use: Never    Current Outpatient Medications  Medication Sig Dispense Refill  . albuterol (VENTOLIN HFA) 108 (90 Base) MCG/ACT inhaler Inhale into the lungs.    . budesonide-formoterol (SYMBICORT) 160-4.5 MCG/ACT inhaler Inhale 2 puffs into the lungs in the morning and at bedtime. 10.2 g 6  . buprenorphine (BUTRANS) 20 MCG/HR PTWK Place onto the skin.    . fluvoxaMINE (LUVOX) 100 MG tablet Take 100 mg by mouth 2 (two) times daily.    . furosemide (LASIX) 20 MG tablet Take 20 mg by mouth daily.    Marland Kitchen gabapentin (NEURONTIN) 800 MG tablet Take 800 mg by mouth 3 (three) times daily.    . metFORMIN (GLUCOPHAGE-XR) 500 MG 24 hr tablet 1 daily with supper for diabetes    . mirtazapine (REMERON) 15 MG tablet Take 15 mg by mouth at bedtime.    . ondansetron (ZOFRAN ODT) 4 MG disintegrating tablet Take 1 tablet (4 mg total) by mouth every 8 (eight) hours as needed for nausea or vomiting. 30 tablet 2  . ondansetron (ZOFRAN) 4 MG tablet Take 1 tablet (4 mg total) by mouth every 6 (six) hours as needed for nausea or vomiting. 30 tablet 2  . Oxcarbazepine (TRILEPTAL) 300 MG tablet Take 300 mg by mouth at bedtime.    . prazosin (MINIPRESS) 1 MG capsule Take 1 mg by mouth at bedtime.    . risperiDONE (RISPERDAL) 0.25 MG tablet Take 1 tablet by mouth 2 (two) times daily.    . rosuvastatin (CRESTOR) 10 MG tablet Take 10 mg by mouth daily.    . Tiotropium Bromide Monohydrate 2.5 MCG/ACT AERS Inhale 2 puffs into the lungs daily.     No current facility-administered medications for this visit.    Allergies  Allergen Reactions  . Adalimumab Other (See Comments)    Cervical Cancer  . Baclofen Other (See Comments)  . Etanercept Other (See Comments)  . Plaquenil [Hydroxychloroquine]     alopecia  . Topiramate Other (See Comments)    Numbness and tingling Numbness and tingling Numbness and tingling   . Armodafinil  Palpitations    Review of Systems:  Constitutional: Denies fever, chills, diaphoresis, appetite change, has fatigue.  HEENT: Denies photophobia, eye pain, redness, hearing loss, ear pain, congestion, sore throat, rhinorrhea, sneezing, mouth sores, neck pain, neck stiffness and tinnitus.   Respiratory: Has some SOB, no DOE, cough, chest tightness,  and wheezing.   Cardiovascular: Denies chest pain, palpitations and leg swelling.  Genitourinary: Denies dysuria, has occ urgency, frequency. No flank pain and difficulty urinating.  Musculoskeletal: has myalgias, back pain, joint swelling, arthralgias and gait problem.  Skin: No  rash.  Neurological: Denies dizziness, seizures, syncope, weakness, light-headedness, numbness and headaches.  Hematological: Denies adenopathy. Easy bruising, personal or family bleeding history  Psychiatric/Behavioral: Has anxiety or depression     Physical Exam:    BP 132/80   Pulse 94   Ht 5\' 5"  (1.651 m)   Wt 222 lb 2 oz (100.8 kg)   SpO2 97%   BMI 36.96 kg/m  Filed Weights   05/25/21 1328  Weight: 222 lb 2 oz (100.8 kg)   Constitutional:  Well-developed, in no acute distress. Psychiatric: Normal mood and affect. Behavior is normal. HEENT: Pupils normal.  Conjunctivae are normal. No scleral icterus. Neck supple.  Cardiovascular: Normal rate, regular rhythm. No edema Pulmonary/chest: Effort normal and breath sounds normal. No wheezing, rales or rhonchi. Abdominal: Soft, nondistended.  Mild epigastric tender. Bowel sounds active throughout. There are no masses palpable. No hepatomegaly. Rectal:  defered Neurological: Alert and oriented to person place and time. Skin: Skin is warm and dry. No rashes noted. Seen in presence of Pt's husband. 25 minutes spent with the patient today. Greater than 50% was spent in counseling and coordination of care with the patient    Carmell Austria, MD 05/25/2021, 1:35 PM  Cc: Dr Bea Graff

## 2021-07-01 ENCOUNTER — Ambulatory Visit (AMBULATORY_SURGERY_CENTER): Payer: Medicare Other | Admitting: Gastroenterology

## 2021-07-01 ENCOUNTER — Encounter: Payer: Self-pay | Admitting: Gastroenterology

## 2021-07-01 ENCOUNTER — Other Ambulatory Visit: Payer: Self-pay

## 2021-07-01 VITALS — BP 139/82 | HR 65 | Temp 98.6°F | Resp 12 | Ht 65.0 in | Wt 222.0 lb

## 2021-07-01 DIAGNOSIS — K2950 Unspecified chronic gastritis without bleeding: Secondary | ICD-10-CM | POA: Diagnosis not present

## 2021-07-01 DIAGNOSIS — Z1211 Encounter for screening for malignant neoplasm of colon: Secondary | ICD-10-CM

## 2021-07-01 DIAGNOSIS — K5989 Other specified functional intestinal disorders: Secondary | ICD-10-CM

## 2021-07-01 DIAGNOSIS — K449 Diaphragmatic hernia without obstruction or gangrene: Secondary | ICD-10-CM

## 2021-07-01 DIAGNOSIS — K219 Gastro-esophageal reflux disease without esophagitis: Secondary | ICD-10-CM

## 2021-07-01 DIAGNOSIS — Z8719 Personal history of other diseases of the digestive system: Secondary | ICD-10-CM

## 2021-07-01 DIAGNOSIS — R1013 Epigastric pain: Secondary | ICD-10-CM | POA: Diagnosis not present

## 2021-07-01 DIAGNOSIS — D122 Benign neoplasm of ascending colon: Secondary | ICD-10-CM

## 2021-07-01 DIAGNOSIS — K2970 Gastritis, unspecified, without bleeding: Secondary | ICD-10-CM

## 2021-07-01 MED ORDER — SODIUM CHLORIDE 0.9 % IV SOLN: 500.0000 mL | Freq: Once | INTRAVENOUS | Status: DC

## 2021-07-01 NOTE — Progress Notes (Signed)
Called to room to assist during endoscopic procedure.  Patient ID and intended procedure confirmed with present staff. Received instructions for my participation in the procedure from the performing physician.  

## 2021-07-01 NOTE — Op Note (Addendum)
[facility] Patient Name: Lacey Erickson Procedure Date: 07/01/2021 1:22 PM MRN: [id number] Endoscopist: Jackquline Denmark , MD Age: 45 Referring MD:  Date of Birth: February 06, 1976 Gender: Female Account #: [id number] Procedure:                Colonoscopy Indications:              Screening for colorectal malignant neoplasm. H/O                            diverticulitis Medicines:                Monitored Anesthesia Care Procedure:                Pre-Anesthesia Assessment:                           - Prior to the procedure, a History and Physical                            was performed, and patient medications and                            allergies were reviewed. The patient's tolerance of                            previous anesthesia was also reviewed. The risks                            and benefits of the procedure and the sedation                            options and risks were discussed with the patient.                            All questions were answered, and informed consent                            was obtained. Prior Anticoagulants: The patient has                            taken no previous anticoagulant or antiplatelet                            agents. ASA Grade Assessment: II - A patient with                            mild systemic disease. After reviewing the risks                            and benefits, the patient was deemed in                            satisfactory condition to undergo the procedure.  After obtaining informed consent, the colonoscope                            was passed under direct vision. Throughout the                            procedure, the patient's blood pressure, pulse, and                            oxygen saturations were monitored continuously. The                            CF-HQ190L Colonoscope was introduced through the                            anus and advanced to the 2 cm into the ileum. The                             colonoscopy was performed without difficulty. The                            patient tolerated the procedure well. The quality                            of the bowel preparation was good. The terminal                            ileum, ileocecal valve, appendiceal orifice, and                            rectum were photographed. 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 Biopsies were taken with a cold forceps for                            histology using tunnel technique. Whitish fat like                            substance was seen slightly protruding out of the                            biopsy site.                           Multiple medium-mouthed diverticula were found in  the ascending colon and cecum. Rare small minimal                            early diverticula in the sigmoid colon.                           Non-bleeding internal hemorrhoids were found during                            retroflexion. The hemorrhoids were moderate.                           The terminal ileum appeared normal.                           The exam was otherwise without abnormality on                            direct and retroflexion views. Complications:            No immediate complications. Estimated Blood Loss:     Estimated blood loss: none. Impression:               - Medium-sized lipoma in the distal ascending                            colon. Biopsied.                           - Moderate right colonic diverticulosis                           - Non-bleeding internal hemorrhoids.                           - The examined portion of the ileum was  normal.                           - The examination was otherwise normal on direct                            and retroflexion views. Recommendation:           - Patient has a contact number available for                            emergencies. The signs and symptoms of potential                            delayed complications were discussed with the                            patient. Return to normal activities tomorrow.                            Written discharge instructions were provided to the  patient.                           - Resume previous diet.                           - Continue present medications.                           - Await pathology results.                           - Repeat colonoscopy for surveillance based on                            pathology results.                           - Obtain CT scan report from [facility]. I could                            not see any report in Care Everywhere. I have                            discussed with the family that she would be,                            although slight, risk for intussusception.                           - Return to GI clinic in 12 weeks. Jackquline Denmark, MD 07/01/2021 2:16:48 PM This report has been signed electronically.

## 2021-07-01 NOTE — Progress Notes (Signed)
Pt to keep partial plate in place, Dr Lyndel Safe aware and cleared to do EGD, vss

## 2021-07-01 NOTE — Progress Notes (Signed)
1310 Robinul 0.1 mg IV given due large amount of secretions upon assessment.  MD made aware, vss  

## 2021-07-01 NOTE — Patient Instructions (Signed)
Handout given:  hiatal hernia, gastritis, diverticulosis, polyps Resume previous diet Continue current medications Await pathology results Return to GI clinic in 12 weeks   YOU HAD AN ENDOSCOPIC PROCEDURE TODAY AT [facility]:   Refer to the procedure report that was given to you for any specific questions about what was found during the examination.  If the procedure report does not answer your questions, please call your gastroenterologist to clarify.  If you requested that your care partner not be given the details of your procedure findings, then the procedure report has been included in a sealed envelope for you to review at your convenience later.  YOU SHOULD EXPECT: Some feelings of bloating in the abdomen. Passage of more gas than usual.  Walking can help get rid of the air that was put into your GI tract during the procedure and reduce the bloating. If you had a lower endoscopy (such as a colonoscopy or flexible sigmoidoscopy) you may notice spotting of blood in your stool or on the toilet paper. If you underwent a bowel prep for your procedure, you may not have a normal bowel movement for a few days.  Please Note:  You might notice some irritation and congestion in your nose or some drainage.  This is from the oxygen used during your procedure.  There is no need for concern and it should clear up in a day or so.  SYMPTOMS TO REPORT IMMEDIATELY:  Following lower endoscopy (colonoscopy or flexible sigmoidoscopy):  Excessive amounts of blood in the stool  Significant tenderness or worsening of abdominal pains  Swelling of the abdomen that is new, acute  Fever of 100F or higher  Following upper endoscopy (EGD)  Vomiting of blood or coffee ground material  New chest pain or pain under the shoulder blades  Painful or persistently difficult swallowing  New shortness of breath  Fever of 100F or higher  Black, tarry-looking stools  For urgent or emergent issues, a  gastroenterologist can be reached at any hour by calling [phone]. Do not use MyChart messaging for urgent concerns.   DIET:  We do recommend a small meal at first, but then you may proceed to your regular diet.  Drink plenty of fluids but you should avoid alcoholic beverages for 24 hours.  ACTIVITY:  You should plan to take it easy for the rest of today and you should NOT DRIVE or use heavy machinery until tomorrow (because of the sedation medicines used during the test).    FOLLOW UP: Our staff will call the number listed on your records 48-72 hours following your procedure to check on you and address any questions or concerns that you may have regarding the information given to you following your procedure. If we do not reach you, we will leave a message.  We will attempt to reach you two times.  During this call, we will ask if you have developed any symptoms of COVID 19. If you develop any symptoms (ie: fever, flu-like symptoms, shortness of breath, cough etc.) before then, please call [phone].  If you test positive for Covid 19 in the 2 weeks post procedure, please call and report this information to [place].    If any biopsies were taken you will be contacted by phone or by letter within the next 1-3 weeks.  Please call us at [phone] if you have not heard about the biopsies in 3 weeks.   SIGNATURES/CONFIDENTIALITY: You and/or your care partner have signed paperwork  which will be entered into your electronic medical record.  These signatures attest to the fact that that the information above on your After Visit Summary has been reviewed and is understood.  Full responsibility of the confidentiality of this discharge information lies with you and/or your care-partner.

## 2021-07-01 NOTE — Progress Notes (Signed)
1343 Nasal tube placed, 7.0 without trauma, vss

## 2021-07-01 NOTE — Progress Notes (Signed)
1355 Albuterol neb given, O2 sat running 90 to 92%, vss

## 2021-07-01 NOTE — Progress Notes (Signed)
Chief Complaint: FU  Referring Provider:  Dr Bea Graff      ASSESSMENT AND PLAN;   #1. N/V with epi pain. S/p EGD 05/2018-small hiatal hernia, mild gastritis, retained food. H/O cholecystectomy 1999 #2. H/O diverticulitis 10/2018 [facility]) and ileus 11/2018 ([facility]) #3. GERD with small HH. #4. IBS-D, excerberated by meds like metformin.  Had recent COVID-19 as well. #5. Fatty liver.  Plan: - EGD/colon (2 day prep) - Restart Protonix 40 mg p.o. once a day #30, 11 refills   I discussed the nature of the recommended EGD/Colonoscopy , as well as the indications, risks, alternatives and potential complications including, but not limited to, bleeding, infection, reaction to medication, damage to internal organs, cardiac and/or pulmonary problems, and perforation requiring surgery (1 to 2 in 1000). The possibility that significant findings could be missed was explained. All ? were answered. The patient gives consent for the procedures.   HPI:    Lacey Erickson is a 45 y.o. female  With ILD, COPD, COVID (June 2022, req O2, not now), DM, RA, fibromyalgia, anxiety/depression For follow-up visit  C/O N/V x 6 months, more prominently ever since she got COVID in June 2022 requiring paxlovid/O2, then adm 6/14-6/17 at [facility].  Also complains of heartburn.  She stopped taking Protonix on her own as she ran out of prescription.  She was doing well with Protonix.   Diarrhea x several years, more prominently over last 1 year.  More so ever she has been on metformin.  She was scheduled to have colonoscopy previously but patient canceled.  She is willing to get it done this time.  She believes she cannot handle preparation.  No sodas, chocolates, chewing gums, artificial sweeteners and candy. No NSAIDs  She continues to smoke.     Review of previous records: -Acute diverticulitis- Adm to [facility] December 2019. After D/C, was readmitted to [place] with ileus.  She had low potassium and low  magnesium.  Managed conservatively by IV fluids, replacing electrolytes, minimizing pain medications and ambulating.  Did not get NG tube. -EGD 05/2018: small HH, retained food in the stomach without any gastric outlet obstruction.  Neg SB Bx for celiac in past.  -CT AP 10/2018 No acute processes in abdomen/pelvis Fatty liver L1 compression deformity.  Wt Readings from Last 3 Encounters:  07/01/21 222 lb (100.7 kg)  05/25/21 222 lb 2 oz (100.8 kg)  04/01/21 229 lb (103.9 kg)     Past Medical History:  Diagnosis Date  . Anxiety and depression   . Bipolar disorder ([place])   . Chronic respiratory failure ([place])   . Elevated LFTs   . Fibromyalgia   . GERD (gastroesophageal reflux disease)   . Hepatic steatosis   . Hyperlipemia   .    Marland Kitchen RHA (rheumatoid arthritis) ([place])     Past Surgical History:  Procedure Laterality Date  . APPENDECTOMY    . BACK SURGERY  07/2018  . BREAST LUMPECTOMY Left    Benign  . CHOLECYSTECTOMY    . ESOPHAGOGASTRODUODENOSCOPY  02/25/2016   Small hiatal hernia. Retained food in the stomach- no evidence of gastric outlet obstruction.   Marland Kitchen FINGER SURGERY  03/2021   right index  . NASAL SEPTUM SURGERY  2021  . TOTAL ABDOMINAL HYSTERECTOMY W/ BILATERAL SALPINGOOPHORECTOMY     Cervical Cancer    Family History  Problem Relation Age of Onset  . Thyroid disease Mother   . Hypertension Mother   . Diabetes Mother   .  Other Father        Polio, encephalitis  . Colon cancer Neg Hx   . Stomach cancer Neg Hx   . Rectal cancer Neg Hx   . Esophageal cancer Neg Hx     Social History   Tobacco Use  . Smoking status: Every Day    Packs/day: 3.00    Years: 33.00    Pack years: 99.00    Types: Cigarettes  . Smokeless tobacco: Never  . Tobacco comments:    currently smoking 2ppd as of 04/01/21 ep  Vaping Use  . Vaping Use: Never used  Substance Use Topics  . Alcohol use: Not Currently  . Drug use: Never    Current Outpatient Medications  Medication  Sig Dispense Refill  . fluvoxaMINE (LUVOX) 100 MG tablet Take 100 mg by mouth 2 (two) times daily.    Marland Kitchen gabapentin (NEURONTIN) 800 MG tablet Take 800 mg by mouth 3 (three) times daily.    . metFORMIN (GLUCOPHAGE-XR) 500 MG 24 hr tablet 1 daily with supper for diabetes    . mirtazapine (REMERON) 15 MG tablet Take 15 mg by mouth at bedtime.    . ondansetron (ZOFRAN ODT) 4 MG disintegrating tablet Take 1 tablet (4 mg total) by mouth every 8 (eight) hours as needed for nausea or vomiting. 30 tablet 2  . ondansetron (ZOFRAN) 4 MG tablet Take 1 tablet (4 mg total) by mouth every 6 (six) hours as needed for nausea or vomiting. 30 tablet 2  . Oxcarbazepine (TRILEPTAL) 300 MG tablet Take 300 mg by mouth at bedtime.    . pantoprazole (PROTONIX) 40 MG tablet Take 1 tablet (40 mg total) by mouth daily. 90 tablet 3  . prazosin (MINIPRESS) 1 MG capsule Take 1 mg by mouth at bedtime.    . risperiDONE (RISPERDAL) 0.25 MG tablet Take 1 tablet by mouth 2 (two) times daily.    . rosuvastatin (CRESTOR) 10 MG tablet Take 10 mg by mouth daily.    Marland Kitchen sulfaSALAzine (AZULFIDINE) 500 MG tablet Take 500 mg by mouth 2 (two) times daily.    Marland Kitchen albuterol (VENTOLIN HFA) 108 (90 Base) MCG/ACT inhaler Inhale into the lungs.    . budesonide-formoterol (SYMBICORT) 160-4.5 MCG/ACT inhaler Inhale 2 puffs into the lungs in the morning and at bedtime. (Patient not taking: Reported on 07/01/2021) 10.2 g 6  . buprenorphine (BUTRANS) 20 MCG/HR PTWK Place onto the skin. (Patient not taking: Reported on 07/01/2021)    . furosemide (LASIX) 20 MG tablet Take 20 mg by mouth daily. (Patient not taking: Reported on 07/01/2021)    . Tiotropium Bromide Monohydrate 2.5 MCG/ACT AERS Inhale 2 puffs into the lungs daily.     Current Facility-Administered Medications  Medication Dose Route Frequency Provider Last Rate Last Admin  . 0.9 %  sodium chloride infusion  500 mL Intravenous Once Jackquline Denmark, MD        Allergies  Allergen Reactions  .  Adalimumab Other (See Comments)    Cervical Cancer  . Baclofen Other (See Comments)  . Etanercept Other (See Comments)  . Plaquenil [Hydroxychloroquine]     alopecia  . Topiramate Other (See Comments)    Numbness and tingling Numbness and tingling Numbness and tingling   . Armodafinil Palpitations    Review of Systems:  Constitutional: Denies fever, chills, diaphoresis, appetite change, has fatigue.  HEENT: Denies photophobia, eye pain, redness, hearing loss, ear pain, congestion, sore throat, rhinorrhea, sneezing, mouth sores, neck pain, neck stiffness and tinnitus.  Respiratory: Has some SOB, no DOE, cough, chest tightness,  and wheezing.   Cardiovascular: Denies chest pain, palpitations and leg swelling.  Genitourinary: Denies dysuria, has occ urgency, frequency. No flank pain and difficulty urinating.  Musculoskeletal: has myalgias, back pain, joint swelling, arthralgias and gait problem.  Skin: No rash.  Neurological: Denies dizziness, seizures, syncope, weakness, light-headedness, numbness and headaches.  Hematological: Denies adenopathy. Easy bruising, personal or family bleeding history  Psychiatric/Behavioral: Has anxiety or depression     Physical Exam:    BP (!) 163/98   Pulse 60   Temp 98.6 F (37 C) (Skin)   Resp 11   Ht '5\' 5"'$  (1.651 m)   Wt 222 lb (100.7 kg)   SpO2 100%   BMI 36.94 kg/m  Filed Weights   07/01/21 1235  Weight: 222 lb (100.7 kg)   Constitutional:  Well-developed, in no acute distress. Psychiatric: Normal mood and affect. Behavior is normal. HEENT: Pupils normal.  Conjunctivae are normal. No scleral icterus. Neck supple.  Cardiovascular: Normal rate, regular rhythm. No edema Pulmonary/chest: Effort normal and breath sounds normal. No wheezing, rales or rhonchi. Abdominal: Soft, nondistended.  Mild epigastric tender. Bowel sounds active throughout. There are no masses palpable. No hepatomegaly. Rectal:  defered Neurological: Alert and  oriented to person place and time. Skin: Skin is warm and dry. No rashes noted. Seen in presence of Pt's husband. 25 minutes spent with the patient today. Greater than 50% was spent in counseling and coordination of care with the patient    Carmell Austria, MD 07/01/2021, 1:35 PM  Cc: Dr Bea Graff

## 2021-07-01 NOTE — Op Note (Signed)
[place] Patient Name: Lacey Erickson Procedure Date: 07/01/2021 1:23 PM MRN: [id number] Endoscopist: Jackquline Denmark , MD Age: 45 Referring MD:  Date of Birth: [birth year] Gender: Female Account #: [id number] Procedure:                Upper GI endoscopy Indications:              Epigastric pain Medicines:                Monitored Anesthesia Care Procedure:                Pre-Anesthesia Assessment:                           - Prior to the procedure, a History and Physical                            was performed, and patient medications and                            allergies were reviewed. The patient's tolerance of                            previous anesthesia was also reviewed. The risks                            and benefits of the procedure and the sedation                            options and risks were discussed with the patient.                            All questions were answered, and informed consent                            was obtained. Prior Anticoagulants: The patient has                            taken no previous anticoagulant or antiplatelet                            agents. ASA Grade Assessment: II - A patient with                            mild systemic disease. After reviewing the risks                            and benefits, the patient was deemed in                            satisfactory condition to undergo the procedure.                           After obtaining informed consent, the endoscope was  passed under direct vision. Throughout the                            procedure, the patient's blood pressure, pulse, and                            oxygen saturations were monitored continuously. The                            GIF [id number] was introduced through the                            mouth, and advanced to the second part of duodenum.                            The upper GI endoscopy was accomplished  without                            difficulty. The patient tolerated the procedure                            well. Scope In: Scope Out: Findings:                 The examined esophagus was normal with well-defined                            Z-line.                           A small hiatal hernia was present. Best visualized                            on the full distention of the lower esophagus.                           Localized minimal inflammation characterized by                            erythema was found in the gastric body. Biopsies                            were taken with a cold forceps for histology.                           The examined duodenum was normal. Complications:            No immediate complications. Estimated Blood Loss:     Estimated blood loss: none. Impression:               - Small transient hiatal hernia.                           - Gastritis. Biopsied. Recommendation:           - Patient has a contact number available for  emergencies. The signs and symptoms of potential                            delayed complications were discussed with the                            patient. Return to normal activities tomorrow.                            Written discharge instructions were provided to the                            patient.                           - Resume previous diet.                           - Continue present medications.                           - Await pathology results.                           - The findings and recommendations were discussed                            with the patient's family. Jackquline Denmark, MD 07/01/2021 2:10:45 PM This report has been signed electronically.

## 2021-07-01 NOTE — Progress Notes (Signed)
BP 191/137, Labetalol given IV, MD update, vss

## 2021-07-01 NOTE — Progress Notes (Signed)
Pt's states no medical or surgical changes since previsit or office visit. VS assessed by N.C ?

## 2021-07-01 NOTE — Progress Notes (Signed)
Report given to PACU, vss 

## 2021-07-05 ENCOUNTER — Telehealth: Payer: Self-pay

## 2021-07-05 ENCOUNTER — Telehealth: Payer: Self-pay | Admitting: *Deleted

## 2021-07-05 NOTE — Telephone Encounter (Signed)
First attempt follow up call to pt, no answer. 

## 2021-07-05 NOTE — Telephone Encounter (Signed)
  Follow up Call-  Call back number 07/01/2021  Post procedure Call Back phone  # [phone]  Permission to leave phone message Yes  Some recent data might be hidden     Patient questions:  Do you have a fever, pain , or abdominal swelling? No. Pain Score  0 *  Have you tolerated food without any problems? Yes.    Have you been able to return to your normal activities? Yes.    Do you have any questions about your discharge instructions: Diet   No. Medications  No. Follow up visit  No.  Do you have questions or concerns about your Care? No.  Actions: * If pain score is 4 or above: No action needed, pain <4.  Have you developed a fever since your procedure? no  2.   Have you had an respiratory symptoms (SOB or cough) since your procedure? no  3.   Have you tested positive for COVID 19 since your procedure no  4.   Have you had any family members/close contacts diagnosed with the COVID 19 since your procedure?  no   If yes to any of these questions please route to Joylene John, RN and Joella Prince, RN

## 2021-07-14 ENCOUNTER — Encounter: Payer: Self-pay | Admitting: Gastroenterology

## 2021-07-14 NOTE — Progress Notes (Signed)
Hi Gabe, Can you pl work this nice pt for colon with EMR for 15 mm lesion in distal ascending colon/hepatic flexure which on colonoscopy I thought was a lipoma.  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  Not urgent.  I believe within the next 6 months would be okay.  Unless you feel otherwise.  Thanks as always for your help.  RG

## 2021-07-18 NOTE — Progress Notes (Signed)
Proceed with CT Abdo/pelvis with p.o. and IV contrast. 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 Rush Landmark. See Dr. Donneta Romberg previous note. RG

## 2021-08-02 ENCOUNTER — Telehealth: Payer: Self-pay

## 2021-08-02 ENCOUNTER — Other Ambulatory Visit: Payer: Self-pay

## 2021-08-02 MED ORDER — HYOSCYAMINE SULFATE 0.125 MG SL SUBL: 0.1250 mg | SUBLINGUAL_TABLET | SUBLINGUAL | 0 refills | Status: DC | PRN

## 2021-08-02 NOTE — Telephone Encounter (Signed)
Chart reviewed.  Complex patient.  Multiple functional diagnoses.  Recent colonoscopy and upper endoscopy unrevealing.  Recommend: 1.  Citrucel 2 tablespoons daily to see if this improves her bowel consistency 2.  Levsin sublingual 0.125 mg; #60; 1 or 2 sublingual every 4 hours as needed for pain; no refills 3.  It looks like Dr. Lyndel Safe returns tomorrow.  Further recommendations per Dr. Lyndel Safe upon his return.

## 2021-08-02 NOTE — Telephone Encounter (Signed)
Dr. Lyndel Erickson Pt. Pt states that she is having Nausea, Diarrhea, and abdominal pain that is constant in lower abdomen. Pt states that these symptoms have increased after her EGD/ Colon that she had 07/01/21. Pt states that the only thing that she can keep down is rice. Pt sates that she is getting little sleep due to the pain.  Please advise as DOD

## 2021-08-02 NOTE — Telephone Encounter (Signed)
Pt was given Dr.Perry recommendations. Prescription sent to pharmacy. Pt verbalized understanding with all questions answered.

## 2021-08-04 NOTE — Telephone Encounter (Signed)
John, Thanks a lot RG

## 2021-08-10 ENCOUNTER — Telehealth: Payer: Self-pay | Admitting: Internal Medicine

## 2021-08-11 NOTE — Telephone Encounter (Signed)
Patient CT of chest showing concerns for interstitial lung disease.  Recommending a repeat high-resolution CT chest in 12 months to further assess and monitor.  Please go ahead and order a repeat high-resolution CT chest to be completed in July/2023.   Please also ensure patient has upcoming follow-up with Dr. Chase Caller in next 6 to 8 weeks in 30-minute interstitial lung disease slot.   Wyn Quaker, FNP  Pt notified of results/recs per Aaron Edelman and appt with MR was made  Nothing further needed

## 2021-08-11 NOTE — Telephone Encounter (Signed)
Patient is returning phone call. Patient phone number is [phone].

## 2021-08-24 ENCOUNTER — Other Ambulatory Visit: Payer: Self-pay | Admitting: Internal Medicine

## 2021-08-25 NOTE — Telephone Encounter (Signed)
-  Can you please find out when this patient is scheduled with Dr. Rush Landmark for EMR? (See my telephone note) -Lets recheck CBC, CMP, lipase. -She may need follow-up visit in app clinic or in my clinic if still with problems RG

## 2021-08-26 ENCOUNTER — Other Ambulatory Visit: Payer: Self-pay

## 2021-08-26 ENCOUNTER — Other Ambulatory Visit (INDEPENDENT_AMBULATORY_CARE_PROVIDER_SITE_OTHER): Payer: Medicare Other

## 2021-08-26 DIAGNOSIS — R112 Nausea with vomiting, unspecified: Secondary | ICD-10-CM | POA: Diagnosis not present

## 2021-08-26 DIAGNOSIS — R1013 Epigastric pain: Secondary | ICD-10-CM

## 2021-08-26 LAB — CBC WITH DIFFERENTIAL/PLATELET
Basophils Absolute: 0.1 10*3/uL (ref 0.0–0.1)
Basophils Relative: 0.6 % (ref 0.0–3.0)
Eosinophils Absolute: 0.4 10*3/uL (ref 0.0–0.7)
Eosinophils Relative: 3.6 % (ref 0.0–5.0)
HCT: 42.1 % (ref 36.0–46.0)
Hemoglobin: 13.5 g/dL (ref 12.0–15.0)
Lymphocytes Relative: 22.3 % (ref 12.0–46.0)
Lymphs Abs: 2.3 10*3/uL (ref 0.7–4.0)
MCHC: 31.9 g/dL (ref 30.0–36.0)
MCV: 84.3 fl (ref 78.0–100.0)
Monocytes Absolute: 0.7 10*3/uL (ref 0.1–1.0)
Monocytes Relative: 7 % (ref 3.0–12.0)
Neutro Abs: 6.8 10*3/uL (ref 1.4–7.7)
Neutrophils Relative %: 66.5 % (ref 43.0–77.0)
Platelets: 215 10*3/uL (ref 150.0–400.0)
RBC: 5 Mil/uL (ref 3.87–5.11)
RDW: 17 % — ABNORMAL HIGH (ref 11.5–15.5)
WBC: 10.2 10*3/uL (ref 4.0–10.5)

## 2021-08-26 LAB — COMPREHENSIVE METABOLIC PANEL
ALT: 11 U/L (ref 0–35)
AST: 11 U/L (ref 0–37)
Albumin: 3.9 g/dL (ref 3.5–5.2)
Alkaline Phosphatase: 132 U/L — ABNORMAL HIGH (ref 39–117)
BUN: 15 mg/dL (ref 6–23)
CO2: 28 mEq/L (ref 19–32)
Calcium: 8.9 mg/dL (ref 8.4–10.5)
Chloride: 103 mEq/L (ref 96–112)
Creatinine, Ser: 1.35 mg/dL — ABNORMAL HIGH (ref 0.40–1.20)
GFR: 47.26 mL/min — ABNORMAL LOW (ref >60.00)
Glucose, Bld: 151 mg/dL — ABNORMAL HIGH (ref 70–99)
Potassium: 4 mEq/L (ref 3.5–5.1)
Sodium: 139 mEq/L (ref 135–145)
Total Bilirubin: 0.3 mg/dL (ref 0.2–1.2)
Total Protein: 6.7 g/dL (ref 6.0–8.3)

## 2021-08-26 LAB — LIPASE: Lipase: 25 U/L (ref 11.0–59.0)

## 2021-08-26 NOTE — Telephone Encounter (Signed)
Pt stated that's she is still having N/V; Diarrhea; abdominal pain; hard to keep food down.  Labs entered into Epic, Pt made aware.  Follow up appointment made with [facility]. For 09/01/2021 @ 1:30. Pt made aware. Spoke with Dr. Rush Landmark nurse for EMR and was informed that she is working on it and will schedule soon and contact pt.

## 2021-09-01 ENCOUNTER — Ambulatory Visit: Payer: Medicare Other | Admitting: Physician Assistant

## 2021-09-02 ENCOUNTER — Telehealth: Payer: Self-pay | Admitting: Internal Medicine

## 2021-09-02 ENCOUNTER — Encounter: Payer: Self-pay | Admitting: Internal Medicine

## 2021-09-02 ENCOUNTER — Other Ambulatory Visit: Payer: Self-pay

## 2021-09-02 ENCOUNTER — Ambulatory Visit (INDEPENDENT_AMBULATORY_CARE_PROVIDER_SITE_OTHER): Payer: Medicare Other | Admitting: Internal Medicine

## 2021-09-02 VITALS — BP 126/70 | HR 74 | Temp 98.0°F | Ht 65.0 in | Wt 232.2 lb

## 2021-09-02 DIAGNOSIS — Z7729 Contact with and (suspected ) exposure to other hazardous substances: Secondary | ICD-10-CM

## 2021-09-02 DIAGNOSIS — J439 Emphysema, unspecified: Secondary | ICD-10-CM

## 2021-09-02 DIAGNOSIS — Z87891 Personal history of nicotine dependence: Secondary | ICD-10-CM | POA: Diagnosis not present

## 2021-09-02 DIAGNOSIS — R0602 Shortness of breath: Secondary | ICD-10-CM

## 2021-09-02 DIAGNOSIS — J849 Interstitial pulmonary disease, unspecified: Secondary | ICD-10-CM

## 2021-09-02 DIAGNOSIS — Z8739 Personal history of other diseases of the musculoskeletal system and connective tissue: Secondary | ICD-10-CM

## 2021-09-02 DIAGNOSIS — R918 Other nonspecific abnormal finding of lung field: Secondary | ICD-10-CM

## 2021-09-02 DIAGNOSIS — K219 Gastro-esophageal reflux disease without esophagitis: Secondary | ICD-10-CM

## 2021-09-02 DIAGNOSIS — I288 Other diseases of pulmonary vessels: Secondary | ICD-10-CM

## 2021-09-02 LAB — PROTIME-INR
INR: 1 ratio (ref 0.8–1.0)
Prothrombin Time: 11 s (ref 9.6–13.1)

## 2021-09-02 LAB — APTT: aPTT: 33.8 s — ABNORMAL HIGH (ref 23.4–32.7)

## 2021-09-02 LAB — BRAIN NATRIURETIC PEPTIDE: Pro B Natriuretic peptide (BNP): 146 pg/mL — ABNORMAL HIGH (ref 0.0–100.0)

## 2021-09-02 NOTE — Telephone Encounter (Signed)
Order for ONO was placed 04/01/21. Pt came in for an appt today, 10/27 with Dr. Chase Caller and pt stated that she still has not heard anything about having ONO done.  PCCs, please advise.

## 2021-09-02 NOTE — Patient Instructions (Addendum)
    ICD-10-CM   1. ILD (interstitial lung disease) ([place])  J84.9     2. History of rheumatoid arthritis  Z87.39     3. Gastroesophageal reflux disease, unspecified whether esophagitis present  K21.9     4. History of smoking greater than 50 pack years  Z87.891     5. Long-term exposure involving bird droppings  Z77.29     6. 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 Multiple pulmonary nodules determined by computed tomography of lung  R91.8       Glad cough is some better after stopping lisinopril   - will list it as allergy  The condition you have is called interstitial lung disease  In your particular case it could be related to rheumatoid arthritis, smoking, or bird related condition called chronic HP or acid reflux of another condition called UIP  You  need a biopsy to sort out why you have this condition  Plan - make sure heart ok before bioopsy  - do BNP , pt, ptt blood test 09/02/2021  - do echo next severa days to few weeks - refer DR Roxan Hockey for evaluation of surgical lung biopsy and bronchoscopy with lavage   - we dicussed option of just doing bronch first but felt surgical biopsy was definitive and you preferrred thsi approach  - try to quit smoking  - decision on bird after biopsy  Follow-up - early Dec 2022 - 30 min slot   - by this time you should have had biopsy  - start spiriva/check alpha 1 next visit

## 2021-09-02 NOTE — Progress Notes (Signed)
OV 08/13/2020  Subjective:  Patient ID: Lacey Erickson, female , DOB: 04/17/1976 , age 45 y.o. , MRN: [id number] , ADDRESS: [address] [place] [address] PCP Raina Mina., MD Relevant Other Providers: - x This Provider for this visit: Treatment Team:  Attending Provider: Brand Males, MD    08/13/2020 -   Chief Complaint  Patient presents with   Consult    Pt being referred due to having fluid on lungs via cxr. Pt has complaints of SOB, cough, wheezing, and chest tightness.     HPI Lacey Erickson 45 y.o. -presents with her husband.  Referred by primary care specialist.  She lives near [place], [place].  She tells me that at baseline she is disabled because of bipolar and fibromyalgia and osteoarthritis and prediabetes.  She also carries a diagnosis of rheumatoid arthritis not otherwise specified made many years ago.  But she is not on any immunomodulators.  She also suffers from obesity.  She is a chronic smoker.  Then approximately 6 months ago developed insidious onset of shortness of breath, cough and wheezing.  All the symptoms of present and made worse by exertion and relieved by rest.  The wheezing and cough also wake her up in the middle of the night.  She can just walk across to the bathroom and get worse with all the symptoms coming on together.  In addition she is also reporting a chest pain that feels like a "elephant in the chest" but she denies any previous heart attack.  She says approximately 3 to 4 months ago she was admitted to [facility] because chest x-ray showed fluid in her chest and lungs not otherwise specified.  She says " nothing much" was done at the hospital and then she was discharged.  However symptoms have persisted and is getting worse.  Symptoms are rated as moderate to severe overall.  She has tried Spiriva for her symptoms it does not help.  She has been on multiple prednisone courses and this is helped.  Not been on any inhaled  steroids.  She denies any mold or mildew exposure.  She denies any vaping or electronic cigarette use or cocaine use.  Only substance of abuse is tobacco.  She denies any occupational exposure to mold.  FENO is 5 and  Heavy smoking 2.5 pack x 33 years  Coronary artery disease risk factors include smoking, obesity, prediabetes and hyperlipidemia  Results for ARVIE, VILLARRUEL (MRN [id number]) as of 08/13/2020 13:57  Ref. Range 05/22/2018 14:45 11/22/2018 12:14 12/11/2018 09:06  Creatinine Latest Ref Range: 0.40 - 1.20 mg/dL 1.12 0.90 1.19  Results for AMESHA, BAILEY (MRN [id number]) as of 08/13/2020 13:57  Ref. Range 12/11/2018 09:06  Hemoglobin Latest Ref Range: 12.0 - 15.0 g/dL 13.6   Results for EMPRESS, NEWMANN (MRN [id number]) as of 08/13/2020 13:57  Ref. Range 12/11/2018 09:06  Eosinophils Absolute Latest Ref Range: 0 - 0 K/uL 0.4     09/03/2020  - Visit   45 year old female current everyday smoker followed in our office for dyspnea on exertion interstitial lung disease.  She initially was consulted with Dr. Chase Caller on 08/13/2020.  Patient had lab work that showed positive Rast allergy panel to ragweed.  Mildly elevated IgE of 111 and mildly elevated CBC with differential with eosinophil count of 300.  Patient felt significantly better when on steroids.  She did not tolerate starting Symbicort.  She felt jittery.  She has not tolerated Spiriva HandiHaler.  She  does not like DPI's.  She has been managed by many other specialist in the past.  Last pulmonary specialist was Dr. Carren Rang in [place].  She reports that she had a breathing test and a sleep study done there.  She is unsure the results.  She reports that she did have obstructive sleep apnea and was started on CPAP therapy but she could not tolerate.  She also has a history of rheumatoid arthritis.  Last managed by rheumatology at [facility] Dr. Eliberto Ivory in 2018.  She is currently not seeing any providers for management of her RA.  Last  assessment and plan from February/2018 office visit with Dr. Eliberto Ivory is listed below:  Feb/ 2018 ASSESSMENT: 45 year old female with rheumatoid arthritis on Arava 10 mg daily. She is improved but still has active arthritis on examination. I think it is reasonable to increase her Arava to 20 mg daily to see if this will better control her arthritis. We can do a short course of prednisone for symptomatic relief given her tender and swollen joints on exam today. Not sure that [place] alone will be sufficient but she would like to stay off of TNF at this time. Will check toxicity labs today especially given her history of elevated LFTs. Of note, she has noted some mild alopecia and suspect that this is from the leflunomide. Typically the side effect is transient and so will continue the medication at this time. She had a bone density done October 2017 that showed osteopenia. Her flax was low. Encouraged calcium and vitamin D as well as weightbearing exercise. We will try to minimize prednisone.   PLAN: 1. Increase Arava to 20 mg daily. Toxicity labs today and every 3 months. 2. Short course of prednisone for symptomatic relief. 3. Bone density done 08/2016 shows osteopenia continue calcium and vitamin D. 4. Return to clinic in 4 months.   Patient also reporting that last year she had nasal polyps removed by an ENT/surgeon in [place].  We do not have these records.  We will try to request these today.  Patient reports that she has persistent rhinitis symptoms.  She does take Claritin daily.  Sometimes she does take Benadryl.  She does not use any nasal meds.  She continues to have significant dyspnea on exertion as well as lower extremity swelling.  She reports her primary care recently refilled her diuretics.  But at a lower dose.  She is unsure why.  She is currently changing primary care providers and is planning on establishing with Dr. Salvadore Oxford.  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  Recommending repeat high-resolution CT chest in 1 year.  Patient would like flu vaccine if available today  Questionaires / Pulmonary Flowsheets:   ACT:  No flowsheet data found.  MMRC: mMRC Dyspnea Scale mMRC Score  09/03/2020 3    Epworth:  No flowsheet data found.    08/28/2020-high-resolution CT chest-appearance of lungs may suggest interstitial lung disease with spectrum of findings considered most compatible with an alternative diagnosis to UIP, primary differential consideration is that of nonspecific interstitial pneumonia, repeat high-resolution CT chest in 12 months to assess for changes, 4 mm nodule in the left upper lobe, aortic arthrosclerosis  FENO:  Lab Results  Component Value Date   NITRICOXIDE 5 08/13/2020      No results found.   xxxxxxxxxxxxxxxx[id number]  OV 04/01/2021  Subjective:  Patient ID: Lacey Erickson, female , DOB: January 11, 1976 , age 72 y.o. , MRN: [id number] ,  ADDRESS: [address] [place] [address] PCP Maris Berger, MD Patient Care Team: Maris Berger, MD as PCP - General [facility])  This Provider for this visit: Treatment Team:  Attending Provider: Brand Males, MD    04/01/2021 -   Chief Complaint  Patient presents with   Follow-up    PFT performed today.  Pt has not been feeling good since last visit. States she has had a cough since last visit and will occ cough up phlegm. Pt also has been hoarse and has had some wheezing too.     HPI Lacey Erickson 45 y.o. -returns for follow-up.  Last time I saw her and after that she saw a nurse practitioner.  She ended up not having asthma but has ILD.  Multiple action items were given to her including seeing rheumatologist but this has not been done yet.  She is asked to quit smoking but she has not quit yet.  She did have a high-resolution CT chest but she thinks it was at [facility] or in [place].  We  only have the documentation from the nurse practitioner.  I do not have the image from that.  It is alternative diagnosis.  She had autoimmune and vasculitis profile.  This is all negative including rheumatoid factor.  She is currently here with her husband.  In summary ILD work-up is in progress.  Therefore asked her to fill out the ILD questionnaire. PFT shows estrcitn   [facility] Integrated Comprehensive ILD Questionnaire  Symptoms:   SYMPTOM SCALE - ILD 04/01/2021   O2 use *ra  Shortness of Breath 0 -> 5 scale with 5 being worst (score 6 If unable to do)  At rest 0  Simple tasks - showers, clothes change, eating, shaving 5  Household (dishes, doing bed, laundry) 6  Shopping 3  Walking level at own pace 5  Walking up Stairs 6  Total (30-36) Dyspnea Score 25 ,x 6 eyear and getting worse.   How bad is your cough?  5 - Since 2013, Worse over time  Worse lying down, clears throat,   How bad is your fatigue 5  How bad is nausea 5  How bad is vomiting?  3  How bad is diarrhea? 3  How bad is anxiety? 5  How bad is depression 5        Past Medical History :  -Positive for asthma and COPD for the last several years.  Rheumatoid arthritis for the last several years [rheumatoid factor and CCP negative here].  Positive hiatal hernia/acid reflux for the last several years.  Positive for diabetes otherwise negative.  She has had the COVID-vaccine but never had COVID not hospitalized for COVID   ROS: Positive for arthralgia, dysphagia, fatigue and dry eyes for the last several years to few months.  Also nausea and vomiting for the last several years.  Has heartburn for the last several years.  Has snoring for the last several years.   FAMILY HISTORY of LUNG DISEASE: Denies   EXPOSURE HISTORY: Smokes cigarettes since 1988.  1 pack a day.  Actually more.  She is lived in the same house.  There is a passive smoking.  No pipes no cigars.  Does not use marijuana in the form of vaping but has  smoked regular marijuana in the past.  She does not use cocaine or intravenous drugs.   HOME and HOBBY DETAILS : [place]  She lives in  a 46 year old home for the last 16 years.  Its a trailer.  She does have a African gray bird x2 tx 2 yers kept outside her bedroom. Husband cleans this. Walks by this area regularly here is no dampness.  No mildew or mold.  No humidifier use no CPAP use no nebulizer use no steam iron use.  No Jacuzzi use.  No misting Fountain.  No pet gerbils no feather pillows.  No mold in the [facility] duct.  No music habits no gardening habits.  No hot tub or Jacuzzi or sauna.  No straw mat.   OCCUPATIONAL HISTORY (122 questions) : Extensive review of her organic antigen history at the occupational workplace is negative.  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   PULMONARY TOXICITY HISTORY (27 items): Did take methotrexate in 2013 and prednisone in 2020.  Is currently on lisinopril.  Her husband is also on this.  They both have cough.    Results for CHERE, BABSON (MRN [id number]) as of 04/01/2021 14:02  Ref. Range 09/03/2020 15:25  Anti Nuclear Antibody (ANA) Latest Ref Range: NEGATIVE  NEGATIVE  Cyclic Citrullin Peptide Ab Latest Units: UNITS <16  RA Latex Turbid. Latest Ref Range: <14 IU/mL <14  ANA,IFA RA DIAG PNL W/RFLX TIT/PATN Unknown Rpt  Anti-Jo-1 Ab (RDL) Latest Ref Range: <20 Units <20  Anti-PL-7 Ab (RDL) Latest Ref Range: Negative  Negative  Anti-PL-12 Ab (RDL) Latest Ref Range: Negative  Negative  Anti-EJ Ab (RDL) Latest Ref Range: Negative  Negative  Anti-OJ Ab (RDL) Latest Ref Range: Negative  Negative  Anti-SRP Ab (RDL) Latest Ref Range: Negative  Negative  Anti-Mi-2 Ab (RDL) Latest Ref Range: Negative  Negative  Anti-TIF-1gamma Ab (RDL) Latest Ref Range: <20 Units <20  Anti-MDA-5 Ab (CADM-140)(RDL) Latest Ref Range: <20 Units <20  Anti-NXP-2 (P140) Ab (RDL)  Latest Ref Range: <20 Units <20  Anti-SAE1 Ab, IgG (RDL) Latest Ref Range: <20 Units <20  Anti-PM/Scl-100 Ab (RDL) Latest Ref Range: <20 Units <20  Anti-Ku Ab (RDL) Latest Ref Range: Negative  Negative  Anti-SS-A 52kD Ab, IgG (RDL) Latest Ref Range: <20 Units <20  Anti-U1 RNP Ab (RDL) Latest Ref Range: <20 Units <20  Anti-U2 RNP Ab (RDL) Latest Ref Range: Negative  Negative  Anti-U3 RNP (Fibrillarin)(RDL) Latest Ref Range: Negative  Negative  SSA (Ro) (ENA) Antibody, IgG Latest Ref Range: <1.0 NEG AI <1.0 NEG  SSB (La) (ENA) Antibody, IgG Latest Ref Range: <1.0 NEG AI <1.0 NEG  Scleroderma (Scl-70) (ENA) Antibody, IgG Latest Ref Range: <1.0 NEG AI <1.0 NEG  Results for TONDALAYA, PERREN (MRN [id number]) as of 04/01/2021 14:02  Ref. Range 08/13/2020 14:48  Sheep Sorrel IgE Latest Units: kU/L <0.10  Pecan/Hickory Tree IgE Latest Units: kU/L <0.10  IgE (Immunoglobulin E), Serum Latest Ref Range: <OR=114 kU/L 110  Allergen, D pternoyssinus,d7 Latest Units: kU/L <0.10  Cat Dander Latest Units: kU/L <0.10  Dog Dander Latest Units: kU/L <0.10  [place] Grass Latest Units: kU/L <0.10  Johnson Grass Latest Units: kU/L <0.10  Timothy Grass Latest Units: kU/L <0.10  Cockroach Latest Units: kU/L <0.10  Aspergillus fumigatus, m3 Latest Units: kU/L <0.10  Allergen, Comm Silver Wendee Copp, t9 Latest Units: kU/L <0.10  Allergen, Cottonwood, t14 Latest Units: kU/L <0.10  Elm IgE Latest Units: kU/L <0.10  Allergen, Mulberry, t76 Latest Units: kU/L <0.10  Allergen, Oak,t7 Latest Units: kU/L <0.10  COMMON RAGWEED (SHORT) (W1) IGE Latest Units: kU/L 0.34 (H)  Allergen, Mouse  Urine Protein, e78 Latest Units: kU/L <0.10  D. farinae Latest Units: kU/L <0.10  Allergen, Cedar tree, t12 Latest Units: kU/L <0.10  Box Elder IgE Latest Units: kU/L <0.10  Rough Pigweed  IgE Latest Units: kU/L <0.10  A-1 Antitrypsin, Ser Latest Ref Range: 83 - 199 mg/dL 217 (H)    OV 09/02/2021  Subjective:  Patient ID: Lacey Erickson, female , DOB: 1976/05/22 , age 43 y.o. , MRN: [id number] , ADDRESS: [place] [address] PCP Maris Berger, MD Patient Care Team: Maris Berger, MD as PCP - General (Family Medicine)  This Provider for this visit: Treatment Team:  Attending Provider: Brand Males, MD    09/02/2021 -   Chief Complaint  Patient presents with   Follow-up    Pt is here to discuss results of recent CT. Pt states she still has SOB with exertion.   Interstitial lung disease in the setting of heavy smoking, African bird exposure, acid reflux and seronegative rheumatoid arthritis  Morbid obesity BMI 40.68.  Heavy smoking  HPI Lacey Erickson 45 y.o. -returns for follow-up to discuss results of her work-up.  Review of the chart indicates on 06/14/2021 she saw Tana Coast at [facility] rheumatology physician assistant.  He started on sulfasalazine 1 tablet 5 mg twice daily per chart review.  Is diagnosed with rheumatoid arthritis involving both ankles with positive rheumatoid factor.  He noted that patient did see Dr. Eliberto Ivory at [place] several years ago.  At that point in time when he had some seen her in August 2022 she was off disease modifying agents.  He decided to treat with sulfasalazine.  For further work-up she did have a high-resolution CT scan of the chest -I personally visualized this and agree that there is alternate pattern with more like groundglass opacities in the lung bases.  It fits with a DIP pattern.  She did give me history that she smokes heavily.  She is try to cut down.  The room is smelling of tobacco nevertheless.  She smokes a pack and a half a day.  She also tells me that she has African gray birds for the last 2 years.  There are 2 of them there is a right outside the bedroom she has to walk past these on a daily basis although her husband is 1 that cleans the cage.  She was on lisinopril but she stopped this.  There is a slight limp with  the cough.  She is acid reflux history and rheumatoid arthritis.  Other incidental findings in the CT scan -Mild enlargement of the pulmonary artery - Right upper lobe micronodules multiple. -No coronary artery calcification but she has atherosclerosis. -Mild emphysema  She continues to have significant symptomatology.   SYMPTOM SCALE - ILD 04/01/2021  09/02/2021 Stopped lisinpriol ? On simponi  O2 use *ra Ra   Shortness of Breath 0 -> 5 scale with 5 being worst (score 6 If unable to do)   At rest 0 3  Simple tasks - showers, clothes change, eating, shaving 5 3  Household (dishes, doing bed, laundry) 6 3  Shopping 3 5  Walking level at own pace 5 3  Walking up Stairs 6 5  Total (30-36) Dyspnea Score 25 ,x 6 eyear and getting worse.  21  How bad is your cough?  5 - Since 2013, Worse over time  Worse lying down, clears throat,  4  How bad is your fatigue 5  5  How bad is nausea 5 5  How bad is vomiting?  3 5  How bad is diarrhea? 3 5  How bad is anxiety? 5 5  How bad is depression 5 5      Simple office walk 185 feet x  3 laps goal with forehead probe 04/01/2021  09/02/2021   O2 used ra ra  Number laps completed 3 Stopped after 1 of 3 laps  Comments about pace nmdoe avg  Resting Pulse Ox/HR 97% and 78/min 97% and 74  Final Pulse Ox/HR 90% and 112/min 95% nd 92  Desaturated </= 88% no no  Desaturated <= 3% points yes no  Got Tachycardic >/= 90/min yes yes  Symptoms at end of test x Moderate - severe dyspnea  Miscellaneous comments 1 stop Stopped at 1 lap     CT Chest data - HRCT 05/11/21  Narrative & Impression  CLINICAL DATA:  45 year old female with history of shortness of breath. On oxygen at night.   EXAM: CT CHEST WITHOUT CONTRAST   TECHNIQUE: Multidetector CT imaging of the chest was performed following the standard protocol without intravenous contrast. High resolution imaging of the lungs, as well as inspiratory and expiratory imaging, was performed.    COMPARISON:  Chest CT 08/27/2020.   FINDINGS: Cardiovascular: Heart size is normal. There is no significant pericardial fluid, thickening or pericardial calcification. Aortic atherosclerosis. No definite coronary artery calcifications. 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 Pulmonic trunk is mildly dilated measuring 3.5 cm in diameter.   Mediastinum/Nodes: No pathologically enlarged mediastinal or hilar lymph nodes. Please note that accurate exclusion of hilar adenopathy is limited on noncontrast CT scans. Esophagus is unremarkable in appearance. No axillary lymphadenopathy.   Lungs/Pleura: High-resolution images demonstrate generalized ground-glass attenuation in some very mild interlobular septal thickening most evident throughout the mid to lower lungs bilaterally. No subpleural reticulation, traction bronchiectasis or honeycombing. Inspiratory and expiratory imaging demonstrates some mild air trapping indicative of mild small airways disease. There also some more focal areas of ground-glass attenuation which are somewhat nodular in appearance, most notably in the periphery of the right lower lobe demonstrated on axial image 75 of series 9 where the largest of these regions measures up to 1.1 x 1.0 cm. No acute consolidative airspace disease. No pleural effusions. Diffuse bronchial wall thickening with moderate centrilobular and paraseptal emphysema. Small pulmonary nodules are noted measuring up to 5 mm in the right upper lobe (axial image 40 of series 9).   Upper Abdomen: Diffuse low attenuation throughout the visualized hepatic parenchyma, indicative of a background of hepatic steatosis.   Musculoskeletal: Chronic appearing compression fracture of L1 with 25% loss of anterior vertebral body height and post vertebroplasty changes. There are no aggressive appearing lytic or blastic  lesions noted in the visualized portions of the skeleton.   IMPRESSION: 1. There are findings in the lungs concerning for interstitial lung disease, with a spectrum considered most compatible with an alternative diagnosis (not usual interstitial pneumonia). Specifically, findings are concerning for potential disclaimer of interstitial pneumonia (DIP). Repeat high-resolution chest CT is recommended in 12 months to assess for temporal changes in the appearance of the lung parenchyma. 2. There is also diffuse bronchial wall thickening with moderate centrilobular and paraseptal emphysema; imaging findings suggestive of underlying COPD. 3. Mild dilatation of the pulmonic trunk (3.5 cm in diameter), concerning for associated pulmonary arterial hypertension. 4. Small pulmonary nodules in the lungs measuring up  to 5 mm in the right upper lobe (axial image 40 of series 9). These are nonspecific, but attention at time of repeat high-resolution chest CT is recommended to ensure stability. This recommendation follows the consensus statement: Guidelines for Management of Incidental Pulmonary Nodules Detected on CT Images: From the Fleischner Society 2017; Radiology 2017; 284:228-243. 5. Hepatic steatosis. 6. Aortic atherosclerosis. 7. 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   Aortic Atherosclerosis (ICD10-I70.0) and Emphysema (ICD10-J43.9).     Electronically Signed   By: Vinnie Langton M.D.   On: 05/12/2021 07:26      No results found.    PFT  PFT Results Latest Ref Rng & Units 04/01/2021  FVC-Predicted Pre % 67  Pre FEV1/FVC % % 82  FEV1-Pre L 2.08  FEV1-Predicted Pre % 69  DLCO uncorrected ml/min/mmHg 8.98  DLCO UNC% % 40  DLCO corrected ml/min/mmHg 8.93  DLCO COR %Predicted % 40  DLVA Predicted % 54       has a past medical history of Anxiety and depression, Bipolar disorder ([place]), Cervical cancer  ([place]), Chronic respiratory failure ([place]), Diverticulitis, Elevated LFTs, Fibromyalgia, GERD (gastroesophageal reflux disease), Hepatic steatosis, Hyperlipemia, Ileus ([place]), Osteopenia, Oxygen dependent, and RHA (rheumatoid arthritis) ([place]).   reports that she has been smoking cigarettes. She has a 99.00 pack-year smoking history. She has never used smokeless tobacco.  Past Surgical History:  Procedure Laterality Date   APPENDECTOMY     BACK SURGERY  07/2018   BREAST LUMPECTOMY Left    Benign   CHOLECYSTECTOMY     ESOPHAGOGASTRODUODENOSCOPY  02/25/2016   Small hiatal hernia. Retained food in the stomach- no evidence of gastric outlet obstruction.    FINGER SURGERY  03/2021   right index   NASAL SEPTUM SURGERY  2021   TOTAL ABDOMINAL HYSTERECTOMY W/ BILATERAL SALPINGOOPHORECTOMY     Cervical Cancer    Allergies  Allergen Reactions   Adalimumab Other (See Comments)    Cervical Cancer   Baclofen Other (See Comments)   Etanercept Other (See Comments)   Plaquenil [Hydroxychloroquine]     alopecia   Topiramate Other (See Comments)    Numbness and tingling Numbness and tingling Numbness and tingling    Armodafinil Palpitations    Immunization History  Administered Date(s) Administered   Influenza Split 07/16/2018, 06/25/2020, 09/03/2020   Influenza,inj,Quad PF,6+ Mos 09/03/2020   PFIZER(Purple Top)SARS-COV-2 Vaccination 02/13/2020, 03/09/2020    Family History  Problem Relation Age of Onset   Thyroid disease Mother    Hypertension Mother    Diabetes Mother    Other Father        Polio, encephalitis   Colon cancer Neg Hx    Stomach cancer Neg Hx    Rectal cancer Neg Hx    Esophageal cancer Neg Hx      Current Outpatient Medications:    albuterol (VENTOLIN HFA) 108 (90 Base) MCG/ACT inhaler, Inhale into the lungs., Disp: , Rfl:    budesonide-formoterol (SYMBICORT) 160-4.5 MCG/ACT inhaler, Inhale 2 puffs into the lungs in the morning and at bedtime., Disp: 10.2 g, Rfl:  6   cyclobenzaprine (FLEXERIL) 10 MG tablet, Take 10 mg by mouth 2 (two) times daily as needed., Disp: , Rfl:    fluvoxaMINE (LUVOX) 100 MG tablet, Take 100 mg by mouth 2 (two) times daily., Disp: , Rfl:    gabapentin (NEURONTIN) 800 MG tablet, Take 800 mg by mouth 3 (three) times daily., Disp: , Rfl:    HYDROcodone-acetaminophen (NORCO/VICODIN)  5-325 MG tablet, Take 1 tablet by mouth 3 (three) times daily as needed., Disp: , Rfl:    hyoscyamine (LEVSIN SL) 0.125 MG SL tablet, Place 1 tablet (0.125 mg total) under the tongue every 4 (four) hours as needed (Take 1 or 2 sublingual every 4 hours as needed for pain)., Disp: 60 tablet, Rfl: 0   metFORMIN (GLUCOPHAGE-XR) 500 MG 24 hr tablet, 1 daily with supper for diabetes, Disp: , Rfl:    mirtazapine (REMERON) 15 MG tablet, Take 15 mg by mouth at bedtime., Disp: , Rfl:    ondansetron (ZOFRAN ODT) 4 MG disintegrating tablet, Take 1 tablet (4 mg total) by mouth every 8 (eight) hours as needed for nausea or vomiting., Disp: 30 tablet, Rfl: 2   ondansetron (ZOFRAN) 4 MG tablet, Take 1 tablet (4 mg total) by mouth every 6 (six) hours as needed for nausea or vomiting., Disp: 30 tablet, Rfl: 2   Oxcarbazepine (TRILEPTAL) 300 MG tablet, Take 300 mg by mouth at bedtime., Disp: , Rfl:    pantoprazole (PROTONIX) 40 MG tablet, Take 1 tablet (40 mg total) by mouth daily., Disp: 90 tablet, Rfl: 3   prazosin (MINIPRESS) 1 MG capsule, Take 1 mg by mouth at bedtime., Disp: , Rfl:    risperiDONE (RISPERDAL) 0.25 MG tablet, Take 1 tablet by mouth 2 (two) times daily., Disp: , Rfl:    rosuvastatin (CRESTOR) 10 MG tablet, Take 10 mg by mouth daily., Disp: , Rfl:    Tiotropium Bromide Monohydrate 2.5 MCG/ACT AERS, Inhale 2 puffs into the lungs daily., Disp: , Rfl:       Objective:   Vitals:   09/02/21 0933  BP: 126/70  Pulse: 74  Temp: 98 F (36.7 C)  TempSrc: Oral  SpO2: 97%  Weight: 232 lb 3.2 oz (105.3 kg)  Height: 5\' 5"  (1.651 m)    Estimated body mass  index is 38.64 kg/m as calculated from the following:   Height as of this encounter: 5\' 5"  (1.651 m).   Weight as of this encounter: 232 lb 3.2 oz (105.3 kg).  @WEIGHTCHANGE @  Filed Weights   09/02/21 0933  Weight: 232 lb 3.2 oz (105.3 kg)     Physical ExamGeneral: No distress.  Morbidly obese Neuro: Alert and Oriented x 3. GCS 15. Speech normal Psych: Pleasant Resp:  Barrel Chest - no.  Wheeze - no, Crackles -possible crackles at the base, No overt respiratory distress CVS: Normal heart sounds. Murmurs - no Ext: Stigmata of Connective Tissue Disease - no HEENT: Normal upper airway. PEERL +. No post nasal drip        Assessment:       ICD-10-CM   1. ILD (interstitial lung disease) ([place])  J84.9     2. History of rheumatoid arthritis  Z87.39     3. Gastroesophageal reflux disease, unspecified whether esophagitis present  K21.9     4. History of smoking greater than 50 pack years  Z87.891     5. Long-term exposure involving bird droppings  Z77.29     6. 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 Multiple pulmonary nodules determined by computed tomography of lung  R91.8      Interstitial lung disease: Wide range of possibilities with "alternative to UIP" features on the CT scan.  Per Fleischner criteria this is inconsistent with UIP.  Overall these patients have less than 40% incidence of UIP which is still significant.  UIP is a marker for prognosis.  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  Also given the fact that she has significant bird exposure she could have hypersensitive pneumonitis although would expect these to be in the upper lobes.  Nevertheless the algorithm by ATS calls for biopsy.  We discussed two-stage procedure of doing bronchoscopy with lavage and transbronchial biopsy for RNA genomic classifier and if nondiagnostic going to a surgical lung biopsy.  Discussed surgical lung biopsy in brief about the indication  for general anesthesia surgical incision.  Recovery.  But surgical lung biopsy is the gold standard.  She decided to go surgical lung biopsy.  Therefore make a referral to Dr. Modesto Charon.  For now she is going to keep a bird and will address smoking and getting rid of the bird at follow-up after surgical lung biopsy  Multiple lung nodules: These are mostly in the upper lobes.  Hopefully the surgical lung biopsy might show incidental nodules.  Suspect this could be from rheumatoid arthritis but with the smoking this required to be watched  Mild enlargement of the pulmonary artery: We will get an echocardiogram blood test for BNP before she sees Dr. Roxan Hockey  Respiratory symptoms: For some reason Joselyn Arrow has not been done.  Therefore we will reorder it.  She stopped her lisinopril and her cough is some better.  Mild emphysema: At next visit we will start Spiriva and also check alpha-1    Plan:     Patient Instructions     ICD-10-CM   1. ILD (interstitial lung disease) ([place])  J84.9     2. History of rheumatoid arthritis  Z87.39     3. Gastroesophageal reflux disease, unspecified whether esophagitis present  K21.9     4. History of smoking greater than 50 pack years  Z87.891     5. Long-term exposure involving bird droppings  Z77.29     6. 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 Multiple pulmonary nodules determined by computed tomography of lung  R91.8       Glad cough is some better after stopping lisinopril   - will list it as allergy  The condition you have is called interstitial lung disease  In your particular case it could be related to rheumatoid arthritis, smoking, or bird related condition called chronic HP or acid reflux of another condition called UIP  You  need a biopsy to sort out why you have this condition  Plan - make sure heart ok before bioopsy  - do BNP , pt, ptt blood test 09/02/2021  - do echo next severa days to few weeks - refer DR  Roxan Hockey for evaluation of surgical lung biopsy and bronchoscopy with lavage   - we dicussed option of just doing bronch first but felt surgical biopsy was definitive and you preferrred thsi approach  - try to quit smoking  - decision on bird after biopsy  Follow-up - early Dec 2022 - 30 min slot   - by this time you should have had biopsy   ( Level 05 visit: est 40-54 min   in  visit type: on-site physical face to visit  in total care time and counseling or/and coordination of care by this undersigned MD - Dr Brand Males. This includes one or more of the following on this same day 09/02/2021: pre-charting, chart review, note writing, documentation discussion of test results, diagnostic or treatment recommendations, prognosis, risks and benefits of management options, instructions, education, compliance or risk-factor reduction. It  excludes time spent by the [place] or office staff in the care of the patient. Actual time 50 min)    SIGNATURE    Dr. Brand Males, M.D., F.C.C.P,  Pulmonary and Critical Care Medicine Staff Physician, [facility] Director - Interstitial Lung Disease  Program  [facility] [place], [place], [address]  Pager: [phone], If no answer or between  15:00h - 7:00h: call [phone] Telephone: [phone]  10:15 AM 09/02/2021

## 2021-09-09 ENCOUNTER — Encounter (HOSPITAL_COMMUNITY): Payer: Self-pay | Admitting: Cardiovascular Disease

## 2021-09-14 NOTE — Telephone Encounter (Signed)
Routed to MR by mistake. Routing to PCCs.

## 2021-09-14 NOTE — Telephone Encounter (Signed)
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  Spoke to Janyth [place] office who asked me to fax the order to [phone].  Rec'd fax confirm.

## 2021-09-16 ENCOUNTER — Encounter: Payer: Medicare Other | Admitting: Thoracic Surgery (Cardiothoracic Vascular Surgery)

## 2021-09-20 ENCOUNTER — Other Ambulatory Visit: Payer: Self-pay

## 2021-09-20 ENCOUNTER — Encounter: Payer: Self-pay | Admitting: *Deleted

## 2021-09-20 ENCOUNTER — Encounter: Payer: Self-pay | Admitting: Thoracic Surgery (Cardiothoracic Vascular Surgery)

## 2021-09-20 ENCOUNTER — Institutional Professional Consult (permissible substitution) (INDEPENDENT_AMBULATORY_CARE_PROVIDER_SITE_OTHER): Payer: Medicare Other | Admitting: Thoracic Surgery (Cardiothoracic Vascular Surgery)

## 2021-09-20 ENCOUNTER — Other Ambulatory Visit: Payer: Self-pay | Admitting: *Deleted

## 2021-09-20 VITALS — BP 139/79 | HR 74 | Resp 20 | Ht 65.0 in | Wt 233.8 lb

## 2021-09-20 DIAGNOSIS — J849 Interstitial pulmonary disease, unspecified: Secondary | ICD-10-CM

## 2021-09-20 NOTE — H&P (View-Only) (Signed)
PCP is Maris Berger, MD Referring Provider is Brand Males, MD  Chief Complaint  Patient presents with   Interstitial Lung Disease    New patient consultation Chest CT 05/11/21, PFTs 04/01/21    HPI: Lacey Erickson is sent for consultation for possible lung biopsy.  Lacey Erickson is a 45 year old woman with a history of tobacco abuse, rheumatoid arthritis, bipolar disorder, fibromyalgia, hyperlipidemia, reflux, cervical cancer, anxiety, and depression.  Lacey Erickson has been having issues with shortness of breath for about a year and a half now.  Lacey Erickson currently gets short of breath even with walking from room to room in Lacey house.  Lacey Erickson continues to smoke.    Lacey Erickson saw Dr. Chase Caller. A high resolution CT showed ILD not consistent with UIP.  After discussion they decided to ursue surgical lung biopsy.  Zubrod Score: At the time of surgery this patient's most appropriate activity status/level should be described as: []     0    Normal activity, no symptoms []     1    Restricted in physical strenuous activity but ambulatory, able to do out light work [x]     2    Ambulatory and capable of self care, unable to do work activities, up and about >50 % of waking hours                              []     3    Only limited self care, in bed greater than 50% of waking hours []     4    Completely disabled, no self care, confined to bed or chair []     5    Moribund  Past Medical History:  Diagnosis Date   Anxiety and depression    Bipolar disorder (HCC)    Cervical cancer ([place])    Chronic respiratory failure ([place])    Diverticulitis    Elevated LFTs    Fibromyalgia    GERD (gastroesophageal reflux disease)    Hepatic steatosis    Hyperlipemia    Ileus ([place])    Osteopenia    Oxygen dependent    RHA (rheumatoid arthritis) ([place])     Past Surgical History:  Procedure Laterality Date   APPENDECTOMY     BACK SURGERY  07/2018   BREAST LUMPECTOMY Left    Benign   CHOLECYSTECTOMY      ESOPHAGOGASTRODUODENOSCOPY  02/25/2016   Small hiatal hernia. Retained food in the stomach- no evidence of gastric outlet obstruction.    FINGER SURGERY  03/2021   right index   NASAL SEPTUM SURGERY  2021   TOTAL ABDOMINAL HYSTERECTOMY W/ BILATERAL SALPINGOOPHORECTOMY     Cervical Cancer    Family History  Problem Relation Age of Onset   Thyroid disease Mother    Hypertension Mother    Diabetes Mother    Other Father        Polio, encephalitis   Colon cancer Neg Hx    Stomach cancer Neg Hx    Rectal cancer Neg Hx    Esophageal cancer Neg Hx     Social History Social History   Tobacco Use   Smoking status: Every Day    Packs/day: 3.00    Years: 33.00    Pack years: 99.00    Types: Cigarettes   Smokeless tobacco: Never   Tobacco comments:    currently smoking 1.5ppd as of 09/02/21 ep  Vaping Use   Vaping Use: Never used  Substance Use Topics   Alcohol use: Not Currently   Drug use: Never    Current Outpatient Medications  Medication Sig Dispense Refill   albuterol (VENTOLIN HFA) 108 (90 Base) MCG/ACT inhaler Inhale into the lungs.     budesonide-formoterol (SYMBICORT) 160-4.5 MCG/ACT inhaler Inhale 2 puffs into the lungs in the morning and at bedtime. 10.2 g 6   cyclobenzaprine (FLEXERIL) 10 MG tablet Take 10 mg by mouth 2 (two) times daily as needed.     fluvoxaMINE (LUVOX) 100 MG tablet Take 100 mg by mouth 2 (two) times daily.     gabapentin (NEURONTIN) 800 MG tablet Take 800 mg by mouth 3 (three) times daily.     HYDROcodone-acetaminophen (NORCO/VICODIN) 5-325 MG tablet Take 1 tablet by mouth 3 (three) times daily as needed.     hyoscyamine (LEVSIN SL) 0.125 MG SL tablet Place 1 tablet (0.125 mg total) under the tongue every 4 (four) hours as needed (Take 1 or 2 sublingual every 4 hours as needed for pain). 60 tablet 0   metFORMIN (GLUCOPHAGE-XR) 500 MG 24 hr tablet 1 daily with supper for diabetes     mirtazapine (REMERON) 15 MG tablet Take 15 mg by mouth at  bedtime.     ondansetron (ZOFRAN ODT) 4 MG disintegrating tablet Take 1 tablet (4 mg total) by mouth every 8 (eight) hours as needed for nausea or vomiting. 30 tablet 2   ondansetron (ZOFRAN) 4 MG tablet Take 1 tablet (4 mg total) by mouth every 6 (six) hours as needed for nausea or vomiting. 30 tablet 2   Oxcarbazepine (TRILEPTAL) 300 MG tablet Take 300 mg by mouth at bedtime.     pantoprazole (PROTONIX) 40 MG tablet Take 1 tablet (40 mg total) by mouth daily. 90 tablet 3   prazosin (MINIPRESS) 1 MG capsule Take 1 mg by mouth at bedtime.     risperiDONE (RISPERDAL) 0.25 MG tablet Take 1 tablet by mouth 2 (two) times daily.     rosuvastatin (CRESTOR) 10 MG tablet Take 10 mg by mouth daily.     Tiotropium Bromide Monohydrate 2.5 MCG/ACT AERS Inhale 2 puffs into the lungs daily.     No current facility-administered medications for this visit.    Allergies  Allergen Reactions   Adalimumab Other (See Comments)    Cervical Cancer   Baclofen Other (See Comments)   Etanercept Other (See Comments)   Plaquenil [Hydroxychloroquine]     alopecia   Topiramate Other (See Comments)    Numbness and tingling Numbness and tingling Numbness and tingling    Armodafinil Palpitations    Review of Systems  Constitutional:  Positive for appetite change and fatigue. Negative for unexpected weight change.  HENT:  Positive for dental problem and hearing loss.   Eyes:  Positive for visual disturbance.  Respiratory:  Positive for cough, shortness of breath and wheezing.   Cardiovascular:  Positive for chest pain and leg swelling.  Gastrointestinal:  Positive for abdominal distention, abdominal pain and diarrhea.  Genitourinary:  Positive for difficulty urinating and frequency.  Musculoskeletal:  Positive for arthralgias, back pain (gabapentin and Vicodin), gait problem and myalgias.  Neurological:  Positive for dizziness.  Hematological:  Bruises/bleeds easily.  Psychiatric/Behavioral:  Positive for  dysphoric mood. The patient is nervous/anxious.    BP 139/79 (BP Location: Left Arm, Patient Position: Sitting, Cuff Size: Large)   Pulse 74   Resp 20   Ht 5\' 5"  (1.651 m)   Wt 233 lb 12.8 oz (106.1 kg)  SpO2 96% Comment: RA  BMI 38.91 kg/m  Physical Exam Vitals reviewed.  Constitutional:      General: Lacey Erickson is not in acute distress.    Appearance: Lacey Erickson is obese.  HENT:     Head: Normocephalic and atraumatic.  Eyes:     General: No scleral icterus.    Extraocular Movements: Extraocular movements intact.  Neck:     Vascular: No carotid bruit.  Cardiovascular:     Rate and Rhythm: Normal rate and regular rhythm.     Heart sounds: Normal heart sounds. No murmur heard.   No friction rub. No gallop.  Pulmonary:     Effort: Pulmonary effort is normal. No respiratory distress.     Breath sounds: Normal breath sounds. No wheezing.  Abdominal:     General: There is no distension.     Palpations: Abdomen is soft.  Lymphadenopathy:     Cervical: No cervical adenopathy.  Skin:    General: Skin is warm and dry.  Neurological:     General: No focal deficit present.     Mental Status: Lacey Erickson is oriented to person, place, and time.     Cranial Nerves: No cranial nerve deficit.     Motor: No weakness.     Diagnostic Tests: CT CHEST WITHOUT CONTRAST   TECHNIQUE: Multidetector CT imaging of the chest was performed following the standard protocol without intravenous contrast. High resolution imaging of the lungs, as well as inspiratory and expiratory imaging, was performed.   COMPARISON:  Chest CT 08/27/2020.   FINDINGS: Cardiovascular: Heart size is normal. There is no significant pericardial fluid, thickening or pericardial calcification. Aortic atherosclerosis. No definite coronary artery calcifications. 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 Pulmonic trunk is mildly dilated measuring 3.5 cm in  diameter.   Mediastinum/Nodes: No pathologically enlarged mediastinal or hilar lymph nodes. Please note that accurate exclusion of hilar adenopathy is limited on noncontrast CT scans. Esophagus is unremarkable in appearance. No axillary lymphadenopathy.   Lungs/Pleura: High-resolution images demonstrate generalized ground-glass attenuation in some very mild interlobular septal thickening most evident throughout the mid to lower lungs bilaterally. No subpleural reticulation, traction bronchiectasis or honeycombing. Inspiratory and expiratory imaging demonstrates some mild air trapping indicative of mild small airways disease. There also some more focal areas of ground-glass attenuation which are somewhat nodular in appearance, most notably in the periphery of the right lower lobe demonstrated on axial image 75 of series 9 where the largest of these regions measures up to 1.1 x 1.0 cm. No acute consolidative airspace disease. No pleural effusions. Diffuse bronchial wall thickening with moderate centrilobular and paraseptal emphysema. Small pulmonary nodules are noted measuring up to 5 mm in the right upper lobe (axial image 40 of series 9).   Upper Abdomen: Diffuse low attenuation throughout the visualized hepatic parenchyma, indicative of a background of hepatic steatosis.   Musculoskeletal: Chronic appearing compression fracture of L1 with 25% loss of anterior vertebral body height and post vertebroplasty changes. There are no aggressive appearing lytic or blastic lesions noted in the visualized portions of the skeleton.   IMPRESSION: 1. There are findings in the lungs concerning for interstitial lung disease, with a spectrum considered most compatible with an alternative diagnosis (not usual interstitial pneumonia). Specifically, findings are concerning for potential disclaimer of interstitial pneumonia (DIP). Repeat high-resolution chest CT is recommended in 12 months to assess  for temporal changes in the appearance of the lung parenchyma. 2. There  is also diffuse bronchial wall thickening with moderate centrilobular and paraseptal emphysema; imaging findings suggestive of underlying COPD. 3. Mild dilatation of the pulmonic trunk (3.5 cm in diameter), concerning for associated pulmonary arterial hypertension. 4. Small pulmonary nodules in the lungs measuring up to 5 mm in the right upper lobe (axial image 40 of series 9). These are nonspecific, but attention at time of repeat high-resolution chest CT is recommended to ensure stability. This recommendation follows the consensus statement: Guidelines for Management of Incidental Pulmonary Nodules Detected on CT Images: From the Fleischner Society 2017; Radiology 2017; 284:228-243. 5. Hepatic steatosis. 6. Aortic atherosclerosis. 7. 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   Aortic Atherosclerosis (ICD10-I70.0) and Emphysema (ICD10-J43.9).     Electronically Signed   By: Vinnie Langton M.D.   On: 05/12/2021 07:26 I personally reviewed the CT images.  There is diffuse groundglass opacity with some more nodular areas.  Pulmonary function testing 04/01/2021 FVC 67% predicted FEV1 2.08 (69%) DLCO 8.93 (40%)  Impression: Baelyn Doring is a 45 year old woman with a history of tobacco abuse, rheumatoid arthritis, bipolar disorder, fibromyalgia, hyperlipidemia, reflux, cervical cancer, anxiety, and depression.  Lacey Erickson has had progressive dyspnea over the past 18 months. CT shows ILD not consistent with UIP. Lacey Erickson is now referred for surgical lung biopsy.  ILD- multiple potential contributing factors including tobacco abuse and exposure to birds. Needs lung biopsy for diagnosis and to guide therapy.  Tobacco abuse- emphasized need for tobacco cessation. Told Lacey any benefit to treatment will be offset with damage from smoking unless Lacey Erickson  quits.  Chest tightness.  Likely ILD related.  Cannot completely rule out angina but no coronary atherosclerosis noted on CT images. Lacey Erickson is having a 2D Echo.  If there are any regional wall motion abnormalities on echo will need formal cardiac workup.   I discussed the proposed procedure of robotic right VATS for lung biopsy with Lacey Erickson and Lacey Erickson.  I informed them of the general nature of the procedure, including the need for general anesthesia, the incisions to be used and the use of a drainage tube postoperatively.  They understand this is diagnostic only.  I informed them of the indications, risks, benefits and the alternatives.  They understand the risks include, but are not limited to death, MI, DVT, PE, bleeding, possible need for transfusion, infection, air leak, cardiac arrhythmias, as well as the possibility of other unforeseeable complications.  Lacey Erickson accepts the risks and agrees to proceed.  Plan: Echocardiogram as ordered by Dr. Chase Caller (scheduled 10/11/2021) Robotic right VATS for lung biopsy on 10/15/2021   [place]rose Nakayama, MD [facility] [phone]

## 2021-09-20 NOTE — Progress Notes (Addendum)
PCP is Maris Berger, MD Referring Provider is Brand Males, MD  Chief Complaint  Patient presents with   Interstitial Lung Disease    New patient consultation Chest CT 05/11/21, PFTs 04/01/21    HPI: Mrs. Roskos is sent for consultation for possible lung biopsy.  Hila Bolding is a 45 year old woman with a history of tobacco abuse, rheumatoid arthritis, bipolar disorder, fibromyalgia, hyperlipidemia, reflux, cervical cancer, anxiety, and depression.  She has been having issues with shortness of breath for about a year and a half now.  She currently gets short of breath even with walking from room to room in her house.  She continues to smoke.    She saw Dr. Chase Caller. A high resolution CT showed ILD not consistent with UIP.  After discussion they decided to ursue surgical lung biopsy.  Zubrod Score: At the time of surgery this patient's most appropriate activity status/level should be described as: []     0    Normal activity, no symptoms []     1    Restricted in physical strenuous activity but ambulatory, able to do out light work [x]     2    Ambulatory and capable of self care, unable to do work activities, up and about >50 % of waking hours                              []     3    Only limited self care, in bed greater than 50% of waking hours []     4    Completely disabled, no self care, confined to bed or chair []     5    Moribund  Past Medical History:  Diagnosis Date   Anxiety and depression    Bipolar disorder (HCC)    Cervical cancer ([place])    Chronic respiratory failure ([place])    Diverticulitis    Elevated LFTs    Fibromyalgia    GERD (gastroesophageal reflux disease)    Hepatic steatosis    Hyperlipemia    Ileus ([place])    Osteopenia    Oxygen dependent    RHA (rheumatoid arthritis) ([place])     Past Surgical History:  Procedure Laterality Date   APPENDECTOMY     BACK SURGERY  07/2018   BREAST LUMPECTOMY Left    Benign   CHOLECYSTECTOMY      ESOPHAGOGASTRODUODENOSCOPY  02/25/2016   Small hiatal hernia. Retained food in the stomach- no evidence of gastric outlet obstruction.    FINGER SURGERY  03/2021   right index   NASAL SEPTUM SURGERY  2021   TOTAL ABDOMINAL HYSTERECTOMY W/ BILATERAL SALPINGOOPHORECTOMY     Cervical Cancer    Family History  Problem Relation Age of Onset   Thyroid disease Mother    Hypertension Mother    Diabetes Mother    Other Father        Polio, encephalitis   Colon cancer Neg Hx    Stomach cancer Neg Hx    Rectal cancer Neg Hx    Esophageal cancer Neg Hx     Social History Social History   Tobacco Use   Smoking status: Every Day    Packs/day: 3.00    Years: 33.00    Pack years: 99.00    Types: Cigarettes   Smokeless tobacco: Never   Tobacco comments:    currently smoking 1.5ppd as of 09/02/21 ep  Vaping Use   Vaping Use: Never used  Substance Use Topics   Alcohol use: Not Currently   Drug use: Never    Current Outpatient Medications  Medication Sig Dispense Refill   albuterol (VENTOLIN HFA) 108 (90 Base) MCG/ACT inhaler Inhale into the lungs.     budesonide-formoterol (SYMBICORT) 160-4.5 MCG/ACT inhaler Inhale 2 puffs into the lungs in the morning and at bedtime. 10.2 g 6   cyclobenzaprine (FLEXERIL) 10 MG tablet Take 10 mg by mouth 2 (two) times daily as needed.     fluvoxaMINE (LUVOX) 100 MG tablet Take 100 mg by mouth 2 (two) times daily.     gabapentin (NEURONTIN) 800 MG tablet Take 800 mg by mouth 3 (three) times daily.     HYDROcodone-acetaminophen (NORCO/VICODIN) 5-325 MG tablet Take 1 tablet by mouth 3 (three) times daily as needed.     hyoscyamine (LEVSIN SL) 0.125 MG SL tablet Place 1 tablet (0.125 mg total) under the tongue every 4 (four) hours as needed (Take 1 or 2 sublingual every 4 hours as needed for pain). 60 tablet 0   metFORMIN (GLUCOPHAGE-XR) 500 MG 24 hr tablet 1 daily with supper for diabetes     mirtazapine (REMERON) 15 MG tablet Take 15 mg by mouth at  bedtime.     ondansetron (ZOFRAN ODT) 4 MG disintegrating tablet Take 1 tablet (4 mg total) by mouth every 8 (eight) hours as needed for nausea or vomiting. 30 tablet 2   ondansetron (ZOFRAN) 4 MG tablet Take 1 tablet (4 mg total) by mouth every 6 (six) hours as needed for nausea or vomiting. 30 tablet 2   Oxcarbazepine (TRILEPTAL) 300 MG tablet Take 300 mg by mouth at bedtime.     pantoprazole (PROTONIX) 40 MG tablet Take 1 tablet (40 mg total) by mouth daily. 90 tablet 3   prazosin (MINIPRESS) 1 MG capsule Take 1 mg by mouth at bedtime.     risperiDONE (RISPERDAL) 0.25 MG tablet Take 1 tablet by mouth 2 (two) times daily.     rosuvastatin (CRESTOR) 10 MG tablet Take 10 mg by mouth daily.     Tiotropium Bromide Monohydrate 2.5 MCG/ACT AERS Inhale 2 puffs into the lungs daily.     No current facility-administered medications for this visit.    Allergies  Allergen Reactions   Adalimumab Other (See Comments)    Cervical Cancer   Baclofen Other (See Comments)   Etanercept Other (See Comments)   Plaquenil [Hydroxychloroquine]     alopecia   Topiramate Other (See Comments)    Numbness and tingling Numbness and tingling Numbness and tingling    Armodafinil Palpitations    Review of Systems  Constitutional:  Positive for appetite change and fatigue. Negative for unexpected weight change.  HENT:  Positive for dental problem and hearing loss.   Eyes:  Positive for visual disturbance.  Respiratory:  Positive for cough, shortness of breath and wheezing.   Cardiovascular:  Positive for chest pain and leg swelling.  Gastrointestinal:  Positive for abdominal distention, abdominal pain and diarrhea.  Genitourinary:  Positive for difficulty urinating and frequency.  Musculoskeletal:  Positive for arthralgias, back pain (gabapentin and Vicodin), gait problem and myalgias.  Neurological:  Positive for dizziness.  Hematological:  Bruises/bleeds easily.  Psychiatric/Behavioral:  Positive for  dysphoric mood. The patient is nervous/anxious.    BP 139/79 (BP Location: Left Arm, Patient Position: Sitting, Cuff Size: Large)   Pulse 74   Resp 20   Ht 5\' 5"  (1.651 m)   Wt 233 lb 12.8 oz (106.1 kg)  SpO2 96% Comment: RA  BMI 38.91 kg/m  Physical Exam Vitals reviewed.  Constitutional:      General: She is not in acute distress.    Appearance: She is obese.  HENT:     Head: Normocephalic and atraumatic.  Eyes:     General: No scleral icterus.    Extraocular Movements: Extraocular movements intact.  Neck:     Vascular: No carotid bruit.  Cardiovascular:     Rate and Rhythm: Normal rate and regular rhythm.     Heart sounds: Normal heart sounds. No murmur heard.   No friction rub. No gallop.  Pulmonary:     Effort: Pulmonary effort is normal. No respiratory distress.     Breath sounds: Normal breath sounds. No wheezing.  Abdominal:     General: There is no distension.     Palpations: Abdomen is soft.  Lymphadenopathy:     Cervical: No cervical adenopathy.  Skin:    General: Skin is warm and dry.  Neurological:     General: No focal deficit present.     Mental Status: She is oriented to person, place, and time.     Cranial Nerves: No cranial nerve deficit.     Motor: No weakness.     Diagnostic Tests: CT CHEST WITHOUT CONTRAST   TECHNIQUE: Multidetector CT imaging of the chest was performed following the standard protocol without intravenous contrast. High resolution imaging of the lungs, as well as inspiratory and expiratory imaging, was performed.   COMPARISON:  Chest CT 08/27/2020.   FINDINGS: Cardiovascular: Heart size is normal. There is no significant pericardial fluid, thickening or pericardial calcification. Aortic atherosclerosis. No definite coronary artery calcifications. 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 Pulmonic trunk is mildly dilated measuring 3.5 cm in  diameter.   Mediastinum/Nodes: No pathologically enlarged mediastinal or hilar lymph nodes. Please note that accurate exclusion of hilar adenopathy is limited on noncontrast CT scans. Esophagus is unremarkable in appearance. No axillary lymphadenopathy.   Lungs/Pleura: High-resolution images demonstrate generalized ground-glass attenuation in some very mild interlobular septal thickening most evident throughout the mid to lower lungs bilaterally. No subpleural reticulation, traction bronchiectasis or honeycombing. Inspiratory and expiratory imaging demonstrates some mild air trapping indicative of mild small airways disease. There also some more focal areas of ground-glass attenuation which are somewhat nodular in appearance, most notably in the periphery of the right lower lobe demonstrated on axial image 75 of series 9 where the largest of these regions measures up to 1.1 x 1.0 cm. No acute consolidative airspace disease. No pleural effusions. Diffuse bronchial wall thickening with moderate centrilobular and paraseptal emphysema. Small pulmonary nodules are noted measuring up to 5 mm in the right upper lobe (axial image 40 of series 9).   Upper Abdomen: Diffuse low attenuation throughout the visualized hepatic parenchyma, indicative of a background of hepatic steatosis.   Musculoskeletal: Chronic appearing compression fracture of L1 with 25% loss of anterior vertebral body height and post vertebroplasty changes. There are no aggressive appearing lytic or blastic lesions noted in the visualized portions of the skeleton.   IMPRESSION: 1. There are findings in the lungs concerning for interstitial lung disease, with a spectrum considered most compatible with an alternative diagnosis (not usual interstitial pneumonia). Specifically, findings are concerning for potential disclaimer of interstitial pneumonia (DIP). Repeat high-resolution chest CT is recommended in 12 months to assess  for temporal changes in the appearance of the lung parenchyma. 2. There  is also diffuse bronchial wall thickening with moderate centrilobular and paraseptal emphysema; imaging findings suggestive of underlying COPD. 3. Mild dilatation of the pulmonic trunk (3.5 cm in diameter), concerning for associated pulmonary arterial hypertension. 4. Small pulmonary nodules in the lungs measuring up to 5 mm in the right upper lobe (axial image 40 of series 9). These are nonspecific, but attention at time of repeat high-resolution chest CT is recommended to ensure stability. This recommendation follows the consensus statement: Guidelines for Management of Incidental Pulmonary Nodules Detected on CT Images: From the Fleischner Society 2017; Radiology 2017; 284:228-243. 5. Hepatic steatosis. 6. Aortic atherosclerosis. 7. 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   Aortic Atherosclerosis (ICD10-I70.0) and Emphysema (ICD10-J43.9).     Electronically Signed   By: Vinnie Langton M.D.   On: 05/12/2021 07:26 I personally reviewed the CT images.  There is diffuse groundglass opacity with some more nodular areas.  Pulmonary function testing 04/01/2021 FVC 67% predicted FEV1 2.08 (69%) DLCO 8.93 (40%)  Impression: Lacey Erickson is a 45 year old woman with a history of tobacco abuse, rheumatoid arthritis, bipolar disorder, fibromyalgia, hyperlipidemia, reflux, cervical cancer, anxiety, and depression.  She has had progressive dyspnea over the past 18 months. CT shows ILD not consistent with UIP. She is now referred for surgical lung biopsy.  ILD- multiple potential contributing factors including tobacco abuse and exposure to birds. Needs lung biopsy for diagnosis and to guide therapy.  Tobacco abuse- emphasized need for tobacco cessation. Told her any benefit to treatment will be offset with damage from smoking unless she  quits.  Chest tightness.  Likely ILD related.  Cannot completely rule out angina but no coronary atherosclerosis noted on CT images. She is having a 2D Echo.  If there are any regional wall motion abnormalities on echo will need formal cardiac workup.   I discussed the proposed procedure of robotic right VATS for lung biopsy with Mrs. Gintz and her husband.  I informed them of the general nature of the procedure, including the need for general anesthesia, the incisions to be used and the use of a drainage tube postoperatively.  They understand this is diagnostic only.  I informed them of the indications, risks, benefits and the alternatives.  They understand the risks include, but are not limited to death, MI, DVT, PE, bleeding, possible need for transfusion, infection, air leak, cardiac arrhythmias, as well as the possibility of other unforeseeable complications.  She accepts the risks and agrees to proceed.  Plan: Echocardiogram as ordered by Dr. Chase Caller (scheduled 10/11/2021) Robotic right VATS for lung biopsy on 10/15/2021   Melrose Nakayama, MD [facility] [phone]

## 2021-10-03 ENCOUNTER — Other Ambulatory Visit: Payer: Self-pay | Admitting: Internal Medicine

## 2021-10-07 ENCOUNTER — Telehealth: Payer: Self-pay | Admitting: Internal Medicine

## 2021-10-07 NOTE — Telephone Encounter (Signed)
Overnight pulse oximetry study done on 09/24/2021 shows pulse ox less than equal to 88% for 294 minutes.  No time spent in bradycardia.  25% of the time was in tachycardia which was 139 minutes.  The ODI events were 63.  Consecutive time less than equal to 88% was 498.5 minutes.  She is noticed to have colonoscopy and upper endoscopy 07/01/2021 without problems  Her BNP was 146 on 10/77/22.  I ordered an echo but I do not see the results  Plan - Please have the patient start 2 L nasal cannula at night - Please have her get an echocardiogram before her surgery on 10/15/2021 [surgeries for lung biopsy] -Please also have her get an ABG before her surgery -Please ask if she is being evaluated for sleep apnea? -If not then after the surgery she needs sleep study -Please give appointment end of December 2022 early January 2023 to discuss the biopsy results -ideally with me but it can be with nurse practitioner

## 2021-10-08 NOTE — Telephone Encounter (Signed)
Echo is scheduled for 10/11/21 so will be able to be done prior to the procedure. Pt has also had an ABG order placed by Dr. Roxan Hockey which should be done prior to surgery as well.  Pt currently has a follow up scheduled with  MR 11/11/21.  Attempted to call pt but unable to reach. Left message for her to return call.

## 2021-10-11 ENCOUNTER — Other Ambulatory Visit: Payer: Self-pay

## 2021-10-11 ENCOUNTER — Ambulatory Visit (HOSPITAL_COMMUNITY): Payer: Medicare Other | Attending: Cardiovascular Disease

## 2021-10-11 DIAGNOSIS — R0602 Shortness of breath: Secondary | ICD-10-CM

## 2021-10-11 DIAGNOSIS — E119 Type 2 diabetes mellitus without complications: Secondary | ICD-10-CM | POA: Diagnosis not present

## 2021-10-11 DIAGNOSIS — J849 Interstitial pulmonary disease, unspecified: Secondary | ICD-10-CM | POA: Diagnosis not present

## 2021-10-11 DIAGNOSIS — I10 Essential (primary) hypertension: Secondary | ICD-10-CM | POA: Insufficient documentation

## 2021-10-11 LAB — ECHOCARDIOGRAM COMPLETE
Area-P 1/2: 2.88 cm2
S' Lateral: 3.7 cm

## 2021-10-12 ENCOUNTER — Ambulatory Visit: Payer: Medicare Other | Admitting: Internal Medicine

## 2021-10-13 ENCOUNTER — Encounter (HOSPITAL_COMMUNITY)
Admission: RE | Admit: 2021-10-13 | Discharge: 2021-10-13 | Disposition: A | Discharge status: Home / Self Care | Payer: Medicare Other | Source: Ambulatory Visit | Attending: Thoracic Surgery (Cardiothoracic Vascular Surgery) | Admitting: Thoracic Surgery (Cardiothoracic Vascular Surgery)

## 2021-10-13 ENCOUNTER — Other Ambulatory Visit: Payer: Self-pay

## 2021-10-13 ENCOUNTER — Encounter (HOSPITAL_COMMUNITY): Payer: Self-pay

## 2021-10-13 ENCOUNTER — Ambulatory Visit (HOSPITAL_COMMUNITY)
Admission: RE | Admit: 2021-10-13 | Discharge: 2021-10-13 | Disposition: A | Discharge status: Home / Self Care | Payer: Medicare Other | Source: Ambulatory Visit | Attending: Thoracic Surgery (Cardiothoracic Vascular Surgery) | Admitting: Thoracic Surgery (Cardiothoracic Vascular Surgery)

## 2021-10-13 VITALS — BP 161/89 | HR 74 | Temp 98.3°F | Resp 18 | Ht 62.0 in | Wt 227.8 lb

## 2021-10-13 DIAGNOSIS — I700 Atherosclerosis of aorta: Secondary | ICD-10-CM | POA: Insufficient documentation

## 2021-10-13 DIAGNOSIS — Z01818 Encounter for other preprocedural examination: Secondary | ICD-10-CM | POA: Insufficient documentation

## 2021-10-13 DIAGNOSIS — J849 Interstitial pulmonary disease, unspecified: Secondary | ICD-10-CM | POA: Insufficient documentation

## 2021-10-13 DIAGNOSIS — Z20822 Contact with and (suspected) exposure to covid-19: Secondary | ICD-10-CM | POA: Insufficient documentation

## 2021-10-13 DIAGNOSIS — K760 Fatty (change of) liver, not elsewhere classified: Secondary | ICD-10-CM | POA: Insufficient documentation

## 2021-10-13 DIAGNOSIS — Q278 Other specified congenital malformations of peripheral vascular system: Secondary | ICD-10-CM | POA: Insufficient documentation

## 2021-10-13 HISTORY — DX: Essential (primary) hypertension: I10

## 2021-10-13 HISTORY — DX: Chronic obstructive pulmonary disease, unspecified: J44.9

## 2021-10-13 HISTORY — DX: Personal history of urinary calculi: Z87.442

## 2021-10-13 HISTORY — DX: Dyspnea, unspecified: R06.00

## 2021-10-13 HISTORY — DX: Type 2 diabetes mellitus without complications: E11.9

## 2021-10-13 LAB — COMPREHENSIVE METABOLIC PANEL
ALT: 8 U/L (ref 0–44)
AST: 11 U/L — ABNORMAL LOW (ref 15–41)
Albumin: 3.4 g/dL — ABNORMAL LOW (ref 3.5–5.0)
Alkaline Phosphatase: 122 U/L (ref 38–126)
Anion gap: 13 (ref 5–15)
BUN: 10 mg/dL (ref 6–20)
CO2: 24 mmol/L (ref 22–32)
Calcium: 9 mg/dL (ref 8.9–10.3)
Chloride: 86 mmol/L — ABNORMAL LOW (ref 98–111)
Creatinine, Ser: 0.58 mg/dL (ref 0.44–1.00)
GFR, Estimated: >60 mL/min (ref >60)
Glucose, Bld: 83 mg/dL (ref 70–99)
Potassium: 3.9 mmol/L (ref 3.5–5.1)
Sodium: 123 mmol/L — ABNORMAL LOW (ref 135–145)
Total Bilirubin: 0.3 mg/dL (ref 0.3–1.2)
Total Protein: 6.4 g/dL — ABNORMAL LOW (ref 6.5–8.1)

## 2021-10-13 LAB — TYPE AND SCREEN
ABO/RH(D): A POS
Antibody Screen: NEGATIVE

## 2021-10-13 LAB — URINALYSIS, ROUTINE W REFLEX MICROSCOPIC
Bilirubin Urine: NEGATIVE
Glucose, UA: NEGATIVE mg/dL
Hgb urine dipstick: NEGATIVE
Ketones, ur: NEGATIVE mg/dL
Nitrite: POSITIVE — AB
Protein, ur: 30 mg/dL — AB
Specific Gravity, Urine: 1.01 (ref 1.005–1.030)
pH: 6 (ref 5.0–8.0)

## 2021-10-13 LAB — URINALYSIS, MICROSCOPIC (REFLEX): WBC, UA: >50 WBC/hpf (ref 0–5)

## 2021-10-13 LAB — CBC
HCT: 42.7 % (ref 36.0–46.0)
Hemoglobin: 13.7 g/dL (ref 12.0–15.0)
MCH: 26.8 pg (ref 26.0–34.0)
MCHC: 32.1 g/dL (ref 30.0–36.0)
MCV: 83.4 fL (ref 80.0–100.0)
Platelets: 257 10*3/uL (ref 150–400)
RBC: 5.12 MIL/uL — ABNORMAL HIGH (ref 3.87–5.11)
RDW: 17 % — ABNORMAL HIGH (ref 11.5–15.5)
WBC: 10.8 10*3/uL — ABNORMAL HIGH (ref 4.0–10.5)
nRBC: 0 % (ref 0.0–0.2)

## 2021-10-13 LAB — BLOOD GAS, ARTERIAL
Acid-Base Excess: 3.6 mmol/L — ABNORMAL HIGH (ref 0.0–2.0)
Bicarbonate: 27.3 mmol/L (ref 20.0–28.0)
FIO2: 21
O2 Saturation: 93.5 %
Patient temperature: 37
pCO2 arterial: 39.7 mmHg (ref 32.0–48.0)
pH, Arterial: 7.453 — ABNORMAL HIGH (ref 7.350–7.450)
pO2, Arterial: 61 mmHg — ABNORMAL LOW (ref 83.0–108.0)

## 2021-10-13 LAB — APTT: aPTT: 32 seconds (ref 24–36)

## 2021-10-13 LAB — GLUCOSE, CAPILLARY: Glucose-Capillary: 106 mg/dL — ABNORMAL HIGH (ref 70–99)

## 2021-10-13 LAB — PROTIME-INR
INR: 0.9 (ref 0.8–1.2)
Prothrombin Time: 12.6 seconds (ref 11.4–15.2)

## 2021-10-13 LAB — SURGICAL PCR SCREEN
MRSA, PCR: POSITIVE — AB
Staphylococcus aureus: POSITIVE — AB

## 2021-10-13 LAB — SARS CORONAVIRUS 2 (TAT 6-24 HRS): SARS Coronavirus 2: NEGATIVE

## 2021-10-13 NOTE — Progress Notes (Addendum)
Surgical Instructions    Your procedure is scheduled on 10/15/21.  Report to [facility] Main Entrance "A" at 5:30 A.M., then check in with the Admitting office.  Call this number if you have problems the morning of surgery:  [phone]   If you have any questions prior to your surgery date call [phone]: Open Monday-Friday 8am-4pm    Remember:  Do not eat or drink after midnight the night before your surgery     Take these medicines the morning of surgery with A SIP OF WATER:  fluvoxaMINE (LUVOX) gabapentin (NEURONTIN)  Oxcarbazepine (TRILEPTAL)  pantoprazole (PROTONIX) risperiDONE (RISPERDAL)  sulfaSALAzine (AZULFIDINE)   If Needed: albuterol (VENTOLIN HFA)  cyclobenzaprine (FLEXERIL)  ondansetron (ZOFRAN ODT)  oxyCODONE-acetaminophen (PERCOCET) rizatriptan (MAXALT)   As of today, STOP taking any Aspirin (unless otherwise instructed by your surgeon) Aleve, Naproxen, Ibuprofen, Motrin, Advil, Goody's, BC's, all herbal medications, fish oil, and all vitamins.  WHAT DO I DO ABOUT MY DIABETES MEDICATION?   Do not take oral diabetes medicines (pills) the morning of surgery.   The day of surgery, do not take other diabetes injectables, including Byetta (exenatide), Bydureon (exenatide ER), Victoza (liraglutide), or Trulicity (dulaglutide).    HOW TO MANAGE YOUR DIABETES BEFORE AND AFTER SURGERY  Why is it important to control my blood sugar before and after surgery? Improving blood sugar levels before and after surgery helps healing and can limit problems. A way of improving blood sugar control is eating a healthy diet by:  Eating less sugar and carbohydrates  Increasing activity/exercise  Talking with your doctor about reaching your blood sugar goals High blood sugars (greater than 180 mg/dL) can raise your risk of infections and slow your recovery, so you will need to focus on controlling your diabetes during the weeks before surgery. Make sure that the doctor  who takes care of your diabetes knows about your planned surgery including the date and location.  How do I manage my blood sugar before surgery? Check your blood sugar at least 4 times a day, starting 2 days before surgery, to make sure that the level is not too high or low.  Check your blood sugar the morning of your surgery when you wake up and every 2 hours until you get to the Short Stay unit.  If your blood sugar is less than 70 mg/dL, you will need to treat for low blood sugar: Do not take insulin. Treat a low blood sugar (less than 70 mg/dL) with  cup of clear juice (cranberry or apple), 4 glucose tablets, OR glucose gel. Recheck blood sugar in 15 minutes after treatment (to make sure it is greater than 70 mg/dL). If your blood sugar is not greater than 70 mg/dL on recheck, call [phone] for further instructions. Report your blood sugar to the short stay nurse when you get to Short Stay.  If you are admitted to the hospital after surgery: Your blood sugar will be checked by the staff and you will probably be given insulin after surgery (instead of oral diabetes medicines) to make sure you have good blood sugar levels. The goal for blood sugar control after surgery is 80-180 mg/dL.    After your COVID test   You are not required to quarantine however you are required to wear a well-fitting mask when you are out and around people not in your household.  If your mask becomes wet or soiled, replace with a new one.  Wash your hands often with soap and water for  20 seconds or clean your hands with an alcohol-based hand sanitizer that contains at least 60% alcohol.  Do not share personal items.  Notify your provider: if you are in close contact with someone who has COVID  or if you develop a fever of 100.4 or greater, sneezing, cough, sore throat, shortness of breath or body aches.             Do not wear jewelry or makeup Do not wear lotions, powders, perfumes  or  deodorant. Do not shave 48 hours prior to surgery.  Do not bring valuables to the hospital. DO Not wear nail polish, gel polish, artificial nails, or any other type of covering on natural nails including finger and toenails. If patients have artificial nails, gel coating, etc. that need to be removed by a nail salon, please have this removed prior to surgery or surgery may need to be canceled/delayed if the surgeon/ anesthesia feels like the patient is unable to be adequately monitored.             [facility] is not responsible for any belongings or valuables.  Do NOT Smoke (Tobacco/Vaping)  24 hours prior to your procedure  If you use a CPAP at night, you may bring your mask for your overnight stay.   Contacts, glasses, hearing aids, dentures or partials may not be worn into surgery, please bring cases for these belongings   For patients admitted to the hospital, discharge time will be determined by your treatment team.   Patients discharged the day of surgery will not be allowed to drive home, and someone needs to stay with them for 24 hours.  NO VISITORS WILL BE ALLOWED IN PRE-OP WHERE PATIENTS ARE PREPPED FOR SURGERY.  ONLY 1 SUPPORT PERSON MAY BE PRESENT IN THE WAITING ROOM WHILE YOU ARE IN SURGERY.  IF YOU ARE TO BE ADMITTED, ONCE YOU ARE IN YOUR ROOM YOU WILL BE ALLOWED TWO (2) VISITORS. 1 (ONE) VISITOR MAY STAY OVERNIGHT BUT MUST ARRIVE TO THE ROOM BY 8pm.  Minor children may have two parents present. Special consideration for safety and communication needs will be reviewed on a case by case basis.  Special instructions:    Oral Hygiene is also important to reduce your risk of infection.  Remember - BRUSH YOUR TEETH THE MORNING OF SURGERY WITH YOUR REGULAR TOOTHPASTE   [facility]- Preparing For Surgery  Before surgery, you can play an important role. Because skin is not sterile, your skin needs to be as free of germs as possible. You can reduce the number of germs on your skin by  washing with CHG (chlorahexidine gluconate) Soap before surgery.  CHG is an antiseptic cleaner which kills germs and bonds with the skin to continue killing germs even after washing.     Please do not use if you have an allergy to CHG or antibacterial soaps. If your skin becomes reddened/irritated stop using the CHG.  Do not shave (including legs and underarms) for at least 48 hours prior to first CHG shower. It is OK to shave your face.  Please follow these instructions carefully.     Shower the NIGHT BEFORE SURGERY and the MORNING OF SURGERY with CHG Soap.   If you chose to wash your hair, wash your hair first as usual with your normal shampoo. After you shampoo, rinse your hair and body thoroughly to remove the shampoo.  Then [employer] and genitals (private parts) with your normal soap and rinse thoroughly to  remove soap.  After that Use CHG Soap as you would any other liquid soap. You can apply CHG directly to the skin and wash gently with a scrungie or a clean washcloth.   Apply the CHG Soap to your body ONLY FROM THE NECK DOWN.  Do not use on open wounds or open sores. Avoid contact with your eyes, ears, mouth and genitals (private parts). Wash Face and genitals (private parts)  with your normal soap.   Wash thoroughly, paying special attention to the area where your surgery will be performed.  Thoroughly rinse your body with warm water from the neck down.  DO NOT shower/wash with your normal soap after using and rinsing off the CHG Soap.  Pat yourself dry with a CLEAN TOWEL.  Wear CLEAN PAJAMAS to bed the night before surgery  Place CLEAN SHEETS on your bed the night before your surgery  DO NOT SLEEP WITH PETS.   Day of Surgery:  Take a shower with CHG soap. Wear Clean/Comfortable clothing the morning of surgery Do not apply any deodorants/lotions.   Remember to brush your teeth WITH YOUR REGULAR TOOTHPASTE.   Please read over the following fact sheets that you were  given.

## 2021-10-13 NOTE — Progress Notes (Signed)
PCP: Edwinna Areola, MD Cardiologist: denies Pulmonologist: Brand Males  EKG: 10/13/21 CXR: 10/13/21 ECHO: 10/11/21 - done for surgery Stress Test: denies Cardiac Cath: denies  Fasting Blood Sugar- 90-120 Checks Blood Sugar__1_ times a day  ASA/Blood Thinner: No  OSA/CPAP: No  Covid test 10/13/21 at PAT  Anesthesia Review: Yes, wears 2L 02 at [facility]  Patient denies shortness of breath, fever, cough, and chest pain at PAT appointment.  Patient verbalized understanding of instructions provided today at the PAT appointment.  Patient asked to review instructions at home and day of surgery.

## 2021-10-13 NOTE — Progress Notes (Signed)
Levonne Spiller, RN for [facility] notified of pt being positive for MRSA/MSSA.   Jacqlyn Larsen, RN

## 2021-10-14 ENCOUNTER — Other Ambulatory Visit: Payer: Self-pay | Admitting: *Deleted

## 2021-10-14 NOTE — Anesthesia Preprocedure Evaluation (Addendum)
Anesthesia Evaluation  Patient identified by MRN, date of birth, ID band Patient awake    Reviewed: Allergy & Precautions, H&P , NPO status , Patient's Chart, lab work & pertinent test results  Airway Mallampati: II   Neck ROM: full    Dental   Pulmonary shortness of breath, COPD, former smoker,    breath sounds clear to auscultation       Cardiovascular hypertension,  Rhythm:regular Rate:Normal     Neuro/Psych PSYCHIATRIC DISORDERS Anxiety Depression Bipolar Disorder  Neuromuscular disease    GI/Hepatic GERD  ,  Endo/Other  diabetes, Type 2Morbid obesity  Renal/GU      Musculoskeletal  (+) Arthritis , Fibromyalgia -  Abdominal   Peds  Hematology   Anesthesia Other Findings   Reproductive/Obstetrics                            Anesthesia Physical Anesthesia Plan  ASA: 3  Anesthesia Plan: General   Post-op Pain Management:    Induction: Intravenous  PONV Risk Score and Plan: 3 and Ondansetron, Dexamethasone, Midazolam and Treatment may vary due to age or medical condition  Airway Management Planned: Double Lumen EBT  Additional Equipment: Arterial line  Intra-op Plan:   Post-operative Plan: Extubation in OR  Informed Consent: I have reviewed the patients History and Physical, chart, labs and discussed the procedure including the risks, benefits and alternatives for the proposed anesthesia with the patient or authorized representative who has indicated his/her understanding and acceptance.     Dental advisory given  Plan Discussed with: CRNA, Anesthesiologist and Surgeon  Anesthesia Plan Comments: (PAT note by Karoline Caldwell, PA-C: Follows with pulmonologist Dr. Chase Caller for history of ILD.  Recent overnight pulse oximetry study showed pulse ox less than equal to 88% for 294 minutes. No time spent in bradycardia. 25% of the time was in tachycardia which was 139 minutes. The ODI  events were 63. Consecutive time less than equal to 88% was 498.5 minutes. She was recommended to start 2 L nasal cannula at night.  Preop labs notable for sodium 123. She doesn't appear to have a history of chronic hypoNA per records in Epic, however, she did have a CMET in care everywhere on 12/6 with sodium 127 and I don't see any documentation of that being addressed. Most recent sodium prior to that was 139 on 08/08/21.  Prior chemistry over the past 2 years has shown normal sodium.  This result was called to Dr. Hans Eden office.  He is aware, sodium will be rechecked on day of surgery decision made at that time based on result regarding ability to proceed with surgery.  EKG 10/13/21: Normal sinus rhythm. Rate 76. Right atrial enlargement  CHEST - 2 VIEW 10/13/21: COMPARISON: Chest radiograph 06/20/2020, CT chest 05/11/2021  FINDINGS: The heart is borderline enlarged, unchanged. The mediastinal contours are within normal limits.  There is no focal consolidation or pulmonary edema. There is no pleural effusion or pneumothorax. Post kyphoplasty changes at L1 are again seen. There is no acute osseous abnormality.  IMPRESSION: Stable chest with no radiographic evidence of acute cardiopulmonary process.  CT chest high resolution 05/11/21: IMPRESSION: 1. There are findings in the lungs concerning for interstitial lung disease, with a spectrum considered most compatible with an alternative diagnosis (not usual interstitial pneumonia). Specifically, findings are concerning for potential disclaimer of interstitial pneumonia (DIP). Repeat high-resolution chest CT is recommended in 12 months to assess for temporal changes in the  appearance of the lung parenchyma. 2. There is also diffuse bronchial wall thickening with moderate centrilobular and paraseptal emphysema; imaging findings suggestive of underlying COPD. 3. Mild dilatation of the pulmonic trunk (3.5 cm in diameter), concerning  for associated pulmonary arterial hypertension. 4. Small pulmonary nodules in the lungs measuring up to 5 mm in the right upper lobe (axial image 40 of series 9). These are nonspecific, but attention at time of repeat high-resolution chest CT is recommended to ensure stability. This recommendation follows the consensus statement: Guidelines for Management of Incidental Pulmonary Nodules Detected on CT Images: From the Fleischner Society 2017; Radiology 2017; 284:228-243. 5. Hepatic steatosis. 6. Aortic atherosclerosis. 7. 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  TTE 10/11/21: 1. Left ventricular ejection fraction, by estimation, is 60 to 65%. The  left ventricle has normal function. The left ventricle has no regional  wall motion abnormalities. Left ventricular diastolic parameters were  normal.  2. Right ventricular systolic function is normal. The right ventricular  size is normal.  3. Left atrial size was mildly dilated.  4. The mitral valve is abnormal. Trivial mitral valve regurgitation. No  evidence of mitral stenosis.  5. The aortic valve is tricuspid. Aortic valve regurgitation is not  visualized. No aortic stenosis is present.  6. The inferior vena cava is normal in size with greater than 50%  respiratory variability, suggesting right atrial pressure of 3 mmHg.   )       Anesthesia Quick Evaluation

## 2021-10-14 NOTE — Progress Notes (Signed)
Anesthesia Chart Review:  Follows with pulmonologist Dr. Chase Caller for history of ILD.  Recent overnight pulse oximetry study showed pulse ox less than equal to 88% for 294 minutes.  No time spent in bradycardia.  25% of the time was in tachycardia which was 139 minutes.  The ODI events were 63.  Consecutive time less than equal to 88% was 498.5 minutes. She was recommended to start 2 L nasal cannula at night.  Preop labs notable for sodium 123. She doesn't appear to have a history of chronic hypoNA per records in Epic, however, she did have a CMET in care everywhere on 12/6 with sodium 127 and I don't see any documentation of that being addressed. Most recent sodium prior to that was 139 on 08/08/21.  Prior chemistry over the past 2 years has shown normal sodium.  This result was called to Dr. Hans Eden office.  He is aware, sodium will be rechecked on day of surgery decision made at that time based on result regarding ability to proceed with surgery.  EKG 10/13/21: Normal sinus rhythm. Rate 76. Right atrial enlargement  CHEST - 2 VIEW 10/13/21: COMPARISON:  Chest radiograph 06/20/2020, CT chest 05/11/2021   FINDINGS: The heart is borderline enlarged, unchanged. The mediastinal contours are within normal limits.   There is no focal consolidation or pulmonary edema. There is no pleural effusion or pneumothorax. Post kyphoplasty changes at L1 are again seen. There is no acute osseous abnormality.   IMPRESSION: Stable chest with no radiographic evidence of acute cardiopulmonary process.  CT chest high resolution 05/11/21: IMPRESSION: 1. There are findings in the lungs concerning for interstitial lung disease, with a spectrum considered most compatible with an alternative diagnosis (not usual interstitial pneumonia). Specifically, findings are concerning for potential disclaimer of interstitial pneumonia (DIP). Repeat high-resolution chest CT is recommended in 12 months to assess for temporal  changes in the appearance of the lung parenchyma. 2. There is also diffuse bronchial wall thickening with moderate centrilobular and paraseptal emphysema; imaging findings suggestive of underlying COPD. 3. Mild dilatation of the pulmonic trunk (3.5 cm in diameter), concerning for associated pulmonary arterial hypertension. 4. Small pulmonary nodules in the lungs measuring up to 5 mm in the right upper lobe (axial image 40 of series 9). These are nonspecific, but attention at time of repeat high-resolution chest CT is recommended to ensure stability. This recommendation follows the consensus statement: Guidelines for Management of Incidental Pulmonary Nodules Detected on CT Images: From the Fleischner Society 2017; Radiology 2017; 284:228-243. 5. Hepatic steatosis. 6. Aortic atherosclerosis. 7. 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  TTE 10/11/21:  1. Left ventricular ejection fraction, by estimation, is 60 to 65%. The  left ventricle has normal function. The left ventricle has no regional  wall motion abnormalities. Left ventricular diastolic parameters were  normal.   2. Right ventricular systolic function is normal. The right ventricular  size is normal.   3. Left atrial size was mildly dilated.   4. The mitral valve is abnormal. Trivial mitral valve regurgitation. No  evidence of mitral stenosis.   5. The aortic valve is tricuspid. Aortic valve regurgitation is not  visualized. No aortic stenosis is present.   6. The inferior vena cava is normal in size with greater than 50%  respiratory variability, suggesting right atrial pressure of 3 mmHg.    Wynonia Musty [facility] Short Stay Center/Anesthesiology Phone [phone] 10/14/2021 4:04 PM

## 2021-10-15 ENCOUNTER — Inpatient Hospital Stay (HOSPITAL_COMMUNITY): Payer: Medicare Other

## 2021-10-15 ENCOUNTER — Inpatient Hospital Stay (HOSPITAL_COMMUNITY)
Admission: RE | Admit: 2021-10-15 | Discharge: 2021-10-18 | DRG: 167 | Disposition: A | Discharge status: Home / Self Care | Payer: Medicare Other | Attending: Thoracic Surgery (Cardiothoracic Vascular Surgery) | Admitting: Thoracic Surgery (Cardiothoracic Vascular Surgery)

## 2021-10-15 ENCOUNTER — Inpatient Hospital Stay (HOSPITAL_COMMUNITY): Payer: Medicare Other | Admitting: Anesthesiology

## 2021-10-15 ENCOUNTER — Encounter (HOSPITAL_COMMUNITY): Payer: Self-pay | Admitting: Thoracic Surgery (Cardiothoracic Vascular Surgery)

## 2021-10-15 ENCOUNTER — Other Ambulatory Visit: Payer: Self-pay

## 2021-10-15 ENCOUNTER — Encounter (HOSPITAL_COMMUNITY)
Admission: RE | Disposition: A | Discharge status: Home / Self Care | Payer: Self-pay | Source: Home / Self Care | Attending: Thoracic Surgery (Cardiothoracic Vascular Surgery)

## 2021-10-15 ENCOUNTER — Inpatient Hospital Stay (HOSPITAL_COMMUNITY): Payer: Medicare Other | Admitting: Physician Assistant

## 2021-10-15 DIAGNOSIS — Z20822 Contact with and (suspected) exposure to covid-19: Secondary | ICD-10-CM | POA: Diagnosis present

## 2021-10-15 DIAGNOSIS — J9610 Chronic respiratory failure, unspecified whether with hypoxia or hypercapnia: Secondary | ICD-10-CM | POA: Diagnosis present

## 2021-10-15 DIAGNOSIS — F419 Anxiety disorder, unspecified: Secondary | ICD-10-CM | POA: Diagnosis present

## 2021-10-15 DIAGNOSIS — Z8249 Family history of ischemic heart disease and other diseases of the circulatory system: Secondary | ICD-10-CM | POA: Diagnosis not present

## 2021-10-15 DIAGNOSIS — Z7951 Long term (current) use of inhaled steroids: Secondary | ICD-10-CM | POA: Diagnosis not present

## 2021-10-15 DIAGNOSIS — Z9981 Dependence on supplemental oxygen: Secondary | ICD-10-CM | POA: Diagnosis not present

## 2021-10-15 DIAGNOSIS — E871 Hypo-osmolality and hyponatremia: Secondary | ICD-10-CM | POA: Diagnosis present

## 2021-10-15 DIAGNOSIS — J849 Interstitial pulmonary disease, unspecified: Principal | ICD-10-CM | POA: Diagnosis present

## 2021-10-15 DIAGNOSIS — F32A Depression, unspecified: Secondary | ICD-10-CM | POA: Diagnosis present

## 2021-10-15 DIAGNOSIS — Z79899 Other long term (current) drug therapy: Secondary | ICD-10-CM | POA: Diagnosis not present

## 2021-10-15 DIAGNOSIS — Z8541 Personal history of malignant neoplasm of cervix uteri: Secondary | ICD-10-CM | POA: Diagnosis not present

## 2021-10-15 DIAGNOSIS — Z7984 Long term (current) use of oral hypoglycemic drugs: Secondary | ICD-10-CM | POA: Diagnosis not present

## 2021-10-15 DIAGNOSIS — E8770 Fluid overload, unspecified: Secondary | ICD-10-CM | POA: Diagnosis not present

## 2021-10-15 DIAGNOSIS — E119 Type 2 diabetes mellitus without complications: Secondary | ICD-10-CM | POA: Diagnosis present

## 2021-10-15 DIAGNOSIS — K219 Gastro-esophageal reflux disease without esophagitis: Secondary | ICD-10-CM | POA: Diagnosis present

## 2021-10-15 DIAGNOSIS — Z4682 Encounter for fitting and adjustment of non-vascular catheter: Secondary | ICD-10-CM

## 2021-10-15 DIAGNOSIS — Z8349 Family history of other endocrine, nutritional and metabolic diseases: Secondary | ICD-10-CM

## 2021-10-15 DIAGNOSIS — M797 Fibromyalgia: Secondary | ICD-10-CM | POA: Diagnosis present

## 2021-10-15 DIAGNOSIS — Z888 Allergy status to other drugs, medicaments and biological substances status: Secondary | ICD-10-CM | POA: Diagnosis not present

## 2021-10-15 DIAGNOSIS — Z09 Encounter for follow-up examination after completed treatment for conditions other than malignant neoplasm: Secondary | ICD-10-CM

## 2021-10-15 DIAGNOSIS — F17210 Nicotine dependence, cigarettes, uncomplicated: Secondary | ICD-10-CM | POA: Diagnosis present

## 2021-10-15 DIAGNOSIS — Z9889 Other specified postprocedural states: Secondary | ICD-10-CM

## 2021-10-15 DIAGNOSIS — M8580 Other specified disorders of bone density and structure, unspecified site: Secondary | ICD-10-CM | POA: Diagnosis present

## 2021-10-15 DIAGNOSIS — M069 Rheumatoid arthritis, unspecified: Secondary | ICD-10-CM | POA: Diagnosis present

## 2021-10-15 DIAGNOSIS — Z833 Family history of diabetes mellitus: Secondary | ICD-10-CM | POA: Diagnosis not present

## 2021-10-15 DIAGNOSIS — E785 Hyperlipidemia, unspecified: Secondary | ICD-10-CM | POA: Diagnosis present

## 2021-10-15 HISTORY — PX: INTERCOSTAL NERVE BLOCK: SHX5021

## 2021-10-15 HISTORY — PX: LUNG BIOPSY: SHX5088

## 2021-10-15 LAB — POCT I-STAT 7, (LYTES, BLD GAS, ICA,H+H)
Acid-Base Excess: 0 mmol/L (ref 0.0–2.0)
Bicarbonate: 27.5 mmol/L (ref 20.0–28.0)
Calcium, Ion: 1.08 mmol/L — ABNORMAL LOW (ref 1.15–1.40)
HCT: 37 % (ref 36.0–46.0)
Hemoglobin: 12.6 g/dL (ref 12.0–15.0)
O2 Saturation: 88 %
Patient temperature: 35
Potassium: 2.9 mmol/L — ABNORMAL LOW (ref 3.5–5.1)
Sodium: 129 mmol/L — ABNORMAL LOW (ref 135–145)
TCO2: 29 mmol/L (ref 22–32)
pCO2 arterial: 49.2 mmHg — ABNORMAL HIGH (ref 32.0–48.0)
pH, Arterial: 7.346 — ABNORMAL LOW (ref 7.350–7.450)
pO2, Arterial: 53 mmHg — ABNORMAL LOW (ref 83.0–108.0)

## 2021-10-15 LAB — POCT I-STAT, CHEM 8
BUN: 8 mg/dL (ref 6–20)
Calcium, Ion: 1.02 mmol/L — ABNORMAL LOW (ref 1.15–1.40)
Chloride: 88 mmol/L — ABNORMAL LOW (ref 98–111)
Creatinine, Ser: 0.8 mg/dL (ref 0.44–1.00)
Glucose, Bld: 92 mg/dL (ref 70–99)
HCT: 44 % (ref 36.0–46.0)
Hemoglobin: 15 g/dL (ref 12.0–15.0)
Potassium: 4 mmol/L (ref 3.5–5.1)
Sodium: 126 mmol/L — ABNORMAL LOW (ref 135–145)
TCO2: 30 mmol/L (ref 22–32)

## 2021-10-15 LAB — GLUCOSE, CAPILLARY
Glucose-Capillary: 119 mg/dL — ABNORMAL HIGH (ref 70–99)
Glucose-Capillary: 142 mg/dL — ABNORMAL HIGH (ref 70–99)
Glucose-Capillary: 160 mg/dL — ABNORMAL HIGH (ref 70–99)
Glucose-Capillary: 143 mg/dL — ABNORMAL HIGH (ref 70–99)

## 2021-10-15 LAB — ABO/RH: ABO/RH(D): A POS

## 2021-10-15 SURGERY — THORACOSCOPY, ROBOT-ASSISTED
Anesthesia: General | Site: Chest | Laterality: Right

## 2021-10-15 MED ORDER — GABAPENTIN 800 MG PO TABS
800.0000 mg | ORAL_TABLET | Freq: Three times a day (TID) | ORAL | Status: DC
  Filled 2021-10-15 (×2): qty 1

## 2021-10-15 MED ORDER — PHENYLEPHRINE 40 MCG/ML (10ML) SYRINGE FOR IV PUSH (FOR BLOOD PRESSURE SUPPORT)
PREFILLED_SYRINGE | INTRAVENOUS | Status: AC
  Filled 2021-10-15: qty 10

## 2021-10-15 MED ORDER — LIDOCAINE 2% (20 MG/ML) 5 ML SYRINGE
INTRAMUSCULAR | Status: DC | PRN
  Administered 2021-10-15: 50 mg via INTRAVENOUS

## 2021-10-15 MED ORDER — ALBUTEROL SULFATE (2.5 MG/3ML) 0.083% IN NEBU: 2.5000 mg | INHALATION_SOLUTION | Freq: Four times a day (QID) | RESPIRATORY_TRACT | Status: DC | PRN

## 2021-10-15 MED ORDER — ACETAMINOPHEN 500 MG PO TABS
1000.0000 mg | ORAL_TABLET | Freq: Four times a day (QID) | ORAL | Status: DC
  Administered 2021-10-15 – 2021-10-18 (×9): 1000 mg via ORAL
  Filled 2021-10-15 (×10): qty 2

## 2021-10-15 MED ORDER — PHENYLEPHRINE HCL-NACL 20-0.9 MG/250ML-% IV SOLN
INTRAVENOUS | Status: DC | PRN
  Administered 2021-10-15: 50 ug/min via INTRAVENOUS

## 2021-10-15 MED ORDER — CYCLOBENZAPRINE HCL 10 MG PO TABS: 10.0000 mg | ORAL_TABLET | Freq: Two times a day (BID) | ORAL | Status: DC | PRN

## 2021-10-15 MED ORDER — ALBUTEROL SULFATE HFA 108 (90 BASE) MCG/ACT IN AERS
INHALATION_SPRAY | RESPIRATORY_TRACT | Status: DC | PRN
  Administered 2021-10-15: 4 via RESPIRATORY_TRACT

## 2021-10-15 MED ORDER — MIDAZOLAM HCL 2 MG/2ML IJ SOLN
INTRAMUSCULAR | Status: AC
  Filled 2021-10-15: qty 2

## 2021-10-15 MED ORDER — ACETAMINOPHEN 160 MG/5ML PO SOLN
1000.0000 mg | Freq: Four times a day (QID) | ORAL | Status: DC
  Administered 2021-10-15: 1000 mg via ORAL
  Filled 2021-10-15: qty 40.6

## 2021-10-15 MED ORDER — PROPOFOL 10 MG/ML IV BOLUS
INTRAVENOUS | Status: DC | PRN
  Administered 2021-10-15: 50 mg via INTRAVENOUS
  Administered 2021-10-15: 150 mg via INTRAVENOUS

## 2021-10-15 MED ORDER — ONDANSETRON HCL 4 MG/2ML IJ SOLN: 4.0000 mg | Freq: Four times a day (QID) | INTRAMUSCULAR | Status: DC | PRN

## 2021-10-15 MED ORDER — SODIUM CHLORIDE 0.45 % IV SOLN: INTRAVENOUS | Status: DC

## 2021-10-15 MED ORDER — OXYCODONE HCL 5 MG PO TABS: 5.0000 mg | ORAL_TABLET | Freq: Once | ORAL | Status: DC | PRN

## 2021-10-15 MED ORDER — DEXAMETHASONE SODIUM PHOSPHATE 10 MG/ML IJ SOLN
INTRAMUSCULAR | Status: AC
  Filled 2021-10-15: qty 1

## 2021-10-15 MED ORDER — GABAPENTIN 400 MG PO CAPS
800.0000 mg | ORAL_CAPSULE | Freq: Three times a day (TID) | ORAL | Status: DC
  Administered 2021-10-15 – 2021-10-18 (×9): 800 mg via ORAL
  Filled 2021-10-15 (×9): qty 2

## 2021-10-15 MED ORDER — SODIUM CHLORIDE 0.9 % IV SOLN
INTRAVENOUS | Status: AC | PRN
  Administered 2021-10-15: 1000 mL

## 2021-10-15 MED ORDER — ENOXAPARIN SODIUM 40 MG/0.4ML IJ SOSY
40.0000 mg | PREFILLED_SYRINGE | Freq: Every day | INTRAMUSCULAR | Status: DC
  Administered 2021-10-16 – 2021-10-17 (×2): 40 mg via SUBCUTANEOUS
  Filled 2021-10-15 (×3): qty 0.4

## 2021-10-15 MED ORDER — LIDOCAINE 2% (20 MG/ML) 5 ML SYRINGE
INTRAMUSCULAR | Status: AC
  Filled 2021-10-15: qty 5

## 2021-10-15 MED ORDER — CEFAZOLIN SODIUM-DEXTROSE 2-4 GM/100ML-% IV SOLN
2.0000 g | Freq: Three times a day (TID) | INTRAVENOUS | Status: AC
  Administered 2021-10-15 (×2): 2 g via INTRAVENOUS
  Filled 2021-10-15 (×2): qty 100

## 2021-10-15 MED ORDER — ALBUTEROL SULFATE HFA 108 (90 BASE) MCG/ACT IN AERS: 2.0000 | INHALATION_SPRAY | Freq: Four times a day (QID) | RESPIRATORY_TRACT | Status: DC | PRN

## 2021-10-15 MED ORDER — SENNOSIDES-DOCUSATE SODIUM 8.6-50 MG PO TABS
1.0000 | ORAL_TABLET | Freq: Every day | ORAL | Status: DC
  Administered 2021-10-15 – 2021-10-17 (×3): 1 via ORAL
  Filled 2021-10-15 (×3): qty 1

## 2021-10-15 MED ORDER — SULFASALAZINE 500 MG PO TABS
500.0000 mg | ORAL_TABLET | Freq: Two times a day (BID) | ORAL | Status: DC
  Administered 2021-10-15 – 2021-10-18 (×6): 500 mg via ORAL
  Filled 2021-10-15 (×7): qty 1

## 2021-10-15 MED ORDER — LACTATED RINGERS IV SOLN: INTRAVENOUS | Status: DC

## 2021-10-15 MED ORDER — ROSUVASTATIN CALCIUM 5 MG PO TABS
10.0000 mg | ORAL_TABLET | Freq: Every day | ORAL | Status: DC
  Administered 2021-10-15 – 2021-10-17 (×3): 10 mg via ORAL
  Filled 2021-10-15 (×3): qty 2

## 2021-10-15 MED ORDER — SUGAMMADEX SODIUM 200 MG/2ML IV SOLN
INTRAVENOUS | Status: DC | PRN
  Administered 2021-10-15: 400 mg via INTRAVENOUS

## 2021-10-15 MED ORDER — FENTANYL CITRATE (PF) 100 MCG/2ML IJ SOLN
INTRAMUSCULAR | Status: AC
  Filled 2021-10-15: qty 2

## 2021-10-15 MED ORDER — INSULIN ASPART 100 UNIT/ML IJ SOLN: 0.0000 [IU] | INTRAMUSCULAR | Status: DC

## 2021-10-15 MED ORDER — SODIUM CHLORIDE FLUSH 0.9 % IV SOLN
INTRAVENOUS | Status: DC | PRN
  Administered 2021-10-15: 100 mL

## 2021-10-15 MED ORDER — PRAZOSIN HCL 2 MG PO CAPS
5.0000 mg | ORAL_CAPSULE | Freq: Every day | ORAL | Status: DC
  Administered 2021-10-15 – 2021-10-17 (×3): 5 mg via ORAL
  Filled 2021-10-15 (×3): qty 1

## 2021-10-15 MED ORDER — RISPERIDONE 0.25 MG PO TABS
0.2500 mg | ORAL_TABLET | Freq: Two times a day (BID) | ORAL | Status: DC
  Administered 2021-10-15 – 2021-10-18 (×6): 0.25 mg via ORAL
  Filled 2021-10-15 (×6): qty 1

## 2021-10-15 MED ORDER — MIDAZOLAM HCL 2 MG/2ML IJ SOLN
INTRAMUSCULAR | Status: DC | PRN
  Administered 2021-10-15: 2 mg via INTRAVENOUS

## 2021-10-15 MED ORDER — FENTANYL CITRATE (PF) 100 MCG/2ML IJ SOLN
25.0000 ug | INTRAMUSCULAR | Status: DC | PRN
  Administered 2021-10-15 (×4): 25 ug via INTRAVENOUS

## 2021-10-15 MED ORDER — CEFAZOLIN SODIUM-DEXTROSE 2-4 GM/100ML-% IV SOLN
2.0000 g | INTRAVENOUS | Status: AC
  Administered 2021-10-15: 2 g via INTRAVENOUS
  Filled 2021-10-15: qty 100

## 2021-10-15 MED ORDER — FENTANYL CITRATE (PF) 250 MCG/5ML IJ SOLN
INTRAMUSCULAR | Status: AC
  Filled 2021-10-15: qty 5

## 2021-10-15 MED ORDER — PROPOFOL 10 MG/ML IV BOLUS
INTRAVENOUS | Status: AC
  Filled 2021-10-15: qty 20

## 2021-10-15 MED ORDER — ROCURONIUM BROMIDE 10 MG/ML (PF) SYRINGE
PREFILLED_SYRINGE | INTRAVENOUS | Status: AC
  Filled 2021-10-15: qty 20

## 2021-10-15 MED ORDER — PANTOPRAZOLE SODIUM 40 MG PO TBEC
40.0000 mg | DELAYED_RELEASE_TABLET | Freq: Every day | ORAL | Status: DC
  Administered 2021-10-16 – 2021-10-18 (×3): 40 mg via ORAL
  Filled 2021-10-15 (×3): qty 1

## 2021-10-15 MED ORDER — ALBUTEROL SULFATE (2.5 MG/3ML) 0.083% IN NEBU
INHALATION_SOLUTION | RESPIRATORY_TRACT | Status: AC
  Administered 2021-10-15: 2.5 mg
  Filled 2021-10-15: qty 3

## 2021-10-15 MED ORDER — FENTANYL CITRATE (PF) 250 MCG/5ML IJ SOLN
INTRAMUSCULAR | Status: DC | PRN
  Administered 2021-10-15: 100 ug via INTRAVENOUS
  Administered 2021-10-15 (×2): 50 ug via INTRAVENOUS
  Administered 2021-10-15: 100 ug via INTRAVENOUS

## 2021-10-15 MED ORDER — CHLORHEXIDINE GLUCONATE 0.12 % MT SOLN
15.0000 mL | Freq: Once | OROMUCOSAL | Status: AC
  Administered 2021-10-15: 15 mL via OROMUCOSAL
  Filled 2021-10-15: qty 15

## 2021-10-15 MED ORDER — ORAL CARE MOUTH RINSE: 15.0000 mL | Freq: Once | OROMUCOSAL | Status: AC

## 2021-10-15 MED ORDER — HYOSCYAMINE SULFATE 0.125 MG SL SUBL
0.1250 mg | SUBLINGUAL_TABLET | Freq: Two times a day (BID) | SUBLINGUAL | Status: DC | PRN
  Filled 2021-10-15: qty 1

## 2021-10-15 MED ORDER — ALBUMIN HUMAN 5 % IV SOLN: INTRAVENOUS | Status: DC | PRN

## 2021-10-15 MED ORDER — ROCURONIUM BROMIDE 10 MG/ML (PF) SYRINGE
PREFILLED_SYRINGE | INTRAVENOUS | Status: DC | PRN
  Administered 2021-10-15: 30 mg via INTRAVENOUS
  Administered 2021-10-15: 50 mg via INTRAVENOUS
  Administered 2021-10-15: 40 mg via INTRAVENOUS
  Administered 2021-10-15: 30 mg via INTRAVENOUS
  Administered 2021-10-15: 60 mg via INTRAVENOUS

## 2021-10-15 MED ORDER — ONDANSETRON HCL 4 MG/2ML IJ SOLN
INTRAMUSCULAR | Status: DC | PRN
  Administered 2021-10-15: 4 mg via INTRAVENOUS

## 2021-10-15 MED ORDER — OXYCODONE HCL 5 MG/5ML PO SOLN: 5.0000 mg | Freq: Once | ORAL | Status: DC | PRN

## 2021-10-15 MED ORDER — LACTATED RINGERS IV SOLN: INTRAVENOUS | Status: DC | PRN

## 2021-10-15 MED ORDER — FLUVOXAMINE MALEATE 100 MG PO TABS
100.0000 mg | ORAL_TABLET | Freq: Two times a day (BID) | ORAL | Status: DC
  Administered 2021-10-15 – 2021-10-18 (×6): 100 mg via ORAL
  Filled 2021-10-15 (×6): qty 1

## 2021-10-15 MED ORDER — DEXAMETHASONE SODIUM PHOSPHATE 10 MG/ML IJ SOLN
INTRAMUSCULAR | Status: DC | PRN
  Administered 2021-10-15: 10 mg via INTRAVENOUS

## 2021-10-15 MED ORDER — ONDANSETRON HCL 4 MG/2ML IJ SOLN
INTRAMUSCULAR | Status: AC
  Filled 2021-10-15: qty 2

## 2021-10-15 MED ORDER — METOCLOPRAMIDE HCL 5 MG/ML IJ SOLN
10.0000 mg | Freq: Four times a day (QID) | INTRAMUSCULAR | Status: AC
  Administered 2021-10-15 – 2021-10-16 (×3): 10 mg via INTRAVENOUS
  Filled 2021-10-15 (×4): qty 2

## 2021-10-15 MED ORDER — INSULIN ASPART 100 UNIT/ML IJ SOLN
0.0000 [IU] | INTRAMUSCULAR | Status: DC
  Administered 2021-10-15 – 2021-10-16 (×3): 2 [IU] via SUBCUTANEOUS

## 2021-10-15 MED ORDER — BISACODYL 5 MG PO TBEC
10.0000 mg | DELAYED_RELEASE_TABLET | Freq: Every day | ORAL | Status: DC
  Administered 2021-10-16 – 2021-10-18 (×3): 10 mg via ORAL
  Filled 2021-10-15 (×3): qty 2

## 2021-10-15 MED ORDER — ALBUTEROL SULFATE (2.5 MG/3ML) 0.083% IN NEBU: 2.5000 mg | INHALATION_SOLUTION | Freq: Once | RESPIRATORY_TRACT | Status: AC

## 2021-10-15 MED ORDER — HEMOSTATIC AGENTS (NO CHARGE) OPTIME
TOPICAL | Status: DC | PRN
  Administered 2021-10-15: 1 via TOPICAL

## 2021-10-15 MED ORDER — FENTANYL CITRATE PF 50 MCG/ML IJ SOSY
25.0000 ug | PREFILLED_SYRINGE | INTRAMUSCULAR | Status: DC | PRN
  Administered 2021-10-15 – 2021-10-16 (×2): 50 ug via INTRAVENOUS
  Filled 2021-10-15 (×2): qty 1

## 2021-10-15 MED ORDER — ZOLPIDEM TARTRATE 5 MG PO TABS
5.0000 mg | ORAL_TABLET | Freq: Every day | ORAL | Status: DC
  Administered 2021-10-15 – 2021-10-17 (×3): 5 mg via ORAL
  Filled 2021-10-15 (×3): qty 1

## 2021-10-15 MED ORDER — KETOROLAC TROMETHAMINE 15 MG/ML IJ SOLN
15.0000 mg | Freq: Four times a day (QID) | INTRAMUSCULAR | Status: AC
  Administered 2021-10-15 – 2021-10-17 (×7): 15 mg via INTRAVENOUS
  Filled 2021-10-15 (×8): qty 1

## 2021-10-15 MED ORDER — POTASSIUM CHLORIDE 10 MEQ/100ML IV SOLN
10.0000 meq | INTRAVENOUS | Status: DC
  Administered 2021-10-15: 10 meq via INTRAVENOUS
  Filled 2021-10-15 (×2): qty 100

## 2021-10-15 MED ORDER — PHENYLEPHRINE 40 MCG/ML (10ML) SYRINGE FOR IV PUSH (FOR BLOOD PRESSURE SUPPORT)
PREFILLED_SYRINGE | INTRAVENOUS | Status: DC | PRN
  Administered 2021-10-15 (×2): 80 ug via INTRAVENOUS
  Administered 2021-10-15: 40 ug via INTRAVENOUS

## 2021-10-15 MED ORDER — 0.9 % SODIUM CHLORIDE (POUR BTL) OPTIME
TOPICAL | Status: DC | PRN
  Administered 2021-10-15: 2000 mL

## 2021-10-15 MED ORDER — CHLORHEXIDINE GLUCONATE CLOTH 2 % EX PADS
6.0000 | MEDICATED_PAD | Freq: Every day | CUTANEOUS | Status: DC
  Administered 2021-10-16 – 2021-10-17 (×2): 6 via TOPICAL

## 2021-10-15 MED ORDER — OXCARBAZEPINE 300 MG PO TABS
300.0000 mg | ORAL_TABLET | Freq: Three times a day (TID) | ORAL | Status: DC
  Administered 2021-10-15 – 2021-10-18 (×9): 300 mg via ORAL
  Filled 2021-10-15 (×9): qty 1

## 2021-10-15 MED ORDER — OXYCODONE HCL 5 MG PO TABS
10.0000 mg | ORAL_TABLET | ORAL | Status: DC | PRN
  Administered 2021-10-15 – 2021-10-16 (×2): 10 mg via ORAL
  Administered 2021-10-16: 15 mg via ORAL
  Administered 2021-10-16 (×2): 10 mg via ORAL
  Administered 2021-10-17: 15 mg via ORAL
  Administered 2021-10-17: 10 mg via ORAL
  Filled 2021-10-15: qty 2
  Filled 2021-10-15 (×3): qty 3
  Filled 2021-10-15 (×3): qty 2

## 2021-10-15 MED ORDER — ALBUTEROL SULFATE HFA 108 (90 BASE) MCG/ACT IN AERS
INHALATION_SPRAY | RESPIRATORY_TRACT | Status: AC
  Filled 2021-10-15: qty 6.7

## 2021-10-15 SURGICAL SUPPLY — 107 items
APPLIER CLIP ROT 10 11.4 M/L (STAPLE)
BLADE CLIPPER SURG (BLADE) ×2 IMPLANT
CANISTER SUCT 3000ML PPV (MISCELLANEOUS) ×2 IMPLANT
CATH THORACIC 28FR (CATHETERS) IMPLANT
CATH THORACIC 28FR RT ANG (CATHETERS) IMPLANT
CATH THORACIC 36FR (CATHETERS) IMPLANT
CATH THORACIC 36FR RT ANG (CATHETERS) IMPLANT
CLIP APPLIE ROT 10 11.4 M/L (STAPLE) IMPLANT
CLIP VESOCCLUDE MED 6/CT (CLIP) IMPLANT
CNTNR URN SCR LID CUP LEK RST (MISCELLANEOUS) ×5 IMPLANT
CONN ST 1/4X3/8  BEN (MISCELLANEOUS) ×1
CONN ST 1/4X3/8 BEN (MISCELLANEOUS) ×1 IMPLANT
CONN Y 3/8X3/8X3/8  BEN (MISCELLANEOUS)
CONN Y 3/8X3/8X3/8 BEN (MISCELLANEOUS) IMPLANT
CONT SPEC 4OZ STRL OR WHT (MISCELLANEOUS) ×5
DEFOGGER SCOPE WARMER CLEARIFY (MISCELLANEOUS) ×2 IMPLANT
DERMABOND ADVANCED (GAUZE/BANDAGES/DRESSINGS) ×1
DERMABOND ADVANCED .7 DNX12 (GAUZE/BANDAGES/DRESSINGS) ×1 IMPLANT
DRAIN CHANNEL 28F RND 3/8 FF (WOUND CARE) ×2 IMPLANT
DRAIN CHANNEL 32F RND 10.7 FF (WOUND CARE) IMPLANT
DRAPE ARM DVNC X/XI (DISPOSABLE) ×4 IMPLANT
DRAPE COLUMN DVNC XI (DISPOSABLE) ×1 IMPLANT
DRAPE CV SPLIT W-CLR ANES SCRN (DRAPES) ×2 IMPLANT
DRAPE DA VINCI XI ARM (DISPOSABLE) ×4
DRAPE DA VINCI XI COLUMN (DISPOSABLE) ×1
DRAPE INCISE IOBAN 66X45 STRL (DRAPES) IMPLANT
DRAPE ORTHO SPLIT 77X108 STRL (DRAPES) ×1
DRAPE SURG ORHT 6 SPLT 77X108 (DRAPES) ×1 IMPLANT
ELECT BLADE 6.5 EXT (BLADE) IMPLANT
ELECT REM PT RETURN 9FT ADLT (ELECTROSURGICAL) ×2
ELECTRODE REM PT RTRN 9FT ADLT (ELECTROSURGICAL) ×1 IMPLANT
GAUZE SPONGE 4X4 12PLY STRL (GAUZE/BANDAGES/DRESSINGS) ×2 IMPLANT
GLOVE SURG MICRO LTX SZ7.5 (GLOVE) ×4 IMPLANT
GOWN STRL REUS W/ TWL LRG LVL3 (GOWN DISPOSABLE) ×1 IMPLANT
GOWN STRL REUS W/ TWL XL LVL3 (GOWN DISPOSABLE) ×3 IMPLANT
GOWN STRL REUS W/TWL 2XL LVL3 (GOWN DISPOSABLE) ×4 IMPLANT
GOWN STRL REUS W/TWL LRG LVL3 (GOWN DISPOSABLE) ×1
GOWN STRL REUS W/TWL XL LVL3 (GOWN DISPOSABLE) ×3
HEMOSTAT SURGICEL 2X14 (HEMOSTASIS) IMPLANT
IRRIGATION STRYKERFLOW (MISCELLANEOUS) ×1 IMPLANT
IRRIGATOR STRYKERFLOW (MISCELLANEOUS) ×2
IRRIGATOR SUCT 8 DISP DVNC XI (IRRIGATION / IRRIGATOR) IMPLANT
IRRIGATOR SUCTION 8MM XI DISP (IRRIGATION / IRRIGATOR)
KIT BASIN OR (CUSTOM PROCEDURE TRAY) ×2 IMPLANT
KIT SUCTION CATH 14FR (SUCTIONS) IMPLANT
KIT TURNOVER KIT B (KITS) ×2 IMPLANT
NEEDLE HYPO 25GX1X1/2 BEV (NEEDLE) ×2 IMPLANT
NEEDLE SPNL 18GX3.5 QUINCKE PK (NEEDLE) ×2 IMPLANT
NS IRRIG 1000ML POUR BTL (IV SOLUTION) ×4 IMPLANT
PACK CHEST (CUSTOM PROCEDURE TRAY) ×2 IMPLANT
PAD ARMBOARD 7.5X6 YLW CONV (MISCELLANEOUS) ×4 IMPLANT
PROGEL SPRAY TIP 11IN (MISCELLANEOUS) ×2
RELOAD STAPLER 3.5X45 BLU DVNC (STAPLE) ×3 IMPLANT
RELOAD STAPLER 4.3X45 GRN DVNC (STAPLE) ×5 IMPLANT
RELOAD STAPLER 45 4.6 BLK DVNC (STAPLE) ×4 IMPLANT
SCISSORS LAP 5X35 DISP (ENDOMECHANICALS) IMPLANT
SEAL CANN UNIV 5-8 DVNC XI (MISCELLANEOUS) ×3 IMPLANT
SEAL XI 5MM-8MM UNIVERSAL (MISCELLANEOUS) ×3
SEALANT PROGEL (MISCELLANEOUS) ×2 IMPLANT
SET TRI-LUMEN FLTR TB AIRSEAL (TUBING) ×2 IMPLANT
SET TUBE SMOKE EVAC HIGH FLOW (TUBING) IMPLANT
SHEARS HARMONIC HDI 20CM (ELECTROSURGICAL) IMPLANT
SPECIMEN JAR MEDIUM (MISCELLANEOUS) IMPLANT
SPONGE INTESTINAL PEANUT (DISPOSABLE) IMPLANT
SPONGE TONSIL TAPE 1 RFD (DISPOSABLE) IMPLANT
STAPLE RELOAD 45 GRN (STAPLE) IMPLANT
STAPLE RELOAD 45MM GREEN (STAPLE)
STAPLER 45 DA VINCI SURE FORM (STAPLE) ×1
STAPLER 45 SUREFORM DVNC (STAPLE) ×1 IMPLANT
STAPLER RELOAD 3.5X45 BLU DVNC (STAPLE) ×3
STAPLER RELOAD 3.5X45 BLUE (STAPLE) ×3
STAPLER RELOAD 4.3X45 GREEN (STAPLE) ×5
STAPLER RELOAD 4.3X45 GRN DVNC (STAPLE) ×5
STAPLER RELOAD 45 4.6 BLK (STAPLE) ×4
STAPLER RELOAD 45 4.6 BLK DVNC (STAPLE) ×4
SUT PDS AB 3-0 SH 27 (SUTURE) IMPLANT
SUT PROLENE 4 0 RB 1 (SUTURE)
SUT PROLENE 4-0 RB1 .5 CRCL 36 (SUTURE) IMPLANT
SUT SILK  1 MH (SUTURE) ×1
SUT SILK 1 MH (SUTURE) ×1 IMPLANT
SUT SILK 1 TIES 10X30 (SUTURE) IMPLANT
SUT SILK 2 0 SH (SUTURE) IMPLANT
SUT SILK 2 0SH CR/8 30 (SUTURE) IMPLANT
SUT SILK 3 0 SH 30 (SUTURE) IMPLANT
SUT SILK 3 0SH CR/8 30 (SUTURE) IMPLANT
SUT VIC AB 1 CTX 36 (SUTURE)
SUT VIC AB 1 CTX36XBRD ANBCTR (SUTURE) IMPLANT
SUT VIC AB 2-0 CTX 36 (SUTURE) IMPLANT
SUT VIC AB 3-0 MH 27 (SUTURE) IMPLANT
SUT VIC AB 3-0 SH 27 (SUTURE) ×4
SUT VIC AB 3-0 SH 27X BRD (SUTURE) ×4 IMPLANT
SUT VIC AB 3-0 X1 27 (SUTURE) ×4 IMPLANT
SUT VICRYL 0 TIES 12 18 (SUTURE) ×2 IMPLANT
SUT VICRYL 0 UR6 27IN ABS (SUTURE) ×6 IMPLANT
SUT VICRYL 2 TP 1 (SUTURE) IMPLANT
SYR 30ML LL (SYRINGE) ×2 IMPLANT
SYSTEM RETRIEVAL ANCHOR 8 (MISCELLANEOUS) ×2 IMPLANT
SYSTEM SAHARA CHEST DRAIN ATS (WOUND CARE) ×2 IMPLANT
TAPE CLOTH 4X10 WHT NS (GAUZE/BANDAGES/DRESSINGS) ×2 IMPLANT
TAPE CLOTH SURG 4X10 WHT LF (GAUZE/BANDAGES/DRESSINGS) ×2 IMPLANT
TIP APPLICATOR SPRAY EXTEND 16 (VASCULAR PRODUCTS) IMPLANT
TIP SPRAY PROGEL 11IN (MISCELLANEOUS) ×1 IMPLANT
TOWEL GREEN STERILE (TOWEL DISPOSABLE) ×2 IMPLANT
TRAY FOLEY MTR SLVR 14FR STAT (SET/KITS/TRAYS/PACK) ×2 IMPLANT
TRAY FOLEY MTR SLVR 16FR STAT (SET/KITS/TRAYS/PACK) IMPLANT
TROCAR PORT AIRSEAL 12X150 (TUBING) ×2 IMPLANT
WATER STERILE IRR 1000ML POUR (IV SOLUTION) ×2 IMPLANT

## 2021-10-15 NOTE — Anesthesia Procedure Notes (Addendum)
Arterial Line Insertion Start/End12/07/2021 7:00 AM, 10/15/2021 7:35 AM Performed by: CRNA  Patient location: Pre-op. Preanesthetic checklist: patient identified, IV checked, site marked, risks and benefits discussed, surgical consent, monitors and equipment checked, pre-op evaluation, timeout performed and anesthesia consent Lidocaine 1% used for infiltration and patient sedated Left, radial was placed Catheter size: 20 G Hand hygiene performed  and maximum sterile barriers used   Attempts: 3 Procedure performed without using ultrasound guided technique. Following insertion, Biopatch and dressing applied. Patient tolerated the procedure well with no immediate complications.

## 2021-10-15 NOTE — Telephone Encounter (Signed)
Pt is currently at the hospital after having surgery performed by Dr. Roxan Hockey today 10/15/21. Will call pt Monday, 12/12 to further discuss ONO results.

## 2021-10-15 NOTE — Progress Notes (Signed)
Admission from PACU by bed awake and alert. 

## 2021-10-15 NOTE — Hospital Course (Addendum)
History of Present Illness:  Lacey Erickson is a 45 year old woman with a history of tobacco abuse, rheumatoid arthritis, bipolar disorder, fibromyalgia, hyperlipidemia, reflux, cervical cancer, anxiety, and depression.  She has been having issues with shortness of breath for about a year and a half now.  She currently gets short of breath even with walking from room to room in her house.  She continues to smoke.  She saw Dr. Chase Caller. A high resolution CT showed ILD not consistent with UIP.  After discussion they decided to pursue surgical lung biopsy.  She was evaluated by Dr. Roxan Hockey who was agreeable to perform the lung biopsy.  The risks and benefits of the procedure were explained to the patient and she was agreeable to proceed.  Hospital course:  [name] presented to [facility] on 10/15/2021.  She was taken to the operating room and underwent Robotic Assisted Right Video Assisted Thoracoscopy with biopsy of right upper, middle, and lower lobe.  She tolerated the procedure without difficulty, was extubated, and taken to the PACU in stable condition.  She was later transferred to [facility] progressive care.  Respiratory status remained stable.  On the first postoperative day she was noted to be without any air leak.  She had moderate serosanguineous drainage so the chest tube was left to waterseal.  On postop day 2, chest tube was removed and follow-up chest x-ray on that same day showed unchanged trace apical right pneumothorax.  [name] was mobilized routinely as she was able.  She was diuresed for expected perioperative volume overload.  Oxygen was weaned to 2L via [place] which is her home regimen.  She was hypertensive and was started on Norvasc.  She will need close follow up with PCP to ensure adequate BP control.  Her surgical incisions are healing without evidence of infection.  She is ambulating and felt medically stable for discharge home today.

## 2021-10-15 NOTE — Discharge Summary (Signed)
Physician Discharge Summary  Patient ID: Lacey Erickson MRN: [id number] DOB/AGE: Jun 18, 1976 45 y.o.  Admit date: 10/15/2021 Discharge date: 10/18/2021  Admission Diagnoses:  Patient Active Problem List   Diagnosis Date Noted   Rheumatoid arthritis ([place]) 09/03/2020   Gastroesophageal reflux disease 09/03/2020   Dyspnea on exertion 09/03/2020   ILD (interstitial lung disease) ([place]) 09/03/2020   Cough 09/03/2020   Allergic rhinitis 09/03/2020   History of nasal polypectomy 09/03/2020   Smoker 09/03/2020   Healthcare maintenance 09/03/2020   At risk for obstructive sleep apnea 09/03/2020   Discharge Diagnoses:   Patient Active Problem List   Diagnosis Date Noted   History of lung biopsy 10/15/2021   Rheumatoid arthritis ([place]) 09/03/2020   Gastroesophageal reflux disease 09/03/2020   Dyspnea on exertion 09/03/2020   ILD (interstitial lung disease) ([place]) 09/03/2020   Cough 09/03/2020   Allergic rhinitis 09/03/2020   History of nasal polypectomy 09/03/2020   Smoker 09/03/2020   Healthcare maintenance 09/03/2020   At risk for obstructive sleep apnea 09/03/2020   Discharged Condition: good  History of Present Illness:  Lacey Erickson is a 45 year old woman with a history of tobacco abuse, rheumatoid arthritis, bipolar disorder, fibromyalgia, hyperlipidemia, reflux, cervical cancer, anxiety, and depression.  She has been having issues with shortness of breath for about a year and a half now.  She currently gets short of breath even with walking from room to room in her house.  She continues to smoke.  She saw Dr. Chase Caller. A high resolution CT showed ILD not consistent with UIP.  After discussion they decided to pursue surgical lung biopsy.  She was evaluated by Dr. Roxan Hockey who was agreeable to perform the lung biopsy.  The risks and benefits of the procedure were explained to the patient and she was agreeable to proceed.  Hospital course:  Lacey Erickson presented to [facility] on 10/15/2021.  She was taken to the operating room and underwent Robotic Assisted Right Video Assisted Thoracoscopy with biopsy of right upper, middle, and lower lobe.  She tolerated the procedure without difficulty, was extubated, and taken to the PACU in stable condition.  She was later transferred to [facility] progressive care.  Respiratory status remained stable.  On the first postoperative day she was noted to be without any air leak.  She had moderate serosanguineous drainage so the chest tube was left to waterseal.  On postop day 2, chest tube was removed and follow-up chest x-ray on that same day showed unchanged trace apical right pneumothorax.  Lacey Erickson was mobilized routinely as she was able.  She was diuresed for expected perioperative volume overload.  Oxygen was weaned to 2L via [place] which is her home regimen.  She was hypertensive and was started on Norvasc.  She will need close follow up with PCP to ensure adequate BP control.  Her surgical incisions are healing without evidence of infection.  She is ambulating and felt medically stable for discharge home today.  Consults: None  Significant Diagnostic Studies: radiology: CT scan:    1. There are findings in the lungs concerning for interstitial lung disease, with a spectrum considered most compatible with an alternative diagnosis (not usual interstitial pneumonia). Specifically, findings are concerning for potential disclaimer of interstitial pneumonia (DIP). Repeat high-resolution chest CT is recommended in 12 months to assess for temporal changes in the appearance of the lung parenchyma. 2. There is also diffuse bronchial wall thickening with moderate centrilobular and paraseptal emphysema; imaging findings suggestive of underlying COPD.  3. Mild dilatation of the pulmonic trunk (3.5 cm in diameter), concerning for associated pulmonary arterial hypertension. 4. Small pulmonary nodules in the lungs measuring up to 5 mm in  the right upper lobe (axial image 40 of series 9). These are nonspecific, but attention at time of repeat high-resolution chest CT is recommended to ensure stability. This recommendation follows the consensus statement: Guidelines for Management of Incidental Pulmonary Nodules Detected on CT Images: From the Fleischner Society 2017; Radiology 2017; 284:228-243. 5. Hepatic steatosis. 6. Aortic atherosclerosis. 7. 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   Aortic Atherosclerosis (ICD10-I70.0) and Emphysema (ICD10-J43.9).     Electronically Signed   By: Vinnie Langton M.D.   On: 05/12/2021 07:26  Treatments: surgery:   Operative Report    DATE OF PROCEDURE: 10/15/2021   PREOPERATIVE DIAGNOSIS:  Interstitial lung disease.   POSTOPERATIVE DIAGNOSIS:  Interstitial lung disease.   PROCEDURE:  Robotic right VATS for lung biopsy.  Intercostal nerve blocks levels 3 through 10.   SURGEON:  Revonda Standard. Roxan Hockey, MD   ASSISTANT:  Ellwood Handler PA-C  PATHOLOGY: Remains Pending  Discharge Exam: Blood pressure (!) 165/88, pulse 85, temperature 98.3 F (36.8 C), resp. rate 13, height 5\' 2"  (1.575 m), weight 103.4 kg, SpO2 93 %.  General appearance: alert, cooperative, and no distress Heart: regular rate and rhythm Lungs: CTA mildly diminshed bibasilar Abdomen: soft, non-tender; bowel sounds normal; no masses,  no organomegaly Extremities: extremities normal, atraumatic, no cyanosis or edema Wound: clean and dry  Discharge disposition: 01-Home or Self Care  Allergies as of 10/18/2021       Reactions   Adalimumab Other (See Comments)   Cervical Cancer Humira   Baclofen Other (See Comments)   Made pt not feel herself   Etanercept Other (See Comments)   Enbrel - pt does not recall reaction    Plaquenil [hydroxychloroquine]    alopecia   Topiramate Other (See Comments)   Numbness and tingling    Armodafinil Palpitations        Medication List     TAKE these medications    albuterol 108 (90 Base) MCG/ACT inhaler Commonly known as: VENTOLIN HFA Inhale 2 puffs into the lungs every 6 (six) hours as needed for wheezing or shortness of breath.   amLODipine 5 MG tablet Commonly known as: NORVASC Take 1 tablet (5 mg total) by mouth daily.   cyclobenzaprine 10 MG tablet Commonly known as: FLEXERIL Take 10 mg by mouth 2 (two) times daily as needed for muscle spasms.   Eszopiclone 3 MG Tabs Take 3 mg by mouth at bedtime. Take immediately before bedtime   fluvoxaMINE 100 MG tablet Commonly known as: LUVOX Take 100 mg by mouth 2 (two) times daily.   furosemide 40 MG tablet Commonly known as: LASIX Take 1 tablet (40 mg total) by mouth daily. On 10/19/2021   gabapentin 800 MG tablet Commonly known as: NEURONTIN Take 800 mg by mouth 3 (three) times daily.   hyoscyamine 0.125 MG SL tablet Commonly known as: LEVSIN SL PLACE 1-2 TABLET UNDER THE TONGUE EVERY 4 HOURS AS NEEDED FOR PAIN   ondansetron 4 MG disintegrating tablet Commonly known as: Zofran ODT Take 1 tablet (4 mg total) by mouth every 8 (eight) hours as needed for nausea or vomiting.   Oxcarbazepine 300 MG tablet Commonly known as: TRILEPTAL Take 300 mg by mouth in the morning, at noon, and at bedtime.   oxyCODONE-acetaminophen 10-325 MG tablet Commonly  known as: PERCOCET Take 1 tablet by mouth 3 (three) times daily as needed for pain.   Ozempic (0.25 or 0.5 MG/DOSE) 2 MG/1.5ML Sopn Generic drug: Semaglutide(0.25 or 0.5MG /DOS) Inject 0.25 mg into the skin every Thursday.   pantoprazole 40 MG tablet Commonly known as: PROTONIX Take 1 tablet (40 mg total) by mouth daily.   potassium chloride SA 20 MEQ tablet Commonly known as: KLOR-CON M Take 1 tablet (20 mEq total) by mouth daily. On 10/19/2021   prazosin 5 MG capsule Commonly known as: MINIPRESS Take 5 mg by mouth at bedtime.   risperiDONE 0.25 MG  tablet Commonly known as: RISPERDAL Take 0.25 mg by mouth 2 (two) times daily.   rizatriptan 5 MG tablet Commonly known as: MAXALT Take 5 mg by mouth as needed for migraine. May repeat in 2 hours if needed   rosuvastatin 10 MG tablet Commonly known as: CRESTOR Take 10 mg by mouth at bedtime.   sulfaSALAzine 500 MG tablet Commonly known as: AZULFIDINE Take 500 mg by mouth 2 (two) times daily.        Follow-up Information     Melrose Nakayama, MD Follow up on 10/28/2021.   Specialty: Cardiothoracic Surgery Why: Your appointment is at 4 PM Please arrive 30 minutes early for a chest x-ray to be performed by [facility] located on the first floor of the same building. Contact information: [place] [address] [phone]         Maris Berger, MD Follow up.   Specialty: Family Medicine Why: call and make appointment for BP check in 1-2 weeks Contact information: [place] [address] [phone]                 Signed: Ellwood Handler, PA-C  10/18/2021, 8:20 AM

## 2021-10-15 NOTE — Brief Op Note (Signed)
10/15/2021  10:37 AM  PATIENT:  Lacey Erickson  45 y.o. female  PRE-OPERATIVE DIAGNOSIS:  Interstitial Lung Disease  POST-OPERATIVE DIAGNOSIS:  Interstitial Lung Disease  PROCEDURE:  Procedure(s):  XI ROBOTIC ASSISTED THORASCOPY  -Lung Biopsy Right Upper, Middle, Lower Lobe  SURGEON:  Surgeon(s) and Role:    * Melrose Nakayama, MD - Primary  PHYSICIAN ASSISTANT: Ellwood Handler PA-C  ASSISTANTS: none   ANESTHESIA:   general  EBL:  500 mL   BLOOD ADMINISTERED:none  DRAINS:  28 Blake drain right chest    LOCAL MEDICATIONS USED: Exparel  SPECIMEN:  Source of Specimen:  Biopsy Right Upper, Middle, Lower Lobe  DISPOSITION OF SPECIMEN:  PATHOLOGY  COUNTS:  YES  TOURNIQUET:  * No tourniquets in log *  DICTATION: .Dragon Dictation  PLAN OF CARE: Admit to inpatient   PATIENT DISPOSITION:  PACU - hemodynamically stable.   Delay start of Pharmacological VTE agent (>24hrs) due to surgical blood loss or risk of bleeding: no

## 2021-10-15 NOTE — Progress Notes (Signed)
Complained of pain on right arm and shoulder and claimed unable to move it. Repositioned for comfort with arm elevated with pillow. Scheduled toradol given early and claimed that she feels much better and can move the affected arm  freely after few min. Continue to monitor.

## 2021-10-15 NOTE — Progress Notes (Signed)
Complained of pain on right upper arm and shoulder, scale of 6 despite fentanyl given  in PACU at 12 noon. Repositioned with affected site elevated with pillow. Claimed that pain subsided after few min. Continue to monitor.

## 2021-10-15 NOTE — Anesthesia Procedure Notes (Signed)
Procedure Name: Intubation Date/Time: 10/15/2021 7:59 AM Performed by: Albertha Ghee, MD Pre-anesthesia Checklist: Patient identified, Emergency Drugs available, Suction available and Patient being monitored Patient Re-evaluated:Patient Re-evaluated prior to induction Oxygen Delivery Method: Circle system utilized Preoxygenation: Pre-oxygenation with 100% oxygen Induction Type: IV induction Ventilation: Mask ventilation without difficulty, Two handed mask ventilation required and Oral airway inserted - appropriate to patient size Laryngoscope Size: Glidescope and 3 Grade View: Grade I Tube type: Oral Endobronchial tube: Left, Double lumen EBT and EBT position confirmed by fiberoptic bronchoscope and 37 Fr Number of attempts: 2 Airway Equipment and Method: Oral airway, Video-laryngoscopy and Rigid stylet Placement Confirmation: ETT inserted through vocal cords under direct vision, positive ETCO2 and breath sounds checked- equal and bilateral Secured at: 27 cm Tube secured with: Tape Dental Injury: Teeth and Oropharynx as per pre-operative assessment

## 2021-10-15 NOTE — Interval H&P Note (Signed)
History and Physical Interval Note:   Na 126 this AM, creatinine normal 10/15/2021 7:25 AM  Lacey Erickson  has presented today for surgery, with the diagnosis of ILD.  The various methods of treatment have been discussed with the patient and family. After consideration of risks, benefits and other options for treatment, the patient has consented to  Procedure(s): XI ROBOTIC ASSISTED THORASCOPY (Right) LUNG BIOPSY (Right) as a surgical intervention.  The patient's history has been reviewed, patient examined, no change in status, stable for surgery.  I have reviewed the patient's chart and labs.  Questions were answered to the patient's satisfaction.     Melrose Nakayama

## 2021-10-15 NOTE — Transfer of Care (Signed)
Immediate Anesthesia Transfer of Care Note  Patient: Lacey Erickson  Procedure(s) Performed: XI ROBOTIC ASSISTED THORASCOPY (Right: Chest) LUNG BIOPSY (Right: Chest)  Patient Location: PACU  Anesthesia Type:General  Level of Consciousness: drowsy  Airway & Oxygen Therapy: Patient Spontanous Breathing and Patient connected to face mask oxygen  Post-op Assessment: Report given to RN and Post -op Vital signs reviewed and stable  Post vital signs: Reviewed and stable  Last Vitals:  Vitals Value Taken Time  BP 146/72 10/15/21 1105  Temp    Pulse 80 10/15/21 1110  Resp 18 10/15/21 1110  SpO2 94 % 10/15/21 1110  Vitals shown include unvalidated device data.  Last Pain:  Vitals:   10/15/21 0610  TempSrc:   PainSc: 8       Patients Stated Pain Goal: 4 (54/30/14 8403)  Complications: No notable events documented.

## 2021-10-16 ENCOUNTER — Inpatient Hospital Stay (HOSPITAL_COMMUNITY): Payer: Medicare Other

## 2021-10-16 LAB — BASIC METABOLIC PANEL
Anion gap: 9 (ref 5–15)
BUN: 8 mg/dL (ref 6–20)
CO2: 26 mmol/L (ref 22–32)
Calcium: 8 mg/dL — ABNORMAL LOW (ref 8.9–10.3)
Chloride: 88 mmol/L — ABNORMAL LOW (ref 98–111)
Creatinine, Ser: 0.67 mg/dL (ref 0.44–1.00)
GFR, Estimated: >60 mL/min (ref >60)
Glucose, Bld: 104 mg/dL — ABNORMAL HIGH (ref 70–99)
Potassium: 4.9 mmol/L (ref 3.5–5.1)
Sodium: 123 mmol/L — ABNORMAL LOW (ref 135–145)

## 2021-10-16 LAB — GLUCOSE, CAPILLARY
Glucose-Capillary: 101 mg/dL — ABNORMAL HIGH (ref 70–99)
Glucose-Capillary: 145 mg/dL — ABNORMAL HIGH (ref 70–99)
Glucose-Capillary: 105 mg/dL — ABNORMAL HIGH (ref 70–99)
Glucose-Capillary: 117 mg/dL — ABNORMAL HIGH (ref 70–99)
Glucose-Capillary: 83 mg/dL (ref 70–99)
Glucose-Capillary: 126 mg/dL — ABNORMAL HIGH (ref 70–99)

## 2021-10-16 LAB — CBC
HCT: 39.7 % (ref 36.0–46.0)
Hemoglobin: 12.6 g/dL (ref 12.0–15.0)
MCH: 27.2 pg (ref 26.0–34.0)
MCHC: 31.7 g/dL (ref 30.0–36.0)
MCV: 85.6 fL (ref 80.0–100.0)
Platelets: 197 10*3/uL (ref 150–400)
RBC: 4.64 MIL/uL (ref 3.87–5.11)
RDW: 16.7 % — ABNORMAL HIGH (ref 11.5–15.5)
WBC: 12.3 10*3/uL — ABNORMAL HIGH (ref 4.0–10.5)
nRBC: 0 % (ref 0.0–0.2)

## 2021-10-16 LAB — HEMOGLOBIN A1C
Hgb A1c MFr Bld: 6.2 % — ABNORMAL HIGH (ref 4.8–5.6)
Mean Plasma Glucose: 131.24 mg/dL

## 2021-10-16 MED ORDER — INSULIN ASPART 100 UNIT/ML IJ SOLN: 0.0000 [IU] | Freq: Every day | INTRAMUSCULAR | Status: DC

## 2021-10-16 MED ORDER — INSULIN ASPART 100 UNIT/ML IJ SOLN: 0.0000 [IU] | Freq: Three times a day (TID) | INTRAMUSCULAR | Status: DC

## 2021-10-16 NOTE — Op Note (Signed)
Lacey Erickson, Lacey Erickson MEDICAL RECORD NO: [id number] ACCOUNT NO: [id number] DATE OF BIRTH: [birth year] FACILITY: [facility] LOCATION: [facility] PHYSICIAN: Revonda Standard. Roxan Hockey, MD  Operative Report   DATE OF PROCEDURE: 10/15/2021  PREOPERATIVE DIAGNOSIS:  Interstitial lung disease.  POSTOPERATIVE DIAGNOSIS:  Interstitial lung disease.  PROCEDURE:   Robotic right VATS for lung biopsy.   Intercostal nerve blocks levels 3 through 10.  SURGEON:  Revonda Standard. Roxan Hockey, MD  ASSISTANT:  Ellwood Handler, PA-C.  ANESTHESIA: General.  FINDINGS:  Frozen section revealed ILD.  With second biopsy, there was a tear of the pleura with significant bleeding requiring suture repair.  CLINICAL NOTE:  Lacey Erickson is a 45 year old woman with a history of tobacco abuse and multiple other potential sources for pulmonary disease.  She is followed by Dr. Chase Caller.  She had progression of her shortness of breath and is referred for a lung  biopsy for diagnostic purposes.  The indications, risks, benefits, and alternatives were discussed in detail with the patient.  She understood and accepted the risks and agreed to proceed.  OPERATIVE NOTE:  The patient was brought to the preoperative holding area on 10/15/2021.  Anesthesia placed an arterial blood pressure monitoring line and established intravenous access.  She was taken to the operating room and anesthetized and intubated  with a double lumen endotracheal tube.  A Foley catheter was placed.  Intravenous antibiotics were administered.  Sequential compression devices were placed on the calves for DVT prophylaxis.  She was placed in the left lateral decubitus position and  the right chest was prepped and draped in the usual sterile fashion.  Single lung ventilation of the left lung was initiated and was tolerated well throughout the procedure.  A timeout was performed.  A solution containing 20 mL of liposomal bupivacaine, 30 mL of 0.5% bupivacaine and 50 mL of saline was  prepared.  This was used for local at the incision sites as well as for the intercostal nerve blocks.  An incision was made in the eighth interspace in the midaxillary line.  An 8 mm port was inserted.  The thoracoscope was advanced into the chest.  After confirming intrapleural placement, carbon dioxide was insufflated per protocol.  A 12 mm port was  placed in the eighth interspace anterior to the camera and a 12 mm AirSeal port was placed centered between the camera port and the anterior port. Intercostal nerve blocks were performed from the third to the tenth interspace.  10 mL of the bupivacaine  solution was then injected into the subpleural plane at each level.  Two additional eighth interspace ports then were placed.  The robot was deployed.  The camera arm was docked.  Targeting was performed.  The remaining arms were docked. The robotic  instruments were inserted with thoracoscopic visualization. A biopsy was taken from the inferior aspect of the right upper lobe.  There was a nodular-appearing lesion on the pleural surface in that area.  This was done with sequential firings of the  robotic stapler using green and black cartridges.  This specimen was sent for frozen section.  While awaiting results, a specimen was taken from the anterior aspect of the middle lobe with sequential firings of the robotic stapler and a specimen  was taken from the superior segment of the lower lobe.  With placement of the second staple line on the superior segment, the stapler was closed and bleeding was noted.  The stapler was fired.  It had torn the parenchyma and there was  an exposed pulmonary  vessel with significant bleeding.  Pressure was held on this area and the bleeding was controlled.  The area was subsequently sutured with a running 3-0 Vicryl suture. Inspected the area, it then appeared that the stapler could be placed to achieve  better closure of the area, but again with placement of the stapler, there  was bleeding noted and an additional suture was placed.  At this point, the frozen section returned showing no evidence of malignancy.  There was evidence of interstitial lung  disease.  The chest was copiously irrigated with warm saline. Test inflation of 30 cm of water revealed no significant air leakage.  The robotic instruments were removed.  The robot was undocked.  Progel was applied to the area of the suture repair.  A  28-French Blake drain was placed through the original port incision and directed to the apex.  It was secured with a #1 silk suture.  Dual lung ventilation was resumed.  The remaining incisions were closed in standard fashion.  The chest tube was placed  to a Pleur-Evac on waterseal.  The patient then was extubated in the operating room and taken to the [place] [facility] in good condition.  All sponge, needle and instrument counts were correct at the end of the procedure.  An experienced assistant was necessary for this case due to complexity.  Erin Barrett served that capacity and performed incisions, port placements, instrument exchange, suctioning and exposure, and wound closure during the procedure.     PAA D: 10/15/2021 6:06:30 pm T: 10/16/2021 3:41:00 am  JOB: [id number]/ [id number]

## 2021-10-16 NOTE — Progress Notes (Signed)
[address]       [place] [address]             [phone]      1 Day Post-Op Procedure(s) (LRB): XI ROBOTIC ASSISTED THORASCOPY (Right) LUNG BIOPSY (Right) INTERCOSTAL NERVE BLOCK (Right) Subjective: Up in the bedside chair, says she has been out for a walk this morning which she tolerated well.  Reports her breathing is about the same.  Pain is controlled.  She is tolerating p.o.'s.  Objective: Vital signs in last 24 hours: Temp:  [95.5 F (35.3 C)-98.5 F (36.9 C)] 98.1 F (36.7 C) (12/10 0733) Pulse Rate:  [70-89] 82 (12/10 0800) Cardiac Rhythm: Normal sinus rhythm (12/10 0701) Resp:  [11-19] 18 (12/10 0800) BP: (111-165)/(59-91) 132/65 (12/10 0800) SpO2:  [92 %-97 %] 95 % (12/10 0800) Arterial Line BP: (126-164)/(58-77) 157/76 (12/09 1235)  Hemodynamic parameters for last 24 hours:    Intake/Output from previous day: 12/09 0701 - 12/10 0700 In: 3849.4 [P.O.:520; I.V.:2979.4; IV Piggyback:350] Out: 2542 [Urine:970; Blood:500; Chest Tube:360] Intake/Output this shift: Total I/O In: 240 [P.O.:240] Out: 50 [Urine:50]  General appearance: alert, cooperative, and no distress Neurologic: intact Heart: regular rate and rhythm Lungs: Breath sounds are coarse but no wheezing and no rales.  360 mL thin serosanguineous fluid drained from chest tube since surgery.  There is no air leak. Wound: Expected bruising around incisions.  No subcu air.  Lab Results: Recent Labs    10/13/21 1321 10/15/21 0621 10/15/21 0900 10/16/21 0050  WBC 10.8*  --   --  12.3*  HGB 13.7   < > 12.6 12.6  HCT 42.7   < > 37.0 39.7  PLT 257  --   --  197   < > = values in this interval not displayed.   BMET:  Recent Labs    10/13/21 1321 10/15/21 0621 10/15/21 0900 10/16/21 0050  NA 123* 126* 129* 123*  K 3.9 4.0 2.9* 4.9  CL 86* 88*  --  88*  CO2 24  --   --  26  GLUCOSE 83 92  --  104*  BUN 10 8  --  8  CREATININE 0.58 0.80  --  0.67  CALCIUM 9.0  --    --  8.0*    PT/INR:  Recent Labs    10/13/21 1321  LABPROT 12.6  INR 0.9   ABG    Component Value Date/Time   PHART 7.346 (L) 10/15/2021 0900   HCO3 27.5 10/15/2021 0900   TCO2 29 10/15/2021 0900   O2SAT 88.0 10/15/2021 0900   CBG (last 3)  Recent Labs    10/16/21 0109 10/16/21 0353 10/16/21 0732  GLUCAP 101* 145* 105*   CLINICAL DATA:  Status post lung surgery.   EXAM: PORTABLE CHEST 1 VIEW   COMPARISON:  October 15, 2021.   FINDINGS: Stable cardiomediastinal silhouette. Right-sided chest tube is unchanged in position. Small right apical pneumothorax is noted. Minimal bibasilar subsegmental atelectasis is noted as well as postoperative change in the right midlung. Bony thorax is unremarkable.   IMPRESSION: Minimal bibasilar subsegmental atelectasis. Stable position of right-sided chest tube with small right apical pneumothorax.     Electronically Signed   By: Marijo Conception M.D.   On: 10/16/2021 09:37 Assessment/Plan: S/P Procedure(s) (LRB): XI ROBOTIC ASSISTED THORASCOPY (Right) LUNG BIOPSY (Right) INTERCOSTAL NERVE BLOCK (Right)  -Postop day 1 right video-assisted possibly for lung biopsy.  Path consistent with interstitial lung disease  as suspected.  Stable respiratory status.  No airleak.  Chest tube drainage is tapering off and the chest x-ray is stable.  We will leave on waterseal for now .  DC IV fluids and Foley catheter.  Mobilize.  -Hyponatremia-has been on half-normal saline overnight.  DC IV fluid.  Recheck in a.m.  -DVT prophylaxis-continue ambulation and daily enoxaparin.      LOS: 1 day    Antony Odea 10/16/2021

## 2021-10-17 ENCOUNTER — Inpatient Hospital Stay (HOSPITAL_COMMUNITY): Payer: Medicare Other

## 2021-10-17 LAB — COMPREHENSIVE METABOLIC PANEL
ALT: 7 U/L (ref 0–44)
AST: 16 U/L (ref 15–41)
Albumin: 2.9 g/dL — ABNORMAL LOW (ref 3.5–5.0)
Alkaline Phosphatase: 83 U/L (ref 38–126)
Anion gap: 11 (ref 5–15)
BUN: 12 mg/dL (ref 6–20)
CO2: 23 mmol/L (ref 22–32)
Calcium: 8 mg/dL — ABNORMAL LOW (ref 8.9–10.3)
Chloride: 89 mmol/L — ABNORMAL LOW (ref 98–111)
Creatinine, Ser: 0.68 mg/dL (ref 0.44–1.00)
GFR, Estimated: >60 mL/min (ref >60)
Glucose, Bld: 92 mg/dL (ref 70–99)
Potassium: 3.9 mmol/L (ref 3.5–5.1)
Sodium: 123 mmol/L — ABNORMAL LOW (ref 135–145)
Total Bilirubin: 0.2 mg/dL — ABNORMAL LOW (ref 0.3–1.2)
Total Protein: 5.3 g/dL — ABNORMAL LOW (ref 6.5–8.1)

## 2021-10-17 LAB — CBC
HCT: 35.2 % — ABNORMAL LOW (ref 36.0–46.0)
Hemoglobin: 11.2 g/dL — ABNORMAL LOW (ref 12.0–15.0)
MCH: 27.2 pg (ref 26.0–34.0)
MCHC: 31.8 g/dL (ref 30.0–36.0)
MCV: 85.4 fL (ref 80.0–100.0)
Platelets: 169 10*3/uL (ref 150–400)
RBC: 4.12 MIL/uL (ref 3.87–5.11)
RDW: 16.7 % — ABNORMAL HIGH (ref 11.5–15.5)
WBC: 11.1 10*3/uL — ABNORMAL HIGH (ref 4.0–10.5)
nRBC: 0 % (ref 0.0–0.2)

## 2021-10-17 LAB — GLUCOSE, CAPILLARY
Glucose-Capillary: 103 mg/dL — ABNORMAL HIGH (ref 70–99)
Glucose-Capillary: 97 mg/dL (ref 70–99)
Glucose-Capillary: 106 mg/dL — ABNORMAL HIGH (ref 70–99)
Glucose-Capillary: 114 mg/dL — ABNORMAL HIGH (ref 70–99)

## 2021-10-17 MED ORDER — FUROSEMIDE 40 MG PO TABS
40.0000 mg | ORAL_TABLET | Freq: Every day | ORAL | Status: DC
  Administered 2021-10-17 – 2021-10-18 (×2): 40 mg via ORAL
  Filled 2021-10-17 (×2): qty 1

## 2021-10-17 MED ORDER — POTASSIUM CHLORIDE CRYS ER 10 MEQ PO TBCR
20.0000 meq | EXTENDED_RELEASE_TABLET | Freq: Every day | ORAL | Status: DC
  Administered 2021-10-17 – 2021-10-18 (×2): 20 meq via ORAL
  Filled 2021-10-17 (×3): qty 2

## 2021-10-17 NOTE — Progress Notes (Signed)
2 Days Post-Op Procedure(s) (LRB): XI ROBOTIC ASSISTED THORASCOPY (Right) LUNG BIOPSY (Right) INTERCOSTAL NERVE BLOCK (Right) Subjective: Feels "much better" this AM  Objective: Vital signs in last 24 hours: Temp:  [98.2 F (36.8 C)-98.5 F (36.9 C)] 98.3 F (36.8 C) (12/11 0722) Pulse Rate:  [68-80] 80 (12/11 0722) Cardiac Rhythm: Normal sinus rhythm (12/11 0700) Resp:  [10-20] 18 (12/11 0722) BP: (125-154)/(61-81) 139/81 (12/11 0722) SpO2:  [90 %-99 %] 92 % (12/11 0722)  Hemodynamic parameters for last 24 hours:    Intake/Output from previous day: 12/10 0701 - 12/11 0700 In: 1040 [P.O.:1040] Out: 1010 [Urine:700; Chest Tube:310] Intake/Output this shift: Total I/O In: 240 [P.O.:240] Out: 300 [Urine:300]  General appearance: alert, cooperative, and no distress Neurologic: intact Heart: regular rate and rhythm Lungs: rales bilaterally Wound: clean and dry No air leak  Lab Results: Recent Labs    10/16/21 0050 10/17/21 0038  WBC 12.3* 11.1*  HGB 12.6 11.2*  HCT 39.7 35.2*  PLT 197 169   BMET:  Recent Labs    10/16/21 0050 10/17/21 0038  NA 123* 123*  K 4.9 3.9  CL 88* 89*  CO2 26 23  GLUCOSE 104* 92  BUN 8 12  CREATININE 0.67 0.68  CALCIUM 8.0* 8.0*    PT/INR: No results for input(s): LABPROT, INR in the last 72 hours. ABG    Component Value Date/Time   PHART 7.346 (L) 10/15/2021 0900   HCO3 27.5 10/15/2021 0900   TCO2 29 10/15/2021 0900   O2SAT 88.0 10/15/2021 0900   CBG (last 3)  Recent Labs    10/16/21 1528 10/16/21 2123 10/17/21 0645  GLUCAP 83 126* 103*    Assessment/Plan: S/P Procedure(s) (LRB): XI ROBOTIC ASSISTED THORASCOPY (Right) LUNG BIOPSY (Right) INTERCOSTAL NERVE BLOCK (Right) POD # 2 No air leak with repeated cough, CXR shows SQ emphysema, no significant pneumo- will dc chest tube She is volume overloaded- will give PO Lasix Continue ambulation SCD + enoxaparin Wean O2- home in AM if we can get her down to 2L  [place]   LOS: 2 days    Melrose Nakayama 10/17/2021

## 2021-10-18 ENCOUNTER — Encounter (HOSPITAL_COMMUNITY): Payer: Self-pay | Admitting: Thoracic Surgery (Cardiothoracic Vascular Surgery)

## 2021-10-18 ENCOUNTER — Inpatient Hospital Stay (HOSPITAL_COMMUNITY): Payer: Medicare Other

## 2021-10-18 LAB — GLUCOSE, CAPILLARY: Glucose-Capillary: 110 mg/dL — ABNORMAL HIGH (ref 70–99)

## 2021-10-18 MED ORDER — AMLODIPINE BESYLATE 5 MG PO TABS: 5.0000 mg | ORAL_TABLET | Freq: Every day | ORAL | 3 refills | Status: DC

## 2021-10-18 MED ORDER — ACETAMINOPHEN 500 MG PO TABS: 1000.0000 mg | ORAL_TABLET | Freq: Four times a day (QID) | ORAL | 0 refills | Status: DC | PRN

## 2021-10-18 MED ORDER — AMLODIPINE BESYLATE 5 MG PO TABS
5.0000 mg | ORAL_TABLET | Freq: Every day | ORAL | Status: DC
  Administered 2021-10-18: 5 mg via ORAL
  Filled 2021-10-18: qty 1

## 2021-10-18 MED ORDER — POTASSIUM CHLORIDE CRYS ER 20 MEQ PO TBCR
20.0000 meq | EXTENDED_RELEASE_TABLET | Freq: Every day | ORAL | 0 refills | Status: DC
Start: 2021-10-18 — End: 2021-10-28

## 2021-10-18 MED ORDER — FUROSEMIDE 40 MG PO TABS
40.0000 mg | ORAL_TABLET | Freq: Every day | ORAL | 0 refills | Status: DC
Start: 2021-10-18 — End: 2021-10-28

## 2021-10-18 NOTE — Care Management Important Message (Signed)
Important Message  Patient Details  Name: Lacey Erickson MRN: [id number] Date of Birth: 13-Dec-1975   Medicare Important Message Given:  Yes  Patient left prior to IM delivery will mail to the patient home address.   Lorain Fettes 10/18/2021, 2:14 PM

## 2021-10-18 NOTE — Telephone Encounter (Signed)
Called and spoke with pt letting her know the results of the ONO and stated to her that she does need to wear 2L O2 at night and pt verbalized understanding. Pt already has O2 at home. Nothing further needed.

## 2021-10-18 NOTE — Progress Notes (Addendum)
      [address]       [place] [address]             [phone]      3 Days Post-Op Procedure(s) (LRB): XI ROBOTIC ASSISTED THORASCOPY (Right) LUNG BIOPSY (Right) INTERCOSTAL NERVE BLOCK (Right)  Subjective:  Patient states she is doing much better.  Has been weaned down to 2L of oxygen which is what she is on at home.    Objective: Vital signs in last 24 hours: Temp:  [97.9 F (36.6 C)-98.4 F (36.9 C)] 98.2 F (36.8 C) (12/12 0400) Pulse Rate:  [66-95] 95 (12/12 0700) Cardiac Rhythm: Normal sinus rhythm (12/12 0400) Resp:  [12-19] 17 (12/12 0700) BP: (139-180)/(75-115) 180/94 (12/12 0500) SpO2:  [91 %-100 %] 92 % (12/12 0400)  Intake/Output from previous day: 12/11 0701 - 12/12 0700 In: 960 [P.O.:960] Out: 900 [Urine:900]   General appearance: alert, cooperative, and no distress Heart: regular rate and rhythm Lungs: CTA mildly diminshed bibasilar Abdomen: soft, non-tender; bowel sounds normal; no masses,  no organomegaly Extremities: extremities normal, atraumatic, no cyanosis or edema Wound: clean and dry  Lab Results: Recent Labs    10/16/21 0050 10/17/21 0038  WBC 12.3* 11.1*  HGB 12.6 11.2*  HCT 39.7 35.2*  PLT 197 169   BMET:  Recent Labs    10/16/21 0050 10/17/21 0038  NA 123* 123*  K 4.9 3.9  CL 88* 89*  CO2 26 23  GLUCOSE 104* 92  BUN 8 12  CREATININE 0.67 0.68  CALCIUM 8.0* 8.0*    PT/INR: No results for input(s): LABPROT, INR in the last 72 hours. ABG    Component Value Date/Time   PHART 7.346 (L) 10/15/2021 0900   HCO3 27.5 10/15/2021 0900   TCO2 29 10/15/2021 0900   O2SAT 88.0 10/15/2021 0900   CBG (last 3)  Recent Labs    10/17/21 1602 10/17/21 2143 10/18/21 0632  GLUCAP 106* 114* 110*    Assessment/Plan: S/P Procedure(s) (LRB): XI ROBOTIC ASSISTED THORASCOPY (Right) LUNG BIOPSY (Right) INTERCOSTAL NERVE BLOCK (Right)  CV- NSR, + HTN- continue home Minipress, some increase can be related to  discomfort, will add low dose Norvasc, would recommended repeat BP check at PCP in a few weeks to ensure BP remains controlled Pulm- oxygen has been weaned to 2L via [place] which is patient's home level, CXR shows stable trace apical pneumothorax, + atelectasis Renal- creatinine, lytes have been normal- Lasix, potassium as ordered Dispo- patient is medically stable, will d/c home today    LOS: 3 days    Ellwood Handler, PA-C 10/18/2021  Patient seen and examined, agree with above Looks great CXR unchanged tiny apical pneumo Dc home.  Will f/u with me and Dr. Iran Sizer C. Roxan Hockey, MD [facility] [phone]

## 2021-10-19 NOTE — Anesthesia Postprocedure Evaluation (Signed)
Anesthesia Post Note  Patient: Desa Rech  Procedure(s) Performed: XI ROBOTIC ASSISTED THORASCOPY (Right: Chest) LUNG BIOPSY (Right: Chest) INTERCOSTAL NERVE BLOCK (Right: Chest)     Patient location during evaluation: PACU Anesthesia Type: General Level of consciousness: awake and alert Pain management: pain level controlled Vital Signs Assessment: post-procedure vital signs reviewed and stable Respiratory status: spontaneous breathing, nonlabored ventilation, respiratory function stable and patient connected to nasal cannula oxygen Cardiovascular status: blood pressure returned to baseline and stable Postop Assessment: no apparent nausea or vomiting Anesthetic complications: no   No notable events documented.  Last Vitals:  Vitals:   10/18/21 0700 10/18/21 0804  BP:  (!) 165/88  Pulse: 95 85  Resp: 17 13  Temp:  36.8 C  SpO2:  93%    Last Pain:  Vitals:   10/18/21 0710  TempSrc:   PainSc: [place]

## 2021-10-21 ENCOUNTER — Encounter: Payer: Self-pay | Admitting: Thoracic Surgery (Cardiothoracic Vascular Surgery)

## 2021-10-21 LAB — SURGICAL PATHOLOGY

## 2021-10-22 ENCOUNTER — Encounter: Payer: Self-pay | Admitting: Internal Medicine

## 2021-10-22 ENCOUNTER — Encounter: Payer: Self-pay | Admitting: Thoracic Surgery (Cardiothoracic Vascular Surgery)

## 2021-10-22 NOTE — Telephone Encounter (Signed)
Several resp complaints all 1-5% - at this low level it is typically other causes in a patient on this medication and only rarely direct attribution to the patch.   Only way to know if she stops and sees what happens  Thanks  .[email or web address]

## 2021-10-22 NOTE — Telephone Encounter (Signed)
MR please advise. Thanks  For quite awhile before I came to you I was using a pain patch that my husband just found out may have possibly caused my trouble breathing. It's called Beprenorphine patch. Anyway you could look into that to see if maybe that could have caused my problems? Thank you in advance!

## 2021-10-24 ENCOUNTER — Encounter: Payer: Self-pay | Admitting: Thoracic Surgery (Cardiothoracic Vascular Surgery)

## 2021-10-25 ENCOUNTER — Other Ambulatory Visit: Payer: Self-pay | Admitting: Thoracic Surgery (Cardiothoracic Vascular Surgery)

## 2021-10-25 ENCOUNTER — Telehealth: Payer: Self-pay | Admitting: *Deleted

## 2021-10-25 DIAGNOSIS — Z9889 Other specified postprocedural states: Secondary | ICD-10-CM

## 2021-10-25 NOTE — Telephone Encounter (Signed)
Patient contacted the office via mychart messenger. Per patient, the dermabond on her incisions is falling off. Advised patient to don't put any liquid band on incisions. Advised patient glue is suppose to start falling off and to keep surgical sites clean and dry. Patient states she feels pressure on her right side. Per patient, this has been the same pressure since surgery. Denies any SOB and continues to wear home oxygen. Advised patient to continue to practice taking deep breaths with incentive spirometer and to call our office if it changes in status or she becomes SOB. Patient verbalizes understanding.

## 2021-10-28 ENCOUNTER — Ambulatory Visit (INDEPENDENT_AMBULATORY_CARE_PROVIDER_SITE_OTHER): Payer: Self-pay | Admitting: Thoracic Surgery (Cardiothoracic Vascular Surgery)

## 2021-10-28 ENCOUNTER — Encounter: Payer: Self-pay | Admitting: Thoracic Surgery (Cardiothoracic Vascular Surgery)

## 2021-10-28 ENCOUNTER — Ambulatory Visit
Admission: RE | Admit: 2021-10-28 | Discharge: 2021-10-28 | Disposition: A | Discharge status: Home / Self Care | Payer: Medicare Other | Source: Ambulatory Visit | Attending: Thoracic Surgery (Cardiothoracic Vascular Surgery) | Admitting: Thoracic Surgery (Cardiothoracic Vascular Surgery)

## 2021-10-28 ENCOUNTER — Other Ambulatory Visit: Payer: Self-pay

## 2021-10-28 VITALS — BP 176/94 | HR 86 | Resp 20 | Ht 62.0 in | Wt 221.0 lb

## 2021-10-28 DIAGNOSIS — Z9889 Other specified postprocedural states: Secondary | ICD-10-CM

## 2021-10-28 DIAGNOSIS — J849 Interstitial pulmonary disease, unspecified: Secondary | ICD-10-CM

## 2021-10-28 DIAGNOSIS — Z09 Encounter for follow-up examination after completed treatment for conditions other than malignant neoplasm: Secondary | ICD-10-CM

## 2021-10-28 NOTE — Progress Notes (Signed)
[address]       [place] [address]             [phone]     HPI: Mrs. Lacey Erickson returns for scheduled postoperative follow-up visit  Lacey Erickson is a 45 year old woman with a history of tobacco abuse, rheumatoid arthritis, bipolar disorder, hyperlipidemia, reflux, cervical cancer, anxiety, depression, and chronic pain.  She has about a 99-month history of dyspnea and has been diagnosed with ILD.  She had multiple potential sources and her CT was not consistent with UIP.  She was referred for surgical lung biopsy.  I did a robotic right VATS for lung biopsy on 10/15/2021.  Her postoperative course was uncomplicated and she went home on day 3.  She feels well.  She has a lot of numbness in the right chest.  She also has some pain.  She is on oxycodone and gabapentin at baseline.  She has had some drainage from the chest tube site.  She is not having any respiratory issues.  She does complain of redness at an IV site on her arm as well.  Past Medical History:  Diagnosis Date   Anxiety and depression    Bipolar disorder ([place])    Cervical cancer (HCC)    Chronic respiratory failure (HCC)    COPD (chronic obstructive pulmonary disease) (HCC)    Diabetes mellitus without complication (HCC)    Diverticulitis    Dyspnea    Elevated LFTs    Fibromyalgia    GERD (gastroesophageal reflux disease)    Hepatic steatosis    History of kidney stones    Hyperlipemia    Hypertension    Ileus (HCC)    Osteopenia    Oxygen dependent    RHA (rheumatoid arthritis) (HCC)     Current Outpatient Medications  Medication Sig Dispense Refill   albuterol (VENTOLIN HFA) 108 (90 Base) MCG/ACT inhaler Inhale 2 puffs into the lungs every 6 (six) hours as needed for wheezing or shortness of breath.     amLODipine (NORVASC) 5 MG tablet Take 1 tablet (5 mg total) by mouth daily. 30 tablet 3   cyclobenzaprine (FLEXERIL) 10 MG tablet Take 10 mg by mouth 2 (two) times daily as needed  for muscle spasms.     fluvoxaMINE (LUVOX) 100 MG tablet Take 100 mg by mouth 2 (two) times daily.     gabapentin (NEURONTIN) 800 MG tablet Take 800 mg by mouth 3 (three) times daily.     hyoscyamine (LEVSIN SL) 0.125 MG SL tablet PLACE 1-2 TABLET UNDER THE TONGUE EVERY 4 HOURS AS NEEDED FOR PAIN 60 tablet 0   ondansetron (ZOFRAN ODT) 4 MG disintegrating tablet Take 1 tablet (4 mg total) by mouth every 8 (eight) hours as needed for nausea or vomiting. 30 tablet 2   Oxcarbazepine (TRILEPTAL) 300 MG tablet Take 300 mg by mouth in the morning, at noon, and at bedtime.     oxyCODONE-acetaminophen (PERCOCET) 10-325 MG tablet Take 1 tablet by mouth 3 (three) times daily as needed for pain.     pantoprazole (PROTONIX) 40 MG tablet Take 1 tablet (40 mg total) by mouth daily. 90 tablet 3   prazosin (MINIPRESS) 5 MG capsule Take 5 mg by mouth at bedtime.     risperiDONE (RISPERDAL) 0.25 MG tablet Take 0.25 mg by mouth 2 (two) times daily.     rizatriptan (MAXALT) 5 MG tablet Take 5 mg by mouth as needed for migraine. May repeat in  2 hours if needed     rosuvastatin (CRESTOR) 10 MG tablet Take 10 mg by mouth at bedtime.     Semaglutide,0.25 or 0.5MG /DOS, (OZEMPIC, 0.25 OR 0.5 MG/DOSE,) 2 MG/1.5ML SOPN Inject 0.25 mg into the skin every Thursday.     sulfaSALAzine (AZULFIDINE) 500 MG tablet Take 500 mg by mouth 2 (two) times daily.     No current facility-administered medications for this visit.    Physical Exam BP (!) 176/94 (BP Location: Left Arm, Patient Position: Sitting, Cuff Size: Normal)    Pulse 86    Resp 20    Ht 5\' 2"  (1.575 m)    Wt 221 lb (100.2 kg)    SpO2 94% Comment: RA   BMI 40.104 kg/m  45 year old woman in no acute distress Alert and oriented x3 with no focal deficits Lungs diminished but equal bilaterally Cardiac regular rate and rhythm Incisions: Erythema at chest tube site Erythema and superficial skin ulceration right forearm, no fluctuance  Diagnostic Tests: I personally  reviewed her chest x-ray.  Shows postoperative changes.  Pathology showed smoking-related interstitial fibrosis  Impression: Lacey Erickson is a 45 year old woman with interstitial lung disease who underwent a robotic right VATS for lung biopsy on 10/15/2021.  Overall she has done well.  She went home on day 3.  She has a lot of numbness but her pain has been manageable.  Biopsy showed smoking-related interstitial fibrosis.  I have once again emphasized the importance of tobacco cessation.  She has an appointment with Dr. Chase Erickson the first week of January.  She has some redness around her chest tube site which is probably mostly local irritation from the suture.  She also has an area where there is a very small area of some superficial skin breakdown and erythema related to an old IV site.  There is not really a thrombosed vein or "cord" notable on exam.  Recommend she apply Neosporin to those sites but to contact me immediately if she has any progression.  Plan:  Quit smoking Neosporin to chest tube site and forearm.  Call if redness, drainage, or pain increases Follow-up with Dr. Chase Erickson as scheduled  Lacey Nakayama, MD [facility] [phone]

## 2021-11-04 ENCOUNTER — Encounter: Payer: Self-pay | Admitting: Thoracic Surgery (Cardiothoracic Vascular Surgery)

## 2021-11-11 ENCOUNTER — Ambulatory Visit (INDEPENDENT_AMBULATORY_CARE_PROVIDER_SITE_OTHER): Payer: Medicare Other | Admitting: Internal Medicine

## 2021-11-11 ENCOUNTER — Encounter: Payer: Self-pay | Admitting: Internal Medicine

## 2021-11-11 ENCOUNTER — Other Ambulatory Visit: Payer: Self-pay

## 2021-11-11 VITALS — BP 126/74 | HR 86 | Temp 98.4°F | Ht 62.0 in | Wt 216.0 lb

## 2021-11-11 DIAGNOSIS — Z87891 Personal history of nicotine dependence: Secondary | ICD-10-CM

## 2021-11-11 DIAGNOSIS — J849 Interstitial pulmonary disease, unspecified: Secondary | ICD-10-CM

## 2021-11-11 DIAGNOSIS — J439 Emphysema, unspecified: Secondary | ICD-10-CM | POA: Diagnosis not present

## 2021-11-11 DIAGNOSIS — R0789 Other chest pain: Secondary | ICD-10-CM

## 2021-11-11 DIAGNOSIS — Z7729 Contact with and (suspected ) exposure to other hazardous substances: Secondary | ICD-10-CM | POA: Diagnosis not present

## 2021-11-11 DIAGNOSIS — G8918 Other acute postprocedural pain: Secondary | ICD-10-CM

## 2021-11-11 DIAGNOSIS — R918 Other nonspecific abnormal finding of lung field: Secondary | ICD-10-CM

## 2021-11-11 MED ORDER — SPIRIVA RESPIMAT 2.5 MCG/ACT IN AERS: 2.0000 | INHALATION_SPRAY | Freq: Every day | RESPIRATORY_TRACT | 5 refills | Status: DC

## 2021-11-11 NOTE — Patient Instructions (Addendum)
ILD (interstitial lung disease) (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] -This is permanent and can get worse if you do not quit smoking -Appears in the past you have tried Chantix and it did not work for you  Plan  - Quit smoking -Wellbutrin is an option [given the fact you have depression and previous suicide attempt and see a psychiatrist and on many psychiatric medications best this is coordinated through a psychiatrist] -Still you should get rid of the bird -Do spirometry and DLCO in 3 months -We will discuss  case conference your pathology   Pulmonary emphysema, unspecified emphysema type ([place])  -You do have some mild associated emphysema seen on CT scan of the chest  Plan - Start Spiriva once daily 2 puffs each time - Check alpha-1 antitrypsin phenotype   Multiple pulmonary nodules determined by computed tomography of lung -5 mm in the right upper lobe July 2022  Plan - Next high-resolution CT scan of the chest July 2023  Chest wall pain following surgery -  -You will have chronic pain and there is a risk factor for increased postoperative incisional pain.  You are already on opioids and gabapentin  Plan - Please discuss with the pain doctor as to how best to control this pain   Follow-up - Return to see Dr. Chase Caller in 3 months for 30-minute visit but after breathing test.  Hopefully of quit smoking by then

## 2021-11-11 NOTE — Progress Notes (Signed)
OV 08/13/2020  Subjective:  Patient ID: Lacey Erickson, female , DOB: 1976-05-29 , age 46 y.o. , MRN: [id number] , ADDRESS: [address] [place] [address] PCP Raina Mina., MD Relevant Other Providers: - x This Provider for this visit: Treatment Team:  Attending Provider: Brand Males, MD    08/13/2020 -   Chief Complaint  Patient presents with   Consult    Pt being referred due to having fluid on lungs via cxr. Pt has complaints of SOB, cough, wheezing, and chest tightness.     HPI Lacey Erickson 46 y.o. -presents with her husband.  Referred by primary care specialist.  She lives near [place], [place].  She tells me that at baseline she is disabled because of bipolar and fibromyalgia and osteoarthritis and prediabetes.  She also carries a diagnosis of rheumatoid arthritis not otherwise specified made many years ago.  But she is not on any immunomodulators.  She also suffers from obesity.  She is a chronic smoker.  Then approximately 6 months ago developed insidious onset of shortness of breath, cough and wheezing.  All the symptoms of present and made worse by exertion and relieved by rest.  The wheezing and cough also wake her up in the middle of the night.  She can just walk across to the bathroom and get worse with all the symptoms coming on together.  In addition she is also reporting a chest pain that feels like a "elephant in the chest" but she denies any previous heart attack.  She says approximately 3 to 4 months ago she was admitted to [facility] because chest x-ray showed fluid in her chest and lungs not otherwise specified.  She says " nothing much" was done at the hospital and then she was discharged.  However symptoms have persisted and is getting worse.  Symptoms are rated as moderate to severe overall.  She has tried Spiriva for her symptoms it does not help.  She has been on multiple prednisone courses and this is helped.  Not been on any inhaled  steroids.  She denies any mold or mildew exposure.  She denies any vaping or electronic cigarette use or cocaine use.  Only substance of abuse is tobacco.  She denies any occupational exposure to mold.  FENO is 5 and  Heavy smoking 2.5 pack x 33 years  Coronary artery disease risk factors include smoking, obesity, prediabetes and hyperlipidemia  Results for NORETTA, FRIER (MRN [id number]) as of 08/13/2020 13:57  Ref. Range 05/22/2018 14:45 11/22/2018 12:14 12/11/2018 09:06  Creatinine Latest Ref Range: 0.40 - 1.20 mg/dL 1.12 0.90 1.19  Results for DARBI, CHANDRAN (MRN [id number]) as of 08/13/2020 13:57  Ref. Range 12/11/2018 09:06  Hemoglobin Latest Ref Range: 12.0 - 15.0 g/dL 13.6   Results for ZHOE, CATANIA (MRN [id number]) as of 08/13/2020 13:57  Ref. Range 12/11/2018 09:06  Eosinophils Absolute Latest Ref Range: 0 - 0 K/uL 0.4     09/03/2020  - Visit   46 year old female current everyday smoker followed in our office for dyspnea on exertion interstitial lung disease.  She initially was consulted with Dr. Chase Caller on 08/13/2020.  Patient had lab work that showed positive Rast allergy panel to ragweed.  Mildly elevated IgE of 111 and mildly elevated CBC with differential with eosinophil count of 300.  Patient felt significantly better when on steroids.  She did not tolerate starting Symbicort.  She felt jittery.  She has not tolerated Spiriva HandiHaler.  She  does not like DPI's.  She has been managed by many other specialist in the past.  Last pulmonary specialist was Dr. Carren Rang in [place].  She reports that she had a breathing test and a sleep study done there.  She is unsure the results.  She reports that she did have obstructive sleep apnea and was started on CPAP therapy but she could not tolerate.  She also has a history of rheumatoid arthritis.  Last managed by rheumatology at [facility] Dr. Eliberto Ivory in 2018.  She is currently not seeing any providers for management of her RA.  Last  assessment and plan from February/2018 office visit with Dr. Eliberto Ivory is listed below:  Feb/ 2018 ASSESSMENT: 46 year old female with rheumatoid arthritis on Arava 10 mg daily. She is improved but still has active arthritis on examination. I think it is reasonable to increase her Arava to 20 mg daily to see if this will better control her arthritis. We can do a short course of prednisone for symptomatic relief given her tender and swollen joints on exam today. Not sure that [place] alone will be sufficient but she would like to stay off of TNF at this time. Will check toxicity labs today especially given her history of elevated LFTs. Of note, she has noted some mild alopecia and suspect that this is from the leflunomide. Typically the side effect is transient and so will continue the medication at this time. She had a bone density done October 2017 that showed osteopenia. Her flax was low. Encouraged calcium and vitamin D as well as weightbearing exercise. We will try to minimize prednisone.   PLAN: 1. Increase Arava to 20 mg daily. Toxicity labs today and every 3 months. 2. Short course of prednisone for symptomatic relief. 3. Bone density done 08/2016 shows osteopenia continue calcium and vitamin D. 4. Return to clinic in 4 months.   Patient also reporting that last year she had nasal polyps removed by an ENT/surgeon in [place].  We do not have these records.  We will try to request these today.  Patient reports that she has persistent rhinitis symptoms.  She does take Claritin daily.  Sometimes she does take Benadryl.  She does not use any nasal meds.  She continues to have significant dyspnea on exertion as well as lower extremity swelling.  She reports her primary care recently refilled her diuretics.  But at a lower dose.  She is unsure why.  She is currently changing primary care providers and is planning on establishing with Dr. Salvadore Oxford.  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  Recommending repeat high-resolution CT chest in 1 year.  Patient would like flu vaccine if available today  Questionaires / Pulmonary Flowsheets:   ACT:  No flowsheet data found.  MMRC: mMRC Dyspnea Scale mMRC Score  09/03/2020 3    Epworth:  No flowsheet data found.    08/28/2020-high-resolution CT chest-appearance of lungs may suggest interstitial lung disease with spectrum of findings considered most compatible with an alternative diagnosis to UIP, primary differential consideration is that of nonspecific interstitial pneumonia, repeat high-resolution CT chest in 12 months to assess for changes, 4 mm nodule in the left upper lobe, aortic arthrosclerosis  FENO:  Lab Results  Component Value Date   NITRICOXIDE 5 08/13/2020      No results found.   xxxxxxxxxxxxxxxx[id number]  OV 04/01/2021  Subjective:  Patient ID: Lacey Erickson, female , DOB: 08-23-1976 , age 30 y.o. , MRN: [id number] ,  ADDRESS: [address] [place] [address] PCP Maris Berger, MD Patient Care Team: Maris Berger, MD as PCP - General [facility] Medicine)  This Provider for this visit: Treatment Team:  Attending Provider: Brand Males, MD    04/01/2021 -   Chief Complaint  Patient presents with   Follow-up    PFT performed today.  Pt has not been feeling good since last visit. States she has had a cough since last visit and will occ cough up phlegm. Pt also has been hoarse and has had some wheezing too.     HPI Lacey Erickson 46 y.o. -returns for follow-up.  Last time I saw her and after that she saw a nurse practitioner.  She ended up not having asthma but has ILD.  Multiple action items were given to her including seeing rheumatologist but this has not been done yet.  She is asked to quit smoking but she has not quit yet.  She did have a high-resolution CT chest but she thinks it was at [facility] or in [place].  We  only have the documentation from the nurse practitioner.  I do not have the image from that.  It is alternative diagnosis.  She had autoimmune and vasculitis profile.  This is all negative including rheumatoid factor.  She is currently here with her husband.  In summary ILD work-up is in progress.  Therefore asked her to fill out the ILD questionnaire. PFT shows estrcitn   [place] Integrated Comprehensive ILD Questionnaire  Symptoms:   SYMPTOM SCALE - ILD 04/01/2021   O2 use *ra  Shortness of Breath 0 -> 5 scale with 5 being worst (score 6 If unable to do)  At rest 0  Simple tasks - showers, clothes change, eating, shaving 5  Household (dishes, doing bed, laundry) 6  Shopping 3  Walking level at own pace 5  Walking up Stairs 6  Total (30-36) Dyspnea Score 25 ,x 6 eyear and getting worse.   How bad is your cough?  5 - Since 2013, Worse over time  Worse lying down, clears throat,   How bad is your fatigue 5  How bad is nausea 5  How bad is vomiting?  3  How bad is diarrhea? 3  How bad is anxiety? 5  How bad is depression 5        Past Medical History :  -Positive for asthma and COPD for the last several years.  Rheumatoid arthritis for the last several years [rheumatoid factor and CCP negative here].  Positive hiatal hernia/acid reflux for the last several years.  Positive for diabetes otherwise negative.  She has had the COVID-vaccine but never had COVID not hospitalized for COVID   ROS: Positive for arthralgia, dysphagia, fatigue and dry eyes for the last several years to few months.  Also nausea and vomiting for the last several years.  Has heartburn for the last several years.  Has snoring for the last several years.   FAMILY HISTORY of LUNG DISEASE: Denies   EXPOSURE HISTORY: Smokes cigarettes since 1988.  1 pack a day.  Actually more.  She is lived in the same house.  There is a passive smoking.  No pipes no cigars.  Does not use marijuana in the form of vaping but has  smoked regular marijuana in the past.  She does not use cocaine or intravenous drugs.   HOME and HOBBY DETAILS : [place]  She lives in  a 47 year old home for the last 16 years.  Its a trailer.  She does have a African gray bird x2 tx 2 yers kept outside her bedroom. Husband cleans this. Walks by this area regularly here is no dampness.  No mildew or mold.  No humidifier use no CPAP use no nebulizer use no steam iron use.  No Jacuzzi use.  No misting Fountain.  No pet gerbils no feather pillows.  No mold in the [facility] duct.  No music habits no gardening habits.  No hot tub or Jacuzzi or sauna.  No straw mat.   OCCUPATIONAL HISTORY (122 questions) : Extensive review of her organic antigen history at the occupational workplace is negative.  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   PULMONARY TOXICITY HISTORY (27 items): Did take methotrexate in 2013 and prednisone in 2020.  Is currently on lisinopril.  Her husband is also on this.  They both have cough.    Results for SCOTTLYNN, LINDELL (MRN [id number]) as of 04/01/2021 14:02  Ref. Range 09/03/2020 15:25  Anti Nuclear Antibody (ANA) Latest Ref Range: NEGATIVE  NEGATIVE  Cyclic Citrullin Peptide Ab Latest Units: UNITS <16  RA Latex Turbid. Latest Ref Range: <14 IU/mL <14  ANA,IFA RA DIAG PNL W/RFLX TIT/PATN Unknown Rpt  Anti-Jo-1 Ab (RDL) Latest Ref Range: <20 Units <20  Anti-PL-7 Ab (RDL) Latest Ref Range: Negative  Negative  Anti-PL-12 Ab (RDL) Latest Ref Range: Negative  Negative  Anti-EJ Ab (RDL) Latest Ref Range: Negative  Negative  Anti-OJ Ab (RDL) Latest Ref Range: Negative  Negative  Anti-SRP Ab (RDL) Latest Ref Range: Negative  Negative  Anti-Mi-2 Ab (RDL) Latest Ref Range: Negative  Negative  Anti-TIF-1gamma Ab (RDL) Latest Ref Range: <20 Units <20  Anti-MDA-5 Ab (CADM-140)(RDL) Latest Ref Range: <20 Units <20  Anti-NXP-2 (P140) Ab (RDL)  Latest Ref Range: <20 Units <20  Anti-SAE1 Ab, IgG (RDL) Latest Ref Range: <20 Units <20  Anti-PM/Scl-100 Ab (RDL) Latest Ref Range: <20 Units <20  Anti-Ku Ab (RDL) Latest Ref Range: Negative  Negative  Anti-SS-A 52kD Ab, IgG (RDL) Latest Ref Range: <20 Units <20  Anti-U1 RNP Ab (RDL) Latest Ref Range: <20 Units <20  Anti-U2 RNP Ab (RDL) Latest Ref Range: Negative  Negative  Anti-U3 RNP (Fibrillarin)(RDL) Latest Ref Range: Negative  Negative  SSA (Ro) (ENA) Antibody, IgG Latest Ref Range: <1.0 NEG AI <1.0 NEG  SSB (La) (ENA) Antibody, IgG Latest Ref Range: <1.0 NEG AI <1.0 NEG  Scleroderma (Scl-70) (ENA) Antibody, IgG Latest Ref Range: <1.0 NEG AI <1.0 NEG  Results for MERAL, GEISSINGER (MRN [id number]) as of 04/01/2021 14:02  Ref. Range 08/13/2020 14:48  Sheep Sorrel IgE Latest Units: kU/L <0.10  Pecan/Hickory Tree IgE Latest Units: kU/L <0.10  IgE (Immunoglobulin E), Serum Latest Ref Range: <OR=114 kU/L 110  Allergen, D pternoyssinus,d7 Latest Units: kU/L <0.10  Cat Dander Latest Units: kU/L <0.10  Dog Dander Latest Units: kU/L <0.10  [place] Grass Latest Units: kU/L <0.10  Johnson Grass Latest Units: kU/L <0.10  Timothy Grass Latest Units: kU/L <0.10  Cockroach Latest Units: kU/L <0.10  Aspergillus fumigatus, m3 Latest Units: kU/L <0.10  Allergen, Comm Silver Wendee Copp, t9 Latest Units: kU/L <0.10  Allergen, Cottonwood, t14 Latest Units: kU/L <0.10  Elm IgE Latest Units: kU/L <0.10  Allergen, Mulberry, t76 Latest Units: kU/L <0.10  Allergen, Oak,t7 Latest Units: kU/L <0.10  COMMON RAGWEED (SHORT) (W1) IGE Latest Units: kU/L 0.34 (H)  Allergen, Mouse  Urine Protein, e78 Latest Units: kU/L <0.10  D. farinae Latest Units: kU/L <0.10  Allergen, Cedar tree, t12 Latest Units: kU/L <0.10  Box Elder IgE Latest Units: kU/L <0.10  Rough Pigweed  IgE Latest Units: kU/L <0.10  A-1 Antitrypsin, Ser Latest Ref Range: 83 - 199 mg/dL 217 (H)    OV 09/02/2021  Subjective:  Patient ID: Lacey Erickson, female , DOB: [birth year] , age 63 y.o. , MRN: [id number] , ADDRESS: [place] [address] PCP Maris Berger, MD Patient Care Team: Maris Berger, MD as PCP - General (Family Medicine)  This Provider for this visit: Treatment Team:  Attending Provider: Brand Males, MD    09/02/2021 -   Chief Complaint  Patient presents with   Follow-up    Pt is here to discuss results of recent CT. Pt states she still has SOB with exertion.   Interstitial lung disease in the setting of heavy smoking, African bird exposure, acid reflux and seronegative rheumatoid arthritis  Morbid obesity BMI 40.68.  Heavy smoking  HPI Lacey Erickson 46 y.o. -returns for follow-up to discuss results of her work-up.  Review of the chart indicates on 06/14/2021 she saw Tana Coast at [facility] rheumatology physician assistant.  He started on sulfasalazine 1 tablet 5 mg twice daily per chart review.  Is diagnosed with rheumatoid arthritis involving both ankles with positive rheumatoid factor.  He noted that patient did see Dr. Eliberto Ivory at [facility] several years ago.  At that point in time when he had some seen her in August 2022 she was off disease modifying agents.  He decided to treat with sulfasalazine.  For further work-up she did have a high-resolution CT scan of the chest -I personally visualized this and agree that there is alternate pattern with more like groundglass opacities in the lung bases.  It fits with a DIP pattern.  She did give me history that she smokes heavily.  She is try to cut down.  The room is smelling of tobacco nevertheless.  She smokes a pack and a half a day.  She also tells me that she has African gray birds for the last 2 years.  There are 2 of them there is a right outside the bedroom she has to walk past these on a daily basis although her husband is 1 that cleans the cage.  She was on lisinopril but she stopped this.  There is a slight limp with  the cough.  She is acid reflux history and rheumatoid arthritis.  Other incidental findings in the CT scan -Mild enlargement of the pulmonary artery - Right upper lobe micronodules multiple. -No coronary artery calcification but she has atherosclerosis. -Mild emphysema  She continues to have significant symptomatology.   CT Chest data - HRCT 05/11/21  Narrative & Impression  CLINICAL DATA:  46 year old female with history of shortness of breath. On oxygen at night.   EXAM: CT CHEST WITHOUT CONTRAST   TECHNIQUE: Multidetector CT imaging of the chest was performed following the standard protocol without intravenous contrast. High resolution imaging of the lungs, as well as inspiratory and expiratory imaging, was performed.   COMPARISON:  Chest CT 08/27/2020.   FINDINGS: Cardiovascular: Heart size is normal. There is no significant pericardial fluid, thickening or pericardial calcification. Aortic atherosclerosis. No definite coronary artery calcifications. 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 Pulmonic trunk is mildly dilated measuring 3.5 cm  in diameter.   Mediastinum/Nodes: No pathologically enlarged mediastinal or hilar lymph nodes. Please note that accurate exclusion of hilar adenopathy is limited on noncontrast CT scans. Esophagus is unremarkable in appearance. No axillary lymphadenopathy.   Lungs/Pleura: High-resolution images demonstrate generalized ground-glass attenuation in some very mild interlobular septal thickening most evident throughout the mid to lower lungs bilaterally. No subpleural reticulation, traction bronchiectasis or honeycombing. Inspiratory and expiratory imaging demonstrates some mild air trapping indicative of mild small airways disease. There also some more focal areas of ground-glass attenuation which are somewhat nodular in appearance, most notably in the  periphery of the right lower lobe demonstrated on axial image 75 of series 9 where the largest of these regions measures up to 1.1 x 1.0 cm. No acute consolidative airspace disease. No pleural effusions. Diffuse bronchial wall thickening with moderate centrilobular and paraseptal emphysema. Small pulmonary nodules are noted measuring up to 5 mm in the right upper lobe (axial image 40 of series 9).   Upper Abdomen: Diffuse low attenuation throughout the visualized hepatic parenchyma, indicative of a background of hepatic steatosis.   Musculoskeletal: Chronic appearing compression fracture of L1 with 25% loss of anterior vertebral body height and post vertebroplasty changes. There are no aggressive appearing lytic or blastic lesions noted in the visualized portions of the skeleton.   IMPRESSION: 1. There are findings in the lungs concerning for interstitial lung disease, with a spectrum considered most compatible with an alternative diagnosis (not usual interstitial pneumonia). Specifically, findings are concerning for potential disclaimer of interstitial pneumonia (DIP). Repeat high-resolution chest CT is recommended in 12 months to assess for temporal changes in the appearance of the lung parenchyma. 2. There is also diffuse bronchial wall thickening with moderate centrilobular and paraseptal emphysema; imaging findings suggestive of underlying COPD. 3. Mild dilatation of the pulmonic trunk (3.5 cm in diameter), concerning for associated pulmonary arterial hypertension. 4. Small pulmonary nodules in the lungs measuring up to 5 mm in the right upper lobe (axial image 40 of series 9). These are nonspecific, but attention at time of repeat high-resolution chest CT is recommended to ensure stability. This recommendation follows the consensus statement: Guidelines for Management of Incidental Pulmonary Nodules Detected on CT Images: From the Fleischner Society 2017; Radiology 2017;  284:228-243. 5. Hepatic steatosis. 6. Aortic atherosclerosis. 7. 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   Aortic Atherosclerosis (ICD10-I70.0) and Emphysema (ICD10-J43.9).     Electronically Signed   By: Vinnie Langton M.D.   On: 05/12/2021 07:26      No results found.       OV 11/11/2021  Subjective:  Patient ID: Lacey Erickson, female , DOB: December 23, 1975 , age 74 y.o. , MRN: [id number] , ADDRESS: [place] [id number] PCP Maris Berger, MD Patient Care Team: Maris Berger, MD as PCP - General (Family Medicine)  This Provider for this visit: Treatment Team:  Attending Provider: Brand Males, MD    11/11/2021 -   Chief Complaint  Patient presents with   Follow-up    Pt states she has been doing okay since last visit. States she has been having pain from where procedure was performed by Dr. Roxan Hockey. States her breathing is about the same.   Interstitial lung disease in the setting of heavy smoking, African bird exposure, acid reflux and seronegative rheumatoid arthritis  Morbid obesity BMI 40.68.  Heavy smoking  HPI Lacey Erickson 46 y.o. -here to discuss surgical lung biopsy  results.  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  Overall surgery is going well but she is dealing with significant amount of incisional chest pain in the area of the incision.  She is known to have chronic opiate dependent pain and also on gabapentin.  She sees a pain specialist.  I reviewed the notes and I align with what she is saying that Dr. Roxan Hockey has asked her to see a pain specialist.  She already has a spine specialist and she is sees the pain specialist tomorrow.  We discussed the fact she has S RIF.  Advised her that this is now irreversible.  However also cautioned that this is very progressive.  Did indicate to her former patient died 4  years after diagnosis despite quitting smoking.  Her?  Husband asked about another friend of theirs who is 30 years old is a non-smoker with pulmonary fibrosis was being given months to live.  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  Differentiating between IPF and non-IPF varieties to him and the patient.  Did explain that non-- IPF varieties though less progressive can still be very progressive particularly S RIF.  Indicated the importance of quitting smoking.  We discussed Zyban/Wellbutrin she has never had this but she is on antidepressants.  She has had previous suicide attempts.  Therefore I have advised her to discuss this medication with psychiatrist.  We did discuss Chantix but she said she she tried this before and it "did not work".    SYMPTOM SCALE - ILD 04/01/2021  09/02/2021 Stopped lisinpriol ? On simponi 11/11/2021 Post SL:B  O2 use *ra Ra  ra  Shortness of Breath 0 -> 5 scale with 5 being worst (score 6 If unable to do)    At rest 0 3 2  Simple tasks - showers, clothes change, eating, shaving 5 3 3   Household (dishes, doing bed, laundry) 6 3 5   Shopping 3 5 4   Walking level at own pace 5 3 4   Walking up Stairs 6 5 5   Total (30-36) Dyspnea Score 25 ,x 6 eyear and getting worse.  21 23  How bad is your cough?  5 - Since 2013, Worse over time  Worse lying down, clears throat,  4 4  How bad is your fatigue 5 5 6   How bad is nausea 5 5 2   How bad is vomiting?  3 5 2   How bad is diarrhea? 3 5 0  How bad is anxiety? 5 5 4   How bad is depression 5 5 4       Simple office walk 185 feet x  3 laps goal with forehead probe 04/01/2021  09/02/2021   O2 used ra ra  Number laps completed 3 Stopped after 1 of 3 laps  Comments about pace nmdoe avg  Resting Pulse Ox/HR 97% and 78/min 97% and 74  Final Pulse Ox/HR 90% and 112/min 95% nd 92  Desaturated </= 88% no no  Desaturated <= 3% points yes no  Got Tachycardic >/= 90/min yes yes  Symptoms at end of test x Moderate - severe dyspnea   Miscellaneous comments 1 stop Stopped at 1 lap    ECHO 10/11/21  IMPRESSIONS     1. Left ventricular ejection fraction, by estimation, is 60 to 65%. The  left ventricle has normal function. The left ventricle has no regional  wall motion abnormalities. Left ventricular diastolic parameters were  normal.   2. Right ventricular systolic function is normal. The  right ventricular  size is normal.   3. Left atrial size was mildly dilated.   4. The mitral valve is abnormal. Trivial mitral valve regurgitation. No  evidence of mitral stenosis.   5. The aortic valve is tricuspid. Aortic valve regurgitation is not  visualized. No aortic stenosis is present.   6. The inferior vena cava is normal in size with greater than 50%  respiratory variability, suggesting right atrial pressure of 3 mmHg.     PFT  PFT Results Latest Ref Rng & Units 04/01/2021  FVC-Predicted Pre % 67  Pre FEV1/FVC % % 82  FEV1-Pre L 2.08  FEV1-Predicted Pre % 69  DLCO uncorrected ml/min/mmHg 8.98  DLCO UNC% % 40  DLCO corrected ml/min/mmHg 8.93  DLCO COR %Predicted % 40  DLVA Predicted % 54   Surgical Lung Pathology 10/15/21   SURGICAL PATHOLOGY  CASE: [id number]  PATIENT: [facility]  Surgical Pathology Report      Clinical History: Interstitial lung disease (nt)      FINAL MICROSCOPIC DIAGNOSIS:   A. LUNG WEDGE, RIGHT UPPER LOBE, RESECTION:  - Consistent with smoking-related interstitial fibrosis (SRIF). See  comment   B. LUNG WEDGE, RIGHT LOWER LOBE, RESECTION:  - Consistent with smoking-related interstitial fibrosis (SRIF). See  comment  - Focal evidence of overlapping chronic hemorrhage   C. LUNG WEDGE, RIGHT MIDDLE LOBE, RESECTION:  - Consistent with smoking-related interstitial fibrosis (SRIF). See  comment       COMMENT:   There is uniform appearing interstitial fibrosis, which is dense,  hyaline consisting of ropy collagen. It shows more or less diffuse  involvement,  including both a subpleural and centrilobular distribution.  The degree of interstitial inflammation is minimal to mild. No  significant fibroblast foci or honeycomb changes (typically seen in UIP)  are noted. Granulomas or increased eosinophils are not identified. There  are numerous smoker's intraalveolar macrophages, consistent with  respiratory bronchiolitis, commonly seen in smokers. Additionally, there  are some foci with hemosiderin-containing macrophages (confirmed with  iron stain), consistent with chronic hemorrhage. There is no evidence of  capillaritis or vasculitis.  The interstitial process is most consistent  with smoking-related interstitial fibrosis (SRIF).    has a past medical history of Anxiety and depression, Bipolar disorder ([place]), Cervical cancer ([place]), Chronic respiratory failure ([place]), COPD (chronic obstructive pulmonary disease) ([place]), Diabetes mellitus without complication ([place]), Diverticulitis, Dyspnea, Elevated LFTs, Fibromyalgia, GERD (gastroesophageal reflux disease), Hepatic steatosis, History of kidney stones, Hyperlipemia, Hypertension, Ileus ([place]), Osteopenia, Oxygen dependent, and RHA (rheumatoid arthritis) ([place]).   reports that she has been smoking cigarettes. She started smoking about 35 years ago. She has a 96.00 pack-year smoking history. She has never used smokeless tobacco.  Past Surgical History:  Procedure Laterality Date   APPENDECTOMY     BACK SURGERY  07/2018   BREAST LUMPECTOMY Left    Benign   CHOLECYSTECTOMY     ESOPHAGOGASTRODUODENOSCOPY  02/25/2016   Small hiatal hernia. Retained food in the stomach- no evidence of gastric outlet obstruction.    FINGER SURGERY  03/2021   right index   INTERCOSTAL NERVE BLOCK Right 10/15/2021   Procedure: INTERCOSTAL NERVE BLOCK;  Surgeon: Melrose Nakayama, MD;  Location: [facility];  Service: Thoracic;  Laterality: Right;   LUNG BIOPSY Right 10/15/2021   Procedure: LUNG BIOPSY;  Surgeon: Melrose Nakayama, MD;  Location: [facility];  Service: Thoracic;  Laterality: Right;   NASAL SEPTUM SURGERY  2021   TOTAL ABDOMINAL HYSTERECTOMY W/ BILATERAL SALPINGOOPHORECTOMY  Cervical Cancer    Allergies  Allergen Reactions   Adalimumab Other (See Comments)    Cervical Cancer Humira   Baclofen Other (See Comments)    Made pt not feel herself   Etanercept Other (See Comments)    Enbrel - pt does not recall reaction    Plaquenil [Hydroxychloroquine]     alopecia   Topiramate Other (See Comments)    Numbness and tingling     Armodafinil Palpitations    Immunization History  Administered Date(s) Administered   Influenza Split 07/16/2018, 06/25/2020   Influenza,inj,Quad PF,6+ Mos 09/03/2020, 08/07/2021   PFIZER(Purple Top)SARS-COV-2 Vaccination 02/13/2020, 03/09/2020    Family History  Problem Relation Age of Onset   Thyroid disease Mother    Hypertension Mother    Diabetes Mother    Other Father        Polio, encephalitis   Colon cancer Neg Hx    Stomach cancer Neg Hx    Rectal cancer Neg Hx    Esophageal cancer Neg Hx      Current Outpatient Medications:    albuterol (VENTOLIN HFA) 108 (90 Base) MCG/ACT inhaler, Inhale 2 puffs into the lungs every 6 (six) hours as needed for wheezing or shortness of breath., Disp: , Rfl:    amLODipine (NORVASC) 5 MG tablet, Take 1 tablet (5 mg total) by mouth daily., Disp: 30 tablet, Rfl: 3   cyclobenzaprine (FLEXERIL) 10 MG tablet, Take 10 mg by mouth 2 (two) times daily as needed for muscle spasms., Disp: , Rfl:    fluvoxaMINE (LUVOX) 100 MG tablet, Take 100 mg by mouth 2 (two) times daily., Disp: , Rfl:    gabapentin (NEURONTIN) 800 MG tablet, Take 800 mg by mouth 3 (three) times daily., Disp: , Rfl:    hyoscyamine (LEVSIN SL) 0.125 MG SL tablet, PLACE 1-2 TABLET UNDER THE TONGUE EVERY 4 HOURS AS NEEDED FOR PAIN, Disp: 60 tablet, Rfl: 0   ondansetron (ZOFRAN ODT) 4 MG disintegrating tablet, Take 1 tablet (4 mg total) by mouth every 8  (eight) hours as needed for nausea or vomiting., Disp: 30 tablet, Rfl: 2   Oxcarbazepine (TRILEPTAL) 300 MG tablet, Take 300 mg by mouth in the morning, at noon, and at bedtime., Disp: , Rfl:    oxyCODONE-acetaminophen (PERCOCET) 10-325 MG tablet, Take 1 tablet by mouth 3 (three) times daily as needed for pain., Disp: , Rfl:    pantoprazole (PROTONIX) 40 MG tablet, Take 1 tablet (40 mg total) by mouth daily., Disp: 90 tablet, Rfl: 3   prazosin (MINIPRESS) 5 MG capsule, Take 5 mg by mouth at bedtime., Disp: , Rfl:    risperiDONE (RISPERDAL) 0.25 MG tablet, Take 0.25 mg by mouth 2 (two) times daily., Disp: , Rfl:    rizatriptan (MAXALT) 5 MG tablet, Take 5 mg by mouth as needed for migraine. May repeat in 2 hours if needed, Disp: , Rfl:    rosuvastatin (CRESTOR) 10 MG tablet, Take 10 mg by mouth at bedtime., Disp: , Rfl:    Semaglutide,0.25 or 0.5MG /DOS, (OZEMPIC, 0.25 OR 0.5 MG/DOSE,) 2 MG/1.5ML SOPN, Inject 0.25 mg into the skin every Thursday., Disp: , Rfl:    sulfaSALAzine (AZULFIDINE) 500 MG tablet, Take 500 mg by mouth 2 (two) times daily., Disp: , Rfl:    Tiotropium Bromide Monohydrate (SPIRIVA RESPIMAT) 2.5 MCG/ACT AERS, Inhale 2 puffs into the lungs daily., Disp: 4 g, Rfl: 5      Objective:   Vitals:   11/11/21 1110  BP: 126/74  Pulse: 86  Temp: 98.4 F (36.9 C)  TempSrc: Oral  SpO2: 98%  Weight: 216 lb (98 kg)  Height: 5\' 2"  (1.575 m)    Estimated body mass index is 39.51 kg/m as calculated from the following:   Height as of this encounter: 5\' 2"  (1.575 m).   Weight as of this encounter: 216 lb (98 kg).  @WEIGHTCHANGE @  [employer]   11/11/21 1110  Weight: 216 lb (98 kg)     Physical Exam  General: No distress. Obese .  Smells of tobacco Neuro: Alert and Oriented x 3. GCS 15. Speech normal Psych: Pleasant Resp:  Barrel Chest - no.  Wheeze - no, Crackles - yes, No overt respiratory distress CVS: Normal heart sounds. Murmurs - no Ext: Stigmata of Connective  Tissue Disease - no HEENT: Normal upper airway. PEERL +. No post nasal drip        Assessment:       ICD-10-CM   1. ILD (interstitial lung disease) ([place])  J84.9 Pulmonary function test    CT Chest High Resolution    2. Long-term exposure involving bird droppings  Z77.29     3. History of smoking greater than 50 pack years  Z87.891     4. Pulmonary emphysema, unspecified emphysema type ([place])  J43.9 Alpha-1 antitrypsin phenotype    Alpha-1 antitrypsin phenotype    5. Multiple pulmonary nodules determined by computed tomography of lung  R91.8     6. Chest wall pain following surgery  R07.89    G89.18          Plan:     Patient Instructions  ILD (interstitial lung disease) ([place]) Long-term exposure involving bird droppings History of smoking greater than 50 pack years Hx of RA  - the variety of pulmonary fibrosis that you have is called smoking-related interstitial fibrosis [S RIF] -This is permanent and can get worse if you do not quit smoking -Appears in the past you have tried Chantix and it did not work for you  Plan  - Quit smoking -Wellbutrin is an option [given the fact you have depression and previous suicide attempt and see a psychiatrist and on many psychiatric medications best this is coordinated through a psychiatrist] -Still you should get rid of the bird -Do spirometry and DLCO in 3 months -We will discuss  case conference your pathology   Pulmonary emphysema, unspecified emphysema type ([place])  -You do have some mild associated emphysema seen on CT scan of the chest  Plan - Start Spiriva once daily 2 puffs each time - Check alpha-1 antitrypsin phenotype   Multiple pulmonary nodules determined by computed tomography of lung -5 mm in the right upper lobe July 2022  Plan - Next high-resolution CT scan of the chest July 2023  Chest wall pain following surgery -  -You will have chronic pain and there is a risk factor for increased postoperative  incisional pain.  You are already on opioids and gabapentin  Plan - Please discuss with the pain doctor as to how best to control this pain   Follow-up - Return to see Dr. Chase Caller in 3 months for 30-minute visit but after breathing test.  Hopefully of quit smoking by then    SIGNATURE    Dr. Brand Males, M.D., F.C.C.P,  Pulmonary and Critical Care Medicine Staff Physician, [place] Director - Interstitial Lung Disease  Program  [facility] [place] [facility] [place], [place], [address]  Pager: [phone], If  no answer or between  15:00h - 7:00h: call [phone] Telephone: [phone]  5:37 PM 11/11/2021

## 2021-11-19 LAB — ALPHA-1 ANTITRYPSIN PHENOTYPE: A-1 Antitrypsin, Ser: 239 mg/dL — ABNORMAL HIGH (ref 83–199)

## 2021-11-20 ENCOUNTER — Other Ambulatory Visit: Payer: Self-pay | Admitting: Physician Assistant

## 2021-11-22 ENCOUNTER — Other Ambulatory Visit: Payer: Self-pay | Admitting: Physician Assistant

## 2021-12-16 ENCOUNTER — Telehealth: Payer: Self-pay

## 2021-12-16 NOTE — Telephone Encounter (Signed)
Left message on machine to call back  

## 2021-12-16 NOTE — Telephone Encounter (Signed)
-----   Message from Timothy Lasso, RN sent at 07/16/2021  9:22 AM EDT ----- Chong Sicilian, please schedule this patient a colonoscopy with EUS/EMR 90-minute slot for 3 to 6 months with me. If the patient wants to see me in clinic to discuss things I am happy to otherwise okay for direct procedure.  Thanks.  GM

## 2021-12-17 NOTE — Telephone Encounter (Signed)
The pt has been scheduled for 01/06/22 at 3:50 pm appt with GM to discuss Colon EUS EMR.  Office visit appt was per pt request.

## 2021-12-17 NOTE — Telephone Encounter (Signed)
Left message on machine to call back  

## 2021-12-17 NOTE — Telephone Encounter (Signed)
Inbound call from patients husband, stated that he wanted to make appointment for her to be seen and was not aware of the Vm that was sent to her yesterday. Please return call.

## 2022-01-06 ENCOUNTER — Encounter: Payer: Self-pay | Admitting: Gastroenterology

## 2022-01-06 ENCOUNTER — Other Ambulatory Visit (INDEPENDENT_AMBULATORY_CARE_PROVIDER_SITE_OTHER): Payer: Medicare Other

## 2022-01-06 ENCOUNTER — Ambulatory Visit (INDEPENDENT_AMBULATORY_CARE_PROVIDER_SITE_OTHER): Payer: Medicare Other | Admitting: Gastroenterology

## 2022-01-06 ENCOUNTER — Other Ambulatory Visit: Payer: Self-pay | Admitting: Physician Assistant

## 2022-01-06 VITALS — BP 118/62 | HR 64 | Ht 62.0 in | Wt 201.0 lb

## 2022-01-06 DIAGNOSIS — K319 Disease of stomach and duodenum, unspecified: Secondary | ICD-10-CM | POA: Diagnosis not present

## 2022-01-06 DIAGNOSIS — R933 Abnormal findings on diagnostic imaging of other parts of digestive tract: Secondary | ICD-10-CM

## 2022-01-06 DIAGNOSIS — R6881 Early satiety: Secondary | ICD-10-CM

## 2022-01-06 DIAGNOSIS — R109 Unspecified abdominal pain: Secondary | ICD-10-CM

## 2022-01-06 DIAGNOSIS — R112 Nausea with vomiting, unspecified: Secondary | ICD-10-CM

## 2022-01-06 DIAGNOSIS — R1030 Lower abdominal pain, unspecified: Secondary | ICD-10-CM

## 2022-01-06 DIAGNOSIS — D126 Benign neoplasm of colon, unspecified: Secondary | ICD-10-CM

## 2022-01-06 LAB — PROTIME-INR
INR: 0.9 ratio (ref 0.8–1.0)
Prothrombin Time: 10.4 s (ref 9.6–13.1)

## 2022-01-06 LAB — CBC
HCT: 46.3 % — ABNORMAL HIGH (ref 36.0–46.0)
Hemoglobin: 15.1 g/dL — ABNORMAL HIGH (ref 12.0–15.0)
MCHC: 32.6 g/dL (ref 30.0–36.0)
MCV: 81.8 fl (ref 78.0–100.0)
Platelets: 316 10*3/uL (ref 150.0–400.0)
RBC: 5.66 Mil/uL — ABNORMAL HIGH (ref 3.87–5.11)
RDW: 20.9 % — ABNORMAL HIGH (ref 11.5–15.5)
WBC: 13.2 10*3/uL — ABNORMAL HIGH (ref 4.0–10.5)

## 2022-01-06 LAB — CORTISOL: Cortisol, Plasma: 24.2 ug/dL

## 2022-01-06 MED ORDER — NA SULFATE-K SULFATE-MG SULF 17.5-3.13-1.6 GM/177ML PO SOLN: 1.0000 | ORAL | 0 refills | Status: DC

## 2022-01-06 NOTE — Patient Instructions (Signed)
Your provider has requested that you go to the basement level for lab work before leaving today. Press "B" on the elevator. The lab is located at the first door on the left as you exit the elevator. ? ?You have been scheduled for a colonoscopy. Please follow written instructions given to you at your visit today.  ?Please pick up your prep supplies at the pharmacy within the next 1-3 days. ?If you use inhalers (even only as needed), please bring them with you on the day of your procedure. ? ?We have sent the following medications to your pharmacy for you to pick up at your convenience: ?[place]  ? ?Discuss with your primary care about the possibly of decreasing your Ozempic. ? ?Discuss with Dr.Gupta about possibly having a Gastric Emptying Study.  ? ?You have been scheduled for a CT scan of the abdomen and pelvis at [facility]. You are scheduled on 01/11/22  at 10:30am.  The solution may taste better if refrigerated, but do NOT add ice or any other liquid to this solution. Shake well before drinking.  ? ?Please follow the written instructions below on the day of your exam:  ? ?1) Do not eat anything after 6:30am (4 hours prior to your test)  ? ?2) Drink 1 bottle of contrast @ 8:30am (2 hours prior to your exam)  Remember to shake well before drinking and do NOT pour over ice. ?    Drink 1 bottle of contrast @ 9:30am (1 hour prior to your exam)  ? ?You may take any medications as prescribed with a small amount of water, if necessary. If you take any of the following medications: METFORMIN, GLUCOPHAGE, GLUCOVANCE, AVANDAMET, RIOMET, FORTAMET, ACTOPLUS MET, JANUMET, [place] or METAGLIP, you MAY be asked to HOLD this medication 48 hours AFTER the exam.  ? ?The purpose of you drinking the oral contrast is to aid in the visualization of your intestinal tract. The contrast solution may cause some diarrhea. Depending on your individual set of symptoms, you may also receive an intravenous injection of x-ray  contrast/dye. Plan on being at [facility] for 45 minutes or longer, depending on the type of exam you are having performed.  ? ?If you have any questions regarding your exam or if you need to reschedule, you may call [facility] at [phone] between the hours of 8:00 am and 5:00 pm, Monday-Friday.  ? ? ?Thank you for choosing me and [facility]. ? ?Dr. Rush Landmark ? ?

## 2022-01-06 NOTE — Progress Notes (Signed)
[facility] VISIT   Primary Care Provider Maris Berger, MD [employer] [place] [address] [phone]  Referring Provider Dr. Lyndel Safe  Patient Profile: Lacey Erickson is a 46 y.o. female with a pmh significant for hypertension, hyperlipidemia, fibromyalgia, COPD, diabetes, rheumatoid arthritis, nephrolithiasis, status postcholecystectomy, status post appendectomy, GERD, colon polyps, diverticulosis, chronic nausea/vomiting.  The patient presents to the [facility] for an evaluation and management of problem(s) noted below:  Problem List 1. Serrated adenoma of colon   2. Subepithelial lesion of colon   3. Abnormal colonoscopy   4. Nausea and vomiting, unspecified vomiting type   5. Abdominal cramping   6. Lower abdominal pain   7. Early satiety     History of Present Illness Please see prior notes by Dr. Lyndel Safe for full details of HPI.  Interval History The patient underwent an EGD/colonoscopy in August 2022.  This was for work-up of abdominal pain and nausea and vomiting and colon cancer screening.  Patient was found to have a subepithelial lesion versus extrinsic impression in the right colon.  This was biopsied.  Did return showing evidence of a serrated adenoma.  It is for this reason that the patient is referred for consideration and of advanced resection.  The patient has a longstanding history of nausea and vomiting.  When I see her today with her husband, she is having active nausea and is vomiting.  She has been taking her Zofran.  However over the course of the last few months she has been initiated on Ozempic.  She has increased her dose to 0.5 weekly.  When she transition from 0.25 to 0.5 weekly is when she had even more progressive nausea and vomiting.  She has had weight loss however and she is happy with that.  She wishes she did not have underlying nausea on a regular basis however.  She is having lower  abdominal pain.  She has had some slight alteration of her bowel habits.  She is normally constipated but more recently has had loose bowel movements.  This fluctuates at times.  It does not improve her abdominal discomfort when she has a bowel movement.  She denies any melena or hematochezia.  She states that she has previously had a gastric emptying study in [place] but it was negative years ago.  She denies any hematemesis or coffee-ground emesis.  GI Review of Systems Positive as above including pyrosis at times, bloating Negative for dysphagia, odynophagia  Review of Systems General: Denies fevers/chills Cardiovascular: Denies chest pain Pulmonary: Her shortness of breath is at baseline Gastroenterological: See HPI Genitourinary: Denies darkened urine Hematological: Denies easy bruising/bleeding Endocrine: Denies temperature intolerance Dermatological: Denies jaundice Psychological: Mood is stable  Medications Current Outpatient Medications  Medication Sig Dispense Refill   Na Sulfate-K Sulfate-Mg Sulf (SUPREP BOWEL PREP KIT) 17.5-3.13-1.6 GM/177ML SOLN Take 1 kit by mouth as directed. For colonoscopy prep 354 mL 0   albuterol (VENTOLIN HFA) 108 (90 Base) MCG/ACT inhaler Inhale 2 puffs into the lungs every 6 (six) hours as needed for wheezing or shortness of breath.     amLODipine (NORVASC) 5 MG tablet Take 1 tablet (5 mg total) by mouth daily. 30 tablet 3   cyclobenzaprine (FLEXERIL) 10 MG tablet Take 10 mg by mouth 2 (two) times daily as needed for muscle spasms.     fluvoxaMINE (LUVOX) 100 MG tablet Take 100 mg by mouth 2 (two) times daily.     gabapentin (NEURONTIN)  800 MG tablet Take 800 mg by mouth 3 (three) times daily.     hyoscyamine (LEVSIN SL) 0.125 MG SL tablet PLACE 1-2 TABLET UNDER THE TONGUE EVERY 4 HOURS AS NEEDED FOR PAIN 60 tablet 0   ondansetron (ZOFRAN ODT) 4 MG disintegrating tablet Take 1 tablet (4 mg total) by mouth every 8 (eight) hours as needed for nausea or  vomiting. 30 tablet 2   Oxcarbazepine (TRILEPTAL) 300 MG tablet Take 300 mg by mouth in the morning, at noon, and at bedtime.     oxyCODONE-acetaminophen (PERCOCET) 10-325 MG tablet Take 1 tablet by mouth 3 (three) times daily as needed for pain.     pantoprazole (PROTONIX) 40 MG tablet Take 1 tablet (40 mg total) by mouth daily. 90 tablet 3   prazosin (MINIPRESS) 5 MG capsule Take 5 mg by mouth at bedtime.     risperiDONE (RISPERDAL) 0.25 MG tablet Take 0.25 mg by mouth 2 (two) times daily.     rizatriptan (MAXALT) 5 MG tablet Take 5 mg by mouth as needed for migraine. May repeat in 2 hours if needed     rosuvastatin (CRESTOR) 10 MG tablet Take 10 mg by mouth at bedtime.     Semaglutide,0.25 or 0.5MG/DOS, (OZEMPIC, 0.25 OR 0.5 MG/DOSE,) 2 MG/1.5ML SOPN Inject 0.25 mg into the skin every Thursday.     sulfaSALAzine (AZULFIDINE) 500 MG tablet Take 500 mg by mouth 2 (two) times daily.     Tiotropium Bromide Monohydrate (SPIRIVA RESPIMAT) 2.5 MCG/ACT AERS Inhale 2 puffs into the lungs daily. 4 g 5   No current facility-administered medications for this visit.    Allergies Allergies  Allergen Reactions   Adalimumab Other (See Comments)    Cervical Cancer Humira   Baclofen Other (See Comments)    Made pt not feel herself   Etanercept Other (See Comments)    Enbrel - pt does not recall reaction    Plaquenil [Hydroxychloroquine]     alopecia   Topiramate Other (See Comments)    Numbness and tingling     Armodafinil Palpitations    Histories Past Medical History:  Diagnosis Date   Anxiety and depression    Bipolar disorder ([place])    Cervical cancer ([place])    Chronic respiratory failure (HCC)    COPD (chronic obstructive pulmonary disease) (HCC)    Diabetes mellitus without complication (HCC)    Diverticulitis    Dyspnea    Elevated LFTs    Fibromyalgia    GERD (gastroesophageal reflux disease)    Hepatic steatosis    History of kidney stones    Hyperlipemia    Hypertension     Ileus (HCC)    Osteopenia    Oxygen dependent    RHA (rheumatoid arthritis) ([place])    Past Surgical History:  Procedure Laterality Date   APPENDECTOMY     BACK SURGERY  07/2018   BREAST LUMPECTOMY Left    Benign   CHOLECYSTECTOMY     ESOPHAGOGASTRODUODENOSCOPY  02/25/2016   Small hiatal hernia. Retained food in the stomach- no evidence of gastric outlet obstruction.    FINGER SURGERY  03/2021   right index   INTERCOSTAL NERVE BLOCK Right 10/15/2021   Procedure: INTERCOSTAL NERVE BLOCK;  Surgeon: Melrose Nakayama, MD;  Location: [facility];  Service: Thoracic;  Laterality: Right;   LUNG BIOPSY Right 10/15/2021   Procedure: LUNG BIOPSY;  Surgeon: Melrose Nakayama, MD;  Location: [facility];  Service: Thoracic;  Laterality: Right;   NASAL SEPTUM  SURGERY  2021   TOTAL ABDOMINAL HYSTERECTOMY W/ BILATERAL SALPINGOOPHORECTOMY     Cervical Cancer   Social History   Socioeconomic History   Marital status: Married    Spouse name: Not on file   Number of children: 3   Years of education: Not on file   Highest education level: Not on file  Occupational History   Not on file  Tobacco Use   Smoking status: Every Day    Packs/day: 3.00    Years: 32.00    Pack years: 96.00    Types: Cigarettes    Start date: 1988   Smokeless tobacco: Never   Tobacco comments:    currently smoking 1ppd as of 11/11/21 ep  Vaping Use   Vaping Use: Never used  Substance and Sexual Activity   Alcohol use: Not Currently   Drug use: Never   Sexual activity: Not on file  Other Topics Concern   Not on file  Social History Narrative   Not on file   Social Determinants of Health   Financial Resource Strain: Not on file  Food Insecurity: Not on file  Transportation Needs: Not on file  Physical Activity: Not on file  Stress: Not on file  Social Connections: Not on file  Intimate Partner Violence: Not on file   Family History  Problem Relation Age of Onset   Thyroid disease Mother    Hypertension  Mother    Diabetes Mother    Other Father        Polio, encephalitis   Colon cancer Neg Hx    Stomach cancer Neg Hx    Rectal cancer Neg Hx    Esophageal cancer Neg Hx    Inflammatory bowel disease Neg Hx    Liver disease Neg Hx    Pancreatic cancer Neg Hx    I have reviewed her medical, social, and family history in detail and updated the electronic medical record as necessary.    PHYSICAL EXAMINATION  BP 118/62    Pulse 64    Ht 5' 2" (1.575 m)    Wt 201 lb (91.2 kg)    BMI 36.76 kg/m  Wt Readings from Last 3 Encounters:  01/06/22 201 lb (91.2 kg)  11/11/21 216 lb (98 kg)  10/28/21 221 lb (100.2 kg)  GEN: Appears chronically ill, nontoxic, during the 20 minutes that we were in the room together she did not have any further vomiting, accompanied by husband PSYCH: Cooperative, without pressured speech EYE: Conjunctivae pink, sclerae anicteric ENT: MMM, without oral ulcers CV: Nontachycardic with RR and without R/Gs  RESP: No wheezing appreciated GI: NABS, soft, obese, rounded, TTP in the lower quadrants bilaterally, no rebound, mild volitional guarding present, unable to appreciate hepatosplenomegaly due to body habitus  MSK/EXT: Bilateral lower extremity edema SKIN: No jaundice NEURO:  Alert & Oriented x 3, no focal deficits   REVIEW OF DATA  I reviewed the following data at the time of this encounter:  GI Procedures and Studies  August 2022 EGD - Small transient hiatal hernia. - Gastritis. Biopsied.  August 2022 Colonoscopy - Medium-sized lipoma in the distal ascending colon. Biopsied. - Moderate right colonic diverticulosis - Non-bleeding internal hemorrhoids. - The examined portion of the ileum was normal.  Pathology Diagnosis 1. Surgical [P], gastric - ANTRAL AND OXYNTIC MUCOSA WITH SLIGHT CHRONIC INFLAMMATION. [place] NEGATIVE FOR HELICOBACTER PYLORI. - NO INTESTINAL METAPLASIA, DYSPLASIA OR CARCINOMA. 2. Surgical [P], colon, ascending, polyp  (1) - COLONIC MUCOSA WITH HYPEREMIA AND  FOCAL CHANGES SUGGESTIVE OF SESSILE SERRATED POLYP. - MULTIPLE ADDITIONAL LEVELS EXAMINED.  Laboratory Studies  Reviewed those in epic  Imaging Studies  No relevant studies to review   ASSESSMENT  Ms. Lippe is a 46 y.o. female with a pmh significant for hypertension, hyperlipidemia, fibromyalgia, COPD, diabetes, rheumatoid arthritis, nephrolithiasis, status postcholecystectomy, status post appendectomy, GERD, colon polyps, diverticulosis, chronic nausea/vomiting.  The patient is seen today for evaluation and management of:  1. Serrated adenoma of colon   2. Subepithelial lesion of colon   3. Abnormal colonoscopy   4. Nausea and vomiting, unspecified vomiting type   5. Abdominal cramping   6. Lower abdominal pain   7. Early satiety    The patient is hemodynamically stable.  Clinically however she is experiencing significant issues of nausea and vomiting and cramping and discomfort which seem to be longer standing issues.  With that being said to further work this up a CT abdomen/pelvis is reasonable next step.  I actually need the CT abdomen/pelvis to better define, the subepithelial lesion.  It is not clear to me that we will be able to get an endoscopic ultrasound to that particular area although an miniprobe can sometimes reach there.  I do not think that the lesion itself which has returned a serrated adenoma is actually a subepithelial lesion but rather what is overlying this.  I have seen this on a couple of occasions in the past as well.  My plan will be for a resection of the lesion most likely as a large cold piecemeal resection which is a reasonable approach for serrated adenomas.  If we can get the miniprobe to that area and if the CT scan does not define a true lipoma we will try to see if we can define the lesion with the mini probe.  I think the Ozempic medication which has helped her weight is probably causing her more significant issues  with nausea unfortunately.  There is also the chance that the patient may have developed gastroparesis in the course of the last few years even though her diabetes is under better control.  We need to think about these potential issues as well in the future.  I am going to do some other laboratory work-up to help with Dr. Lyndel Safe in further work-up/evaluation of this patient's chronic nausea/vomiting and abdominal discomfort.  We will obtain those laboratories today.  Cross-sectional imaging will be performed before upcoming colonoscopy with endoscopic ultrasound.  If the CT scan clearly defines a lipoma, we will just perform colonoscopy.  The risks and benefits of endoscopic evaluation were discussed with the patient; these include but are not limited to the risk of perforation, infection, bleeding, missed lesions, lack of diagnosis, severe illness requiring hospitalization, as well as anesthesia and sedation related illnesses.  The patient and/or family is agreeable to proceed.  All patient questions were answered to the best of my ability, and the patient agrees to the aforementioned plan of action with follow-up as indicated.   PLAN  Laboratories as outlined below CT abdomen/pelvis to further define patient's symptoms as well as for me as a preprocedural imaging study of the subepithelial lesion of the right colon Proceed with scheduling colonoscopy with EMR with EUS attempt and possible miniprobe use If patient has progressive symptoms or worsening or laboratories are very concerning patient may require emergency department evaluation Continue Zofran as you are prescribed Follow-up with Dr. Lyndel Safe for further work-up and evaluation of symptoms Touch base with primary care provider  to see if Ozempic can be decreased to 0.25 q. weekly Consider gastric emptying study   Orders Placed This Encounter  Procedures   Procedural/ Surgical Case Request: COLONOSCOPY WITH PROPOFOL, ENDOSCOPIC MUCOSAL RESECTION    CT Abdomen Pelvis W Contrast   CBC   Comp Met (CMET)   Lipase   TSH   Cortisol   C-reactive protein   INR/PT   Magnesium   Phosphorus   Ambulatory referral to Gastroenterology    New Prescriptions   NA SULFATE-K SULFATE-MG SULF (SUPREP BOWEL PREP KIT) 17.5-3.13-1.6 GM/177ML SOLN    Take 1 kit by mouth as directed. For colonoscopy prep   Modified Medications   No medications on file    Planned Follow Up No follow-ups on file.   Total Time in Face-to-Face and in Coordination of Care for patient including independent/personal interpretation/review of prior testing, medical history, examination, medication adjustment, communicating results with the patient directly, and documentation within the EHR is 35 minutes.   Justice Britain, MD [facility] Office # [phone]

## 2022-01-07 ENCOUNTER — Encounter: Payer: Self-pay | Admitting: Gastroenterology

## 2022-01-07 DIAGNOSIS — R109 Unspecified abdominal pain: Secondary | ICD-10-CM | POA: Insufficient documentation

## 2022-01-07 DIAGNOSIS — D126 Benign neoplasm of colon, unspecified: Secondary | ICD-10-CM | POA: Insufficient documentation

## 2022-01-07 DIAGNOSIS — K319 Disease of stomach and duodenum, unspecified: Secondary | ICD-10-CM | POA: Insufficient documentation

## 2022-01-07 DIAGNOSIS — R6881 Early satiety: Secondary | ICD-10-CM | POA: Insufficient documentation

## 2022-01-07 DIAGNOSIS — R1030 Lower abdominal pain, unspecified: Secondary | ICD-10-CM | POA: Insufficient documentation

## 2022-01-07 DIAGNOSIS — R112 Nausea with vomiting, unspecified: Secondary | ICD-10-CM | POA: Insufficient documentation

## 2022-01-07 DIAGNOSIS — R933 Abnormal findings on diagnostic imaging of other parts of digestive tract: Secondary | ICD-10-CM | POA: Insufficient documentation

## 2022-01-07 LAB — COMPREHENSIVE METABOLIC PANEL
ALT: 6 U/L (ref 0–35)
AST: 11 U/L (ref 0–37)
Albumin: 3.9 g/dL (ref 3.5–5.2)
Alkaline Phosphatase: 119 U/L — ABNORMAL HIGH (ref 39–117)
BUN: 7 mg/dL (ref 6–23)
CO2: 22 mEq/L (ref 19–32)
Calcium: 9 mg/dL (ref 8.4–10.5)
Chloride: 95 mEq/L — ABNORMAL LOW (ref 96–112)
Creatinine, Ser: 0.94 mg/dL (ref 0.40–1.20)
GFR: 72.78 mL/min (ref >60.00)
Glucose, Bld: 110 mg/dL — ABNORMAL HIGH (ref 70–99)
Potassium: 3.2 mEq/L — ABNORMAL LOW (ref 3.5–5.1)
Sodium: 135 mEq/L (ref 135–145)
Total Bilirubin: 0.3 mg/dL (ref 0.2–1.2)
Total Protein: 7 g/dL (ref 6.0–8.3)

## 2022-01-07 LAB — MAGNESIUM: Magnesium: 1.5 mg/dL (ref 1.5–2.5)

## 2022-01-07 LAB — LIPASE: Lipase: 9 U/L — ABNORMAL LOW (ref 11.0–59.0)

## 2022-01-07 LAB — TSH: TSH: 2.62 u[IU]/mL (ref 0.35–5.50)

## 2022-01-07 LAB — PHOSPHORUS: Phosphorus: 4 mg/dL (ref 2.3–4.6)

## 2022-01-07 LAB — C-REACTIVE PROTEIN: CRP: 1.6 mg/dL (ref 0.5–20.0)

## 2022-01-10 ENCOUNTER — Encounter: Payer: Self-pay | Admitting: Gastroenterology

## 2022-01-10 ENCOUNTER — Other Ambulatory Visit: Payer: Self-pay

## 2022-01-10 MED ORDER — POTASSIUM CHLORIDE CRYS ER 20 MEQ PO TBCR: 20.0000 meq | EXTENDED_RELEASE_TABLET | Freq: Every day | ORAL | 0 refills | Status: DC

## 2022-01-11 ENCOUNTER — Encounter (HOSPITAL_BASED_OUTPATIENT_CLINIC_OR_DEPARTMENT_OTHER): Payer: Self-pay

## 2022-01-11 ENCOUNTER — Ambulatory Visit (HOSPITAL_BASED_OUTPATIENT_CLINIC_OR_DEPARTMENT_OTHER)
Admission: RE | Admit: 2022-01-11 | Discharge: 2022-01-11 | Disposition: A | Discharge status: Home / Self Care | Payer: Medicare Other | Source: Ambulatory Visit | Attending: Gastroenterology | Admitting: Gastroenterology

## 2022-01-11 ENCOUNTER — Other Ambulatory Visit: Payer: Self-pay

## 2022-01-11 DIAGNOSIS — R109 Unspecified abdominal pain: Secondary | ICD-10-CM | POA: Insufficient documentation

## 2022-01-11 DIAGNOSIS — R933 Abnormal findings on diagnostic imaging of other parts of digestive tract: Secondary | ICD-10-CM | POA: Diagnosis present

## 2022-01-11 DIAGNOSIS — R112 Nausea with vomiting, unspecified: Secondary | ICD-10-CM | POA: Insufficient documentation

## 2022-01-11 MED ORDER — IOHEXOL 300 MG/ML  SOLN
100.0000 mL | Freq: Once | INTRAMUSCULAR | Status: AC | PRN
  Administered 2022-01-11: 100 mL via INTRAVENOUS

## 2022-01-13 ENCOUNTER — Other Ambulatory Visit: Payer: Self-pay | Admitting: Physician Assistant

## 2022-01-18 ENCOUNTER — Encounter: Payer: Self-pay | Admitting: Internal Medicine

## 2022-01-24 NOTE — Progress Notes (Signed)
? ?Interstitial Lung Disease Multidisciplinary Conference ?  ?Kiyani Jernigan    MRN [id number]    DOB 1976-03-21 ? ?Primary Care Physician:Whyte, Gildardo Griffes, MD ? ?Referring Physician: Chase Caller ? ?Time of Conference: 7.30am- 8.30am ?Date of conference: 01/18/22 ?Location of Conference: -  Virtual ? ?Participating ?Pulmonary: Dr. Brand Males, MD - yes,  Dr Marshell Garfinkel, MD - yes ?Pathology: Dr Jaquita Folds, MD - yes , ?Radiology: Dr Yetta Glassman ?Others: Laban Emperor ? ?Brief History: Has RA, smoking, bird . Has ILD. Discussion - what is the CT pattern. Patient did go  for SLB in Dec 2022. Path to bring bx and discuss ? ?Serology: neg ? ?MDD discussion of CT scan   ? ?- Date or time period of scan: "HRCT: 05/11/2021 ?" ? ?- Features mentioned:  ?- CT  - UL centrilobular emphysema and Diffuse GGO.  ?- What is the final conclusion per 2018 ATS/Fleischner Criteria - altenrative dx  ? ?Pathology discussion of biopsy "Accession: [id number] ?Collected: 10/15/2021":PATH -> Low magnficiation: can see alveolar spaces intact. Architecture is intact. Emphysema +.. There is uniform interst thickening and is pink which means hyaline fibrosis colalgen and lack of blue means no inflammation. No HC. No fibroblast focii.  ? ?PFTs:  ?PFT Results Latest Ref Rng & Units 04/01/2021  ?FVC-Predicted Pre % 67  ?Pre FEV1/FVC % % 82  ?FEV1-Pre L 2.08  ?FEV1-Predicted Pre % 69  ?DLCO uncorrected ml/min/mmHg 8.98  ?DLCO UNC% % 40  ?DLCO corrected ml/min/mmHg 8.93  ?DLCO COR %Predicted % 40  ?DLVA Predicted % 54  ? ? ? ? ? ?MDD Impression/Recs: SRIF with emphysema. Concordant path and CT read from pre and post conference ? ? ?Time Spent in preparation and discussion:  > 30 min ? ? ? ?SIGNATURE  ? ?Dr. Brand Males, M.D., F.C.C.P,  ?Pulmonary and Critical Care Medicine ?Staff Physician, [facility] ?Center Director - Interstitial Lung Disease  Program  ?[facility]  [facility] ?[place], [place], [address] ? ?Pager: [phone], If no answer or between  15:00h - 7:00h: call [phone] ?Telephone: [phone] ? ?10:13 AM ?01/24/2022 ?.................................................................................................................... ?References: ?Diagnosis of Hypersensitivity Pneumonitis in Adults. An Official ATS/JRS/ALAT Clinical Practice Guideline. ?Ragu G et al, [place] Aug 1;202(3):e36-e69. ? ? ? ? ? ? ?Diagnosis of Idiopathic Pulmonary Fibrosis. An Official ATS/ERS/JRS/ALAT Clinical Practice Guideline. ?Raghu G et al, [place] 2018 Sep 1;198(5):e44-e68. ? ? ?IPF Suspected   Histopath ology Pattern   ?   ?UIP  ?Probable UIP  ?Indeterminate for  ?UIP  ?Alternative  ?diagnosis  ?  ?UIP  ?IPF  ?IPF  ?IPF  ?Non-IPF dx  ? ?HRCT   ?Probabe UIP  ?IPF  ?IPF  ?IPF (Likely)**  ?Non-IPF dx  ?Pattern  ?Indeterminate for UIP  ?IPF  ?IPF (Likely)**  ?Indeterminate  ?for IPF**  ?Non-IPF dx  ?  ?Alternative diagnosis  ?IPF (Likely)**/ non-IPF dx  ?Non-IPF dx  ?Non-IPF dx  ?Non-IPF dx  ? ? ? ?Idiopathic pulmonary fibrosis diagnosis based upon HRCT and Biopsy paterns. ? ?** IPF is the likely diagnosis when any of following features are present: ? ?Moderate-to-severe traction bronchiectasis/bronchiolectasis (defined as mild traction bronchiectasis/bronchiolectasis in four or more lobes including the lingual as a lobe, or moderate to severe traction bronchiectasis in two or more lobes) in a man over age 45 years or in a woman over age 34 years ?Extensive (>  30%) reticulation on HRCT and an age >70 years  ?Increased neutrophils and/or absence of lymphocytosis in BAL fluid  ?Multidisciplinary discussion reaches a confident diagnosis of IPF.  ? ?**Indeterminate for IPF ? ?Without an adequate biopsy is unlikely to be IPF  ?With an adequate biopsy may be reclassified to a more specific diagnosis after multidisciplinary discussion and/or  additional consultation.  ? ?dx = diagnosis; HRCT = high-resolution computed tomography; IPF = idiopathic pulmonary fibrosis; UIP = usual interstitial pneumonia. ? ? ?

## 2022-02-10 ENCOUNTER — Ambulatory Visit: Payer: Medicare Other | Admitting: Internal Medicine

## 2022-02-15 ENCOUNTER — Encounter: Payer: Self-pay | Admitting: Gastroenterology

## 2022-02-15 ENCOUNTER — Ambulatory Visit (INDEPENDENT_AMBULATORY_CARE_PROVIDER_SITE_OTHER): Payer: Medicare Other | Admitting: Gastroenterology

## 2022-02-15 VITALS — BP 140/96 | HR 95 | Ht 62.0 in | Wt 188.0 lb

## 2022-02-15 DIAGNOSIS — K449 Diaphragmatic hernia without obstruction or gangrene: Secondary | ICD-10-CM

## 2022-02-15 DIAGNOSIS — K580 Irritable bowel syndrome with diarrhea: Secondary | ICD-10-CM

## 2022-02-15 DIAGNOSIS — R112 Nausea with vomiting, unspecified: Secondary | ICD-10-CM

## 2022-02-15 DIAGNOSIS — K760 Fatty (change of) liver, not elsewhere classified: Secondary | ICD-10-CM | POA: Diagnosis not present

## 2022-02-15 DIAGNOSIS — Z8719 Personal history of other diseases of the digestive system: Secondary | ICD-10-CM

## 2022-02-15 DIAGNOSIS — K219 Gastro-esophageal reflux disease without esophagitis: Secondary | ICD-10-CM

## 2022-02-15 MED ORDER — PANTOPRAZOLE SODIUM 40 MG PO TBEC: 40.0000 mg | DELAYED_RELEASE_TABLET | Freq: Every day | ORAL | 3 refills | Status: AC

## 2022-02-15 MED ORDER — ONDANSETRON 4 MG PO TBDP: 4.0000 mg | ORAL_TABLET | Freq: Three times a day (TID) | ORAL | 2 refills | Status: DC | PRN

## 2022-02-15 MED ORDER — HYOSCYAMINE SULFATE 0.125 MG SL SUBL
0.1250 mg | SUBLINGUAL_TABLET | Freq: Four times a day (QID) | SUBLINGUAL | 1 refills | Status: DC | PRN
Start: 2022-02-15 — End: 2022-03-10

## 2022-02-15 NOTE — Progress Notes (Signed)
? ? ?Chief Complaint: FU ? ?Referring Provider:  Dr Bea Graff    ? ? ?ASSESSMENT AND PLAN;  ? ?#1. N/V with epi pain likely d/t ozempic. Neg EGD 06/2021, CT AP with contrast 01/2022, H/O cholecystectomy 1999. Not keen on stopping ozempic. No pancreatitis. ? ?#2. H/O diverticulitis 10/2018 [facility]) and ileus 11/2018 ([facility]) ? ?#3. GERD with small HH. ? ?#4. IBS-D. (With post chole diarrhea) ? ?#5. Fatty liver. Has lost weight as below. Nl LFTs 01/2022 with Alb 3.6, plt 289K. No cirrhosis. ? ?Plan: ?- Solid-phase GES to r/o diabetic gastroparesis. ?- Protonix 40 mg p.o. once a day #30, 11 refills ?- Stool studies for GI Pathogen (includes C. Diff), giardia antigen, fecal elastase and Calprotectin. ?- Continue levsin 0.172m Q6hrs prn #120. ?- Continue Zofran ODT 4 mg every 8 hours as needed #30, 2 refills. ?- Stop smoking. ?- Proceed with colonoscopy with EMR (Dr. MRush Landmark, as scheduled ?- If diarrhea persists, trial of cholestyramine. ? ? ?HPI:   ? ?FSharonann Malbroughis a 46y.o. female  ?With ILD (on lung Bx 10/2021), COPD, COVID (June 2022, req O2, not now), DM2, RA on sulfasalazine, fibromyalgia, anxiety/depression ? ?For follow-up visit ? ?Still with N/V/dry heaves but somewhat better. EGD 06/2021 with neg except for gastritis. Neg Bx for HP. Prev SB Bx neg for celiac.  Likely d/t Ozempic which she is not willing to stop as it has helped with DM and wt loss.  Zofran does help with N/V. ? ?Diarrhea 3-4/day. No nocturnal symptoms.  Much better since she has stopped metformin.  No abdominal pain.  They use well water.  Not been tested lately.  Levsin helps. ? ?Underwent colonoscopy 06/2021 showing medium sized lipoma/lesion in the distal ascending colon.  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  She is scheduled for EMR with Dr. MRush Landmarkon May 1. ? ?Neg CT AP with contrast March 2023 ? ?Has been trying to lose weight on Ozempic. Wt 216lb (jan 2023) to 188 lb today. ? ?No sodas, chocolates, chewing gums, artificial sweeteners and  candy. No NSAIDs ? ?She continues to smoke. ? ?Wt Readings from Last 3 Encounters:  ?02/15/22 188 lb (85.3 kg)  ?01/06/22 201 lb (91.2 kg)  ?11/11/21 216 lb (98 kg)  ? ? ? ? ?Past GI work-up: ? ?Colon 07/01/2021 ?- Medium-sized lipoma/lesion in the distal ascending colon. Bx- SSA.  Scheduled for EMR with Dr. MRush Landmark ?- Moderate right colonic diverticulosis ?- Non-bleeding internal hemorrhoids. ?- The examined portion of the ileum was normal. ?- The examination was otherwise normal on direct ? ?EGD 07/01/2021 ?- Small transient hiatal hernia. ?- Gastritis. Bx- neg for HP ? ?CT Abdo/pelvis with contrast 01/2022 ?-No acute abnormalities ?-Focal fatty liver. ? ? ?-[facility] [place]  She had low potassium and low magnesium.  Managed conservatively by IV fluids, replacing electrolytes, minimizing pain medications and ambulating.  Did not get NG tube. ? ?-EGD 05/2018: small HH, retained food in the stomach without any gastric outlet obstruction.  Neg SB Bx for celiac in past. ? ?-CT AP 10/2018 ?No acute processes in abdomen/pelvis ?Fatty liver ?L1 compression deformity. ? ?Wt Readings from Last 3 Encounters:  ?02/15/22 188 lb (85.3 kg)  ?01/06/22 201 lb (91.2 kg)  ?11/11/21 216 lb (98 kg)  ? ? ? ?Past Medical History:  ?Diagnosis Date  ? Anxiety and depression   ? Bipolar disorder ([place]   ? Chronic respiratory failure ([place]   ?  Elevated LFTs   ? Fibromyalgia   ? GERD (gastroesophageal reflux disease)   ? Hepatic steatosis   ? Hyperlipemia   ?    ? RHA (rheumatoid arthritis) ([place])   ? ? ?Past Surgical History:  ?Procedure Laterality Date  ? APPENDECTOMY    ? BACK SURGERY  07/2018  ? BREAST LUMPECTOMY Left   ? Benign  ? CHOLECYSTECTOMY    ? ESOPHAGOGASTRODUODENOSCOPY  02/25/2016  ? Small hiatal hernia. Retained food in the stomach- no evidence of gastric outlet obstruction.   ? FINGER SURGERY  03/2021  ? right index  ? INTERCOSTAL NERVE BLOCK  Right 10/15/2021  ? Procedure: INTERCOSTAL NERVE BLOCK;  Surgeon: Melrose Nakayama, MD;  Location: [place];  Service: Thoracic;  Laterality: Right;  ? LUNG BIOPSY Right 10/15/2021  ? Procedure: LUNG BIOPSY;  Surgeon: Melrose Nakayama, MD;  Location: [place];  Service: Thoracic;  Laterality: Right;  ? NASAL SEPTUM SURGERY  2021  ? TOTAL ABDOMINAL HYSTERECTOMY W/ BILATERAL SALPINGOOPHORECTOMY    ? Cervical Cancer  ? ? ?Family History  ?Problem Relation Age of Onset  ? Thyroid disease Mother   ? Hypertension Mother   ? Diabetes Mother   ? Other Father   ?     Polio, encephalitis  ? Colon cancer Neg Hx   ? Stomach cancer Neg Hx   ? Rectal cancer Neg Hx   ? Esophageal cancer Neg Hx   ? Inflammatory bowel disease Neg Hx   ? Liver disease Neg Hx   ? Pancreatic cancer Neg Hx   ? ? ?Social History  ? ?Tobacco Use  ? Smoking status: Every Day  ?  Packs/day: 3.00  ?  Years: 32.00  ?  Pack years: 96.00  ?  Types: Cigarettes  ?  Start date: 86  ? Smokeless tobacco: Never  ? Tobacco comments:  ?  currently smoking 1ppd as of 11/11/21 ep  ?Vaping Use  ? Vaping Use: Never used  ?Substance Use Topics  ? Alcohol use: Not Currently  ? Drug use: Never  ? ? ?Current Outpatient Medications  ?Medication Sig Dispense Refill  ? albuterol (VENTOLIN HFA) 108 (90 Base) MCG/ACT inhaler Inhale 2 puffs into the lungs every 6 (six) hours as needed for wheezing or shortness of breath.    ? amLODipine (NORVASC) 5 MG tablet Take 1 tablet (5 mg total) by mouth daily. 30 tablet 3  ? cyclobenzaprine (FLEXERIL) 10 MG tablet Take 10 mg by mouth 2 (two) times daily as needed for muscle spasms.    ? fluvoxaMINE (LUVOX) 100 MG tablet Take 100 mg by mouth 2 (two) times daily.    ? gabapentin (NEURONTIN) 800 MG tablet Take 800 mg by mouth 3 (three) times daily.    ? hyoscyamine (LEVSIN SL) 0.125 MG SL tablet PLACE 1-2 TABLET UNDER THE TONGUE EVERY 4 HOURS AS NEEDED FOR PAIN 60 tablet 0  ? Na Sulfate-K Sulfate-Mg Sulf (SUPREP BOWEL PREP KIT) 17.5-3.13-1.6  GM/177ML SOLN Take 1 kit by mouth as directed. For colonoscopy prep 354 mL 0  ? ondansetron (ZOFRAN ODT) 4 MG disintegrating tablet Take 1 tablet (4 mg total) by mouth every 8 (eight) hours as needed for nausea or vomiting. 30 tablet 2  ? Oxcarbazepine (TRILEPTAL) 300 MG tablet Take 300 mg by mouth in the morning, at noon, and at bedtime.    ? oxyCODONE-acetaminophen (PERCOCET) 10-325 MG tablet Take 1 tablet by mouth 3 (three) times daily as needed for pain.    ?  pantoprazole (PROTONIX) 40 MG tablet Take 1 tablet (40 mg total) by mouth daily. 90 tablet 3  ? prazosin (MINIPRESS) 5 MG capsule Take 5 mg by mouth at bedtime.    ? risperiDONE (RISPERDAL) 0.25 MG tablet Take 0.25 mg by mouth 2 (two) times daily.    ? rizatriptan (MAXALT) 5 MG tablet Take 5 mg by mouth as needed for migraine. May repeat in 2 hours if needed    ? rosuvastatin (CRESTOR) 10 MG tablet Take 10 mg by mouth at bedtime.    ? Semaglutide,0.25 or 0.5MG/DOS, (OZEMPIC, 0.25 OR 0.5 MG/DOSE,) 2 MG/1.5ML SOPN Inject 0.25 mg into the skin every Thursday.    ? sulfaSALAzine (AZULFIDINE) 500 MG tablet Take 500 mg by mouth 2 (two) times daily.    ? Tiotropium Bromide Monohydrate (SPIRIVA RESPIMAT) 2.5 MCG/ACT AERS Inhale 2 puffs into the lungs daily. 4 g 5  ? ?No current facility-administered medications for this visit.  ? ? ?Allergies  ?Allergen Reactions  ? Adalimumab Other (See Comments)  ?  Cervical Cancer ?Humira  ? Baclofen Other (See Comments)  ?  Made pt not feel herself  ? Etanercept Other (See Comments)  ?  Enbrel - pt does not recall reaction   ? Plaquenil [Hydroxychloroquine]   ?  alopecia  ? Topiramate Other (See Comments)  ?  Numbness and tingling ? ?  ? Armodafinil Palpitations  ? ? ?Review of Systems:  ? ?Psychiatric/Behavioral: Has anxiety or depression ? ?  ? ?Physical Exam:   ? ?BP (!) 140/96   Pulse 95   Ht _0  (1.575 m)   Wt 188 lb (85.3 kg)   SpO2 99%   BMI 34.39 kg/m?  ?Filed Weights  ? 02/15/22 1343  ?Weight: 188 lb (85.3 kg)   ? ?Constitutional:  Well-developed, in no acute distress. ?Psychiatric: Normal mood and affect. Behavior is normal. ?HEENT: Pupils normal.  Conjunctivae are normal. No scleral icterus. ?Cardiovascular: Constance Holster

## 2022-02-15 NOTE — H&P (View-Only) (Signed)
? ? ?Chief Complaint: FU ? ?Referring Provider:  Dr Grisso    ? ? ?ASSESSMENT AND PLAN;  ? ?#1. N/V with epi pain likely d/t ozempic. Neg EGD 06/2021, CT AP with contrast 01/2022, H/O cholecystectomy 1999. Not keen on stopping ozempic. No pancreatitis. ? ?#2. H/O diverticulitis 10/2018 ([facility]) and ileus 11/2018 ([facility]) ? ?#3. GERD with small HH. ? ?#4. IBS-D. (With post chole diarrhea) ? ?#5. Fatty liver. Has lost weight as below. Nl LFTs 01/2022 with Alb 3.6, plt 289K. No cirrhosis. ? ?Plan: ?- Solid-phase GES to r/o diabetic gastroparesis. ?- Protonix 40 mg p.o. once a day #30, 11 refills ?- Stool studies for GI Pathogen (includes C. Diff), giardia antigen, fecal elastase and Calprotectin. ?- Continue levsin 0.125mg Q6hrs prn #120. ?- Continue Zofran ODT 4 mg every 8 hours as needed #30, 2 refills. ?- Stop smoking. ?- Proceed with colonoscopy with EMR (Dr. Mansouraty), as scheduled ?- If diarrhea persists, trial of cholestyramine. ? ? ?HPI:   ? ?Lacey Erickson is a 46 y.o. female  ?With ILD (on lung Bx 10/2021), COPD, COVID (June 2022, req O2, not now), DM2, RA on sulfasalazine, fibromyalgia, anxiety/depression ? ?For follow-up visit ? ?Still with N/V/dry heaves but somewhat better. EGD 06/2021 with neg except for gastritis. Neg Bx for HP. Prev SB Bx neg for celiac.  Likely d/t Ozempic which she is not willing to stop as it has helped with DM and wt loss.  Zofran does help with N/V. ? ?Diarrhea 3-4/day. No nocturnal symptoms.  Much better since she has stopped metformin.  No abdominal pain.  They use well water.  Not been tested lately.  Levsin helps. ? ?Underwent colonoscopy 06/2021 showing medium sized lipoma/lesion in the distal ascending colon.  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  She is scheduled for EMR with Dr. Mansouraty on May 1. ? ?Neg CT AP with contrast March 2023 ? ?Has been trying to lose weight on Ozempic. Wt 216lb (jan 2023) to 188 lb today. ? ?No sodas, chocolates, chewing gums, artificial sweeteners and  candy. No NSAIDs ? ?She continues to smoke. ? ?Wt Readings from Last 3 Encounters:  ?02/15/22 188 lb (85.3 kg)  ?01/06/22 201 lb (91.2 kg)  ?11/11/21 216 lb (98 kg)  ? ? ? ? ?Past GI work-up: ? ?Colon 07/01/2021 ?- Medium-sized lipoma/lesion in the distal ascending colon. Bx- SSA.  Scheduled for EMR with Dr. Mansouraty. ?- Moderate right colonic diverticulosis ?- Non-bleeding internal hemorrhoids. ?- The examined portion of the ileum was normal. ?- The examination was otherwise normal on direct ? ?EGD 07/01/2021 ?- Small transient hiatal hernia. ?- Gastritis. Bx- neg for HP ? ?CT Abdo/pelvis with contrast 01/2022 ?-No acute abnormalities ?-Focal fatty liver. ? ? ?-Acute diverticulitis- Adm to [facility] December 2019. After D/C, was readmitted to [facility] with ileus.  She had low potassium and low magnesium.  Managed conservatively by IV fluids, replacing electrolytes, minimizing pain medications and ambulating.  Did not get NG tube. ? ?-EGD 05/2018: small HH, retained food in the stomach without any gastric outlet obstruction.  Neg SB Bx for celiac in past. ? ?-CT AP 10/2018 ?No acute processes in abdomen/pelvis ?Fatty liver ?L1 compression deformity. ? ?Wt Readings from Last 3 Encounters:  ?02/15/22 188 lb (85.3 kg)  ?01/06/22 201 lb (91.2 kg)  ?11/11/21 216 lb (98 kg)  ? ? ? ?Past Medical History:  ?Diagnosis Date  ? Anxiety and depression   ? Bipolar disorder ([facility])   ? Chronic respiratory failure ([facility])   ?   Elevated LFTs   ? Fibromyalgia   ? GERD (gastroesophageal reflux disease)   ? Hepatic steatosis   ? Hyperlipemia   ?    ? RHA (rheumatoid arthritis) ([facility])   ? ? ?Past Surgical History:  ?Procedure Laterality Date  ? APPENDECTOMY    ? BACK SURGERY  07/2018  ? BREAST LUMPECTOMY Left   ? Benign  ? CHOLECYSTECTOMY    ? ESOPHAGOGASTRODUODENOSCOPY  02/25/2016  ? Small hiatal hernia. Retained food in the stomach- no evidence of gastric outlet obstruction.   ? FINGER SURGERY  03/2021  ? right index  ? INTERCOSTAL NERVE BLOCK  Right 10/15/2021  ? Procedure: INTERCOSTAL NERVE BLOCK;  Surgeon: Hendrickson, Steven C, MD;  [facility];  Service: Thoracic;  Laterality: Right;  ? LUNG BIOPSY Right 10/15/2021  ? Procedure: LUNG BIOPSY;  Surgeon: Hendrickson, Steven C, MD;  Location: [facility];  Service: Thoracic;  Laterality: Right;  ? NASAL SEPTUM SURGERY  2021  ? TOTAL ABDOMINAL HYSTERECTOMY W/ BILATERAL SALPINGOOPHORECTOMY    ? Cervical Cancer  ? ? ?Family History  ?Problem Relation Age of Onset  ? Thyroid disease Mother   ? Hypertension Mother   ? Diabetes Mother   ? Other Father   ?     Polio, encephalitis  ? Colon cancer Neg Hx   ? Stomach cancer Neg Hx   ? Rectal cancer Neg Hx   ? Esophageal cancer Neg Hx   ? Inflammatory bowel disease Neg Hx   ? Liver disease Neg Hx   ? Pancreatic cancer Neg Hx   ? ? ?Social History  ? ?Tobacco Use  ? Smoking status: Every Day  ?  Packs/day: 3.00  ?  Years: 32.00  ?  Pack years: 96.00  ?  Types: Cigarettes  ?  Start date: 1988  ? Smokeless tobacco: Never  ? Tobacco comments:  ?  currently smoking 1ppd as of 11/11/21 ep  ?Vaping Use  ? Vaping Use: Never used  ?Substance Use Topics  ? Alcohol use: Not Currently  ? Drug use: Never  ? ? ?Current Outpatient Medications  ?Medication Sig Dispense Refill  ? albuterol (VENTOLIN HFA) 108 (90 Base) MCG/ACT inhaler Inhale 2 puffs into the lungs every 6 (six) hours as needed for wheezing or shortness of breath.    ? amLODipine (NORVASC) 5 MG tablet Take 1 tablet (5 mg total) by mouth daily. 30 tablet 3  ? cyclobenzaprine (FLEXERIL) 10 MG tablet Take 10 mg by mouth 2 (two) times daily as needed for muscle spasms.    ? fluvoxaMINE (LUVOX) 100 MG tablet Take 100 mg by mouth 2 (two) times daily.    ? gabapentin (NEURONTIN) 800 MG tablet Take 800 mg by mouth 3 (three) times daily.    ? hyoscyamine (LEVSIN SL) 0.125 MG SL tablet PLACE 1-2 TABLET UNDER THE TONGUE EVERY 4 HOURS AS NEEDED FOR PAIN 60 tablet 0  ? Na Sulfate-K Sulfate-Mg Sulf (SUPREP BOWEL PREP KIT) 17.5-3.13-1.6  GM/177ML SOLN Take 1 kit by mouth as directed. For colonoscopy prep 354 mL 0  ? ondansetron (ZOFRAN ODT) 4 MG disintegrating tablet Take 1 tablet (4 mg total) by mouth every 8 (eight) hours as needed for nausea or vomiting. 30 tablet 2  ? Oxcarbazepine (TRILEPTAL) 300 MG tablet Take 300 mg by mouth in the morning, at noon, and at bedtime.    ? oxyCODONE-acetaminophen (PERCOCET) 10-325 MG tablet Take 1 tablet by mouth 3 (three) times daily as needed for pain.    ?   pantoprazole (PROTONIX) 40 MG tablet Take 1 tablet (40 mg total) by mouth daily. 90 tablet 3  ? prazosin (MINIPRESS) 5 MG capsule Take 5 mg by mouth at bedtime.    ? risperiDONE (RISPERDAL) 0.25 MG tablet Take 0.25 mg by mouth 2 (two) times daily.    ? rizatriptan (MAXALT) 5 MG tablet Take 5 mg by mouth as needed for migraine. May repeat in 2 hours if needed    ? rosuvastatin (CRESTOR) 10 MG tablet Take 10 mg by mouth at bedtime.    ? Semaglutide,0.25 or 0.5MG/DOS, (OZEMPIC, 0.25 OR 0.5 MG/DOSE,) 2 MG/1.5ML SOPN Inject 0.25 mg into the skin every Thursday.    ? sulfaSALAzine (AZULFIDINE) 500 MG tablet Take 500 mg by mouth 2 (two) times daily.    ? Tiotropium Bromide Monohydrate (SPIRIVA RESPIMAT) 2.5 MCG/ACT AERS Inhale 2 puffs into the lungs daily. 4 g 5  ? ?No current facility-administered medications for this visit.  ? ? ?Allergies  ?Allergen Reactions  ? Adalimumab Other (See Comments)  ?  Cervical Cancer ?Humira  ? Baclofen Other (See Comments)  ?  Made pt not feel herself  ? Etanercept Other (See Comments)  ?  Enbrel - pt does not recall reaction   ? Plaquenil [Hydroxychloroquine]   ?  alopecia  ? Topiramate Other (See Comments)  ?  Numbness and tingling ? ?  ? Armodafinil Palpitations  ? ? ?Review of Systems:  ? ?Psychiatric/Behavioral: Has anxiety or depression ? ?  ? ?Physical Exam:   ? ?BP (!) 140/96   Pulse 95   Ht 5' 2" (1.575 m)   Wt 188 lb (85.3 kg)   SpO2 99%   BMI 34.39 kg/m?  ?Filed Weights  ? 02/15/22 1343  ?Weight: 188 lb (85.3 kg)   ? ?Constitutional:  Well-developed, in no acute distress. ?Psychiatric: Normal mood and affect. Behavior is normal. ?HEENT: Pupils normal.  Conjunctivae are normal. No scleral icterus. ?Cardiovascular: Norma

## 2022-02-15 NOTE — Patient Instructions (Addendum)
If you are age 47 or older, your body mass index should be between 23-30. Your Body mass index is 34.39 kg/m?Marland Kitchen If this is out of the aforementioned range listed, please consider follow up with your Primary Care Provider. ? ?If you are age 51 or younger, your body mass index should be between 19-25. Your Body mass index is 34.39 kg/m?Marland Kitchen If this is out of the aformentioned range listed, please consider follow up with your Primary Care Provider.  ? ?________________________________________________________ ? ?The [facility] providers would like to encourage you to use [facility] to communicate with providers for non-urgent requests or questions.  Due to long hold times on the telephone, sending your provider a message by [facility] may be a faster and more efficient way to get a response.  Please allow 48 business hours for a response.  Please remember that this is for non-urgent requests.  ?_______________________________________________________ ? ?Please go to the lab on the 2nd floor [address] before you leave the office today.  ? ?We have sent the following medications to your pharmacy for you to pick up at your convenience: ?Protonix. Levsin, Zofran ? ?Please stop smoking ? ?Keep upcoming appointments. ? ?Please call with any questions or concerns. ? ? ?You have been scheduled for a gastric emptying scan at [facility] on 02-23-2022  at   730am   . Please arrive at your time for registration. Please make certain not to have anything to eat or drink after midnight the night before your test. Hold all stomach medications (ex: Zofran, phenergan, Reglan) 8 hours prior to your test. If you need to reschedule your appointment, please contact radiology scheduling at [phone]. ?_____________________________________________________________________ ?A gastric-emptying study measures how long it takes for food to move through your stomach. There are several ways to measure stomach emptying. In the most common test, you  eat food that contains a small amount of radioactive material. A scanner that detects the movement of the radioactive material is placed over your abdomen to monitor the rate at which food leaves your stomach. This test normally takes about 4 hours to complete. ?_____________________________________________________________________ ? ? ?Thank you, ? ?Dr. Jackquline Denmark ? ? ? ? ? ? ?We want to thank you for trusting [facility] with your care. All of our staff and providers value the relationships we have built with our patients, and it is an honor to care for you.  ? ?We are writing to let you know that [facility] will close on Mar 21, 2022, and we invite you to continue to see Dr. Carmell Austria and Gerrit Heck at the [facility] [place] office location. We are consolidating our serices at these [facility] practices to better provide care. Our office staff will work with you to ensure a seamless transition.  ? ?Gerrit Heck, DO -Dr. Bryan Lemma will be movig to [facility] at [address], [place], [place] [address], effective Mar 21, 2022.  Contact [phone] to schedule an appointment with him.  ? ?Carmell Austria, MD- Dr. Lyndel Safe will be movig to [facility] at [address], [place],  [address], effective Mar 21, 2022.  Contact [phone] to schedule an appointment with him.  ? ?Requesting Medical Records ?If you need to request your medical records, please follow the instructions below. Your medical records are confidential, and a copy can be transferred to another provider or released to you or another person you designate only with your permission. ? ?There are  several ways to request your medical records: ?Requests for medical records can be submitted through our practice.   ?You can also request your records electronically, in your MyChart account by selecting the ?Request Health Records? tab.  ?If you need additional  information on how to request records, please go to [email or web address] choose Patient Information, then select Request Medical Records. ?To make an appointment or if you have any questions about your health care needs, please contact our office at [phone] and one of our staff members will be glad to assist you. ?[facility] is committed to providing exceptional care for you and our community. Thank you for allowing [place] to serve your health care needs. ?Sincerely, ? ?Windy Canny, Director [facility] ?[facility] also offers convenient virtual care options. Sore throat? Sinus problems? Cold or flu symptoms? Get care from the comfort of home with [facility] Video Visits and e-Visits. Learn more about the non-emergency conditions treated and start your virtual visit at [email or web address] ? ? ?

## 2022-02-23 ENCOUNTER — Encounter (HOSPITAL_COMMUNITY): Admission: RE | Admit: 2022-02-23 | Payer: Medicare Other | Source: Ambulatory Visit

## 2022-02-26 ENCOUNTER — Encounter: Payer: Self-pay | Admitting: Gastroenterology

## 2022-02-28 ENCOUNTER — Encounter (HOSPITAL_COMMUNITY): Payer: Self-pay | Admitting: Gastroenterology

## 2022-03-07 ENCOUNTER — Other Ambulatory Visit: Payer: Self-pay

## 2022-03-07 ENCOUNTER — Encounter (HOSPITAL_COMMUNITY): Payer: Self-pay | Admitting: Gastroenterology

## 2022-03-07 ENCOUNTER — Ambulatory Visit (HOSPITAL_COMMUNITY)
Admission: RE | Admit: 2022-03-07 | Discharge: 2022-03-07 | Disposition: A | Discharge status: Home / Self Care | Payer: Medicare Other | Attending: Gastroenterology | Admitting: Gastroenterology

## 2022-03-07 ENCOUNTER — Ambulatory Visit (HOSPITAL_COMMUNITY): Payer: Medicare Other | Admitting: Certified Registered"

## 2022-03-07 ENCOUNTER — Encounter (HOSPITAL_COMMUNITY)
Admission: RE | Disposition: A | Discharge status: Home / Self Care | Payer: Self-pay | Source: Home / Self Care | Attending: Gastroenterology

## 2022-03-07 ENCOUNTER — Ambulatory Visit (HOSPITAL_BASED_OUTPATIENT_CLINIC_OR_DEPARTMENT_OTHER): Payer: Medicare Other | Admitting: Certified Registered"

## 2022-03-07 DIAGNOSIS — K642 Third degree hemorrhoids: Secondary | ICD-10-CM | POA: Diagnosis not present

## 2022-03-07 DIAGNOSIS — K633 Ulcer of intestine: Secondary | ICD-10-CM

## 2022-03-07 DIAGNOSIS — K635 Polyp of colon: Secondary | ICD-10-CM

## 2022-03-07 DIAGNOSIS — K6389 Other specified diseases of intestine: Secondary | ICD-10-CM | POA: Diagnosis not present

## 2022-03-07 DIAGNOSIS — Z79899 Other long term (current) drug therapy: Secondary | ICD-10-CM | POA: Diagnosis not present

## 2022-03-07 DIAGNOSIS — F17200 Nicotine dependence, unspecified, uncomplicated: Secondary | ICD-10-CM | POA: Diagnosis not present

## 2022-03-07 DIAGNOSIS — F419 Anxiety disorder, unspecified: Secondary | ICD-10-CM | POA: Insufficient documentation

## 2022-03-07 DIAGNOSIS — D175 Benign lipomatous neoplasm of intra-abdominal organs: Secondary | ICD-10-CM | POA: Insufficient documentation

## 2022-03-07 DIAGNOSIS — J449 Chronic obstructive pulmonary disease, unspecified: Secondary | ICD-10-CM | POA: Insufficient documentation

## 2022-03-07 DIAGNOSIS — D490 Neoplasm of unspecified behavior of digestive system: Secondary | ICD-10-CM

## 2022-03-07 DIAGNOSIS — E119 Type 2 diabetes mellitus without complications: Secondary | ICD-10-CM | POA: Insufficient documentation

## 2022-03-07 DIAGNOSIS — R109 Unspecified abdominal pain: Secondary | ICD-10-CM

## 2022-03-07 DIAGNOSIS — K644 Residual hemorrhoidal skin tags: Secondary | ICD-10-CM

## 2022-03-07 DIAGNOSIS — R112 Nausea with vomiting, unspecified: Secondary | ICD-10-CM

## 2022-03-07 DIAGNOSIS — F319 Bipolar disorder, unspecified: Secondary | ICD-10-CM | POA: Diagnosis not present

## 2022-03-07 DIAGNOSIS — Q438 Other specified congenital malformations of intestine: Secondary | ICD-10-CM | POA: Insufficient documentation

## 2022-03-07 DIAGNOSIS — I10 Essential (primary) hypertension: Secondary | ICD-10-CM | POA: Diagnosis not present

## 2022-03-07 DIAGNOSIS — K219 Gastro-esophageal reflux disease without esophagitis: Secondary | ICD-10-CM | POA: Insufficient documentation

## 2022-03-07 DIAGNOSIS — M797 Fibromyalgia: Secondary | ICD-10-CM | POA: Diagnosis not present

## 2022-03-07 DIAGNOSIS — R933 Abnormal findings on diagnostic imaging of other parts of digestive tract: Secondary | ICD-10-CM

## 2022-03-07 HISTORY — PX: EUS: SHX5427

## 2022-03-07 HISTORY — PX: HEMOSTASIS CLIP PLACEMENT: SHX6857

## 2022-03-07 HISTORY — PX: SUBMUCOSAL LIFTING INJECTION: SHX6855

## 2022-03-07 HISTORY — PX: ENDOSCOPIC MUCOSAL RESECTION: SHX6839

## 2022-03-07 HISTORY — PX: COLONOSCOPY WITH PROPOFOL: SHX5780

## 2022-03-07 LAB — GLUCOSE, CAPILLARY: Glucose-Capillary: 92 mg/dL (ref 70–99)

## 2022-03-07 SURGERY — COLONOSCOPY WITH PROPOFOL
Anesthesia: Monitor Anesthesia Care

## 2022-03-07 MED ORDER — PROPOFOL 1000 MG/100ML IV EMUL
INTRAVENOUS | Status: AC
  Filled 2022-03-07: qty 100

## 2022-03-07 MED ORDER — SODIUM CHLORIDE 0.9 % IV SOLN: INTRAVENOUS | Status: DC

## 2022-03-07 MED ORDER — LIDOCAINE 2% (20 MG/ML) 5 ML SYRINGE
INTRAMUSCULAR | Status: DC | PRN
  Administered 2022-03-07: 80 mg via INTRAVENOUS

## 2022-03-07 MED ORDER — LACTATED RINGERS IV SOLN
INTRAVENOUS | Status: DC
Start: 2022-03-07 — End: 2022-03-07

## 2022-03-07 MED ORDER — PROPOFOL 500 MG/50ML IV EMUL
INTRAVENOUS | Status: DC | PRN
  Administered 2022-03-07: 125 ug/kg/min via INTRAVENOUS

## 2022-03-07 MED ORDER — PROPOFOL 10 MG/ML IV BOLUS
INTRAVENOUS | Status: DC | PRN
  Administered 2022-03-07 (×5): 20 mg via INTRAVENOUS

## 2022-03-07 MED ORDER — HYDROCORTISONE ACETATE 25 MG RE SUPP: 25.0000 mg | Freq: Every day | RECTAL | 1 refills | Status: AC

## 2022-03-07 SURGICAL SUPPLY — 22 items

## 2022-03-07 NOTE — Interval H&P Note (Signed)
? ?GASTROENTEROLOGY PROCEDURE H&P NOTE  ? ?Primary Care Physician: ?Edwinna Areola, FNP ? ?HPI: ?Lacey Erickson is a 46 y.o. female who presents for Colonoscopy with EUS/EMR of a polypoid lesion on top of a SEL with attempt at better defining SEL. ? ?Past Medical History:  ?Diagnosis Date  ? Anxiety and depression   ? Bipolar disorder ([place])   ? Cervical cancer ([place])   ? Chronic respiratory failure ([place])   ? COPD (chronic obstructive pulmonary disease) ([place])   ? Diabetes mellitus without complication ([place])   ? Diverticulitis   ? Dyspnea   ? Elevated LFTs   ? Fibromyalgia   ? GERD (gastroesophageal reflux disease)   ? Hepatic steatosis   ? History of kidney stones   ? Hyperlipemia   ? Hypertension   ? Ileus ([place])   ? Osteopenia   ? Oxygen dependent   ? RHA (rheumatoid arthritis) ([place])   ? ?Past Surgical History:  ?Procedure Laterality Date  ? APPENDECTOMY    ? BACK SURGERY  07/2018  ? BREAST LUMPECTOMY Left   ? Benign  ? CHOLECYSTECTOMY    ? ESOPHAGOGASTRODUODENOSCOPY  02/25/2016  ? Small hiatal hernia. Retained food in the stomach- no evidence of gastric outlet obstruction.   ? FINGER SURGERY  03/2021  ? right index  ? INTERCOSTAL NERVE BLOCK Right 10/15/2021  ? Procedure: INTERCOSTAL NERVE BLOCK;  Surgeon: Melrose Nakayama, MD;  Location: [facility];  Service: Thoracic;  Laterality: Right;  ? LUNG BIOPSY Right 10/15/2021  ? Procedure: LUNG BIOPSY;  Surgeon: Melrose Nakayama, MD;  Location: [facility];  Service: Thoracic;  Laterality: Right;  ? NASAL SEPTUM SURGERY  2021  ? TOTAL ABDOMINAL HYSTERECTOMY W/ BILATERAL SALPINGOOPHORECTOMY    ? Cervical Cancer  ? ?Current Facility-Administered Medications  ?Medication Dose Route Frequency Provider Last Rate Last Admin  ? 0.9 %  sodium chloride infusion   Intravenous Continuous Mansouraty, Telford Nab., MD      ? lactated ringers infusion   Intravenous Continuous Mansouraty, Telford Nab., MD 10 mL/hr at 03/07/22 0736 New Bag at 03/07/22 0736  ? ? ?Current  Facility-Administered Medications:  ?  0.9 %  sodium chloride infusion, , Intravenous, Continuous, Mansouraty, Telford Nab., MD ?  lactated ringers infusion, , Intravenous, Continuous, Mansouraty, Telford Nab., MD, Last Rate: 10 mL/hr at 03/07/22 0736, New Bag at 03/07/22 0736 ?Allergies  ?Allergen Reactions  ? Adalimumab Other (See Comments)  ?  Cervical Cancer ?Humira  ? Baclofen Other (See Comments)  ?  Made pt not feel herself  ? Etanercept Other (See Comments)  ?  Enbrel - pt does not recall reaction   ? Plaquenil [Hydroxychloroquine]   ?  alopecia  ? Topiramate Other (See Comments)  ?  Numbness and tingling ? ?  ? Armodafinil Palpitations  ? ?Family History  ?Problem Relation Age of Onset  ? Thyroid disease Mother   ? Hypertension Mother   ? Diabetes Mother   ? Other Father   ?     Polio, encephalitis  ? Colon cancer Neg Hx   ? Stomach cancer Neg Hx   ? Rectal cancer Neg Hx   ? Esophageal cancer Neg Hx   ? Inflammatory bowel disease Neg Hx   ? Liver disease Neg Hx   ? Pancreatic cancer Neg Hx   ? ?Social History  ? ?Socioeconomic History  ? Marital status: Married  ?  Spouse name: Not on file  ? Number of children: 3  ? Years of education:  Not on file  ? Highest education level: Not on file  ?Occupational History  ? Not on file  ?Tobacco Use  ? Smoking status: Every Day  ?  Packs/day: 3.00  ?  Years: 32.00  ?  Pack years: 96.00  ?  Types: Cigarettes  ?  Start date: 39  ? Smokeless tobacco: Never  ? Tobacco comments:  ?  currently smoking 1ppd as of 11/11/21 ep  ?Vaping Use  ? Vaping Use: Never used  ?Substance and Sexual Activity  ? Alcohol use: Not Currently  ? Drug use: Never  ? Sexual activity: Not on file  ?Other Topics Concern  ? Not on file  ?Social History Narrative  ? Not on file  ? ?Social Determinants of Health  ? ?Financial Resource Strain: Not on file  ?Food Insecurity: Not on file  ?Transportation Needs: Not on file  ?Physical Activity: Not on file  ?Stress: Not on file  ?Social Connections: Not on  file  ?Intimate Partner Violence: Not on file  ? ? ?Physical Exam: ?Today's Vitals  ? 03/07/22 0726  ?BP: 122/76  ?Pulse: 89  ?Resp: 17  ?Temp: (!) 97.5 ?F (36.4 ?C)  ?TempSrc: Temporal  ?SpO2: 92%  ?Weight: 89.8 kg  ?Height: '5\' 2"'$  (1.575 m)  ?PainSc: 0-No pain  ? ?Body mass index is 36.21 kg/m?. ?GEN: NAD ?EYE: Sclerae anicteric ?ENT: MMM ?CV: Non-tachycardic ?GI: Soft, NT/ND ?NEURO:  Alert & Oriented x 3 ? ?Lab Results: ?No results for input(s): WBC, HGB, HCT, PLT in the last 72 hours. ?BMET ?No results for input(s): NA, K, CL, CO2, GLUCOSE, BUN, CREATININE, CALCIUM in the last 72 hours. ?LFT ?No results for input(s): PROT, ALBUMIN, AST, ALT, ALKPHOS, BILITOT, BILIDIR, IBILI in the last 72 hours. ?PT/INR ?No results for input(s): LABPROT, INR in the last 72 hours. ? ? ?Impression / Plan: ?This is a 46 y.o.female who presents for Colonoscopy with EUS/EMR of a polypoid lesion on top of a SEL with attempt at better defining SEL. ? ?The risks of an EUS including intestinal perforation, bleeding, infection, aspiration, and medication effects were discussed as was the possibility it may not give a definitive diagnosis if a biopsy is performed. ? ?The risks and benefits of endoscopic evaluation/treatment were discussed with the patient and/or family; these include but are not limited to the risk of perforation, infection, bleeding, missed lesions, lack of diagnosis, severe illness requiring hospitalization, as well as anesthesia and sedation related illnesses.  The patient's history has been reviewed, patient examined, no change in status, and deemed stable for procedure.  The patient and/or family is agreeable to proceed.  ? ? ?Justice Britain, MD ?[facility] ?Advanced Endoscopy ?Office # [phone] ? ?

## 2022-03-07 NOTE — Anesthesia Postprocedure Evaluation (Signed)
Anesthesia Post Note ? ?Patient: Lacey Erickson ? ?Procedure(s) Performed: COLONOSCOPY WITH PROPOFOL ?ENDOSCOPIC MUCOSAL RESECTION ?LOWER ENDOSCOPIC ULTRASOUND (EUS) ?SUBMUCOSAL LIFTING INJECTION ?HEMOSTASIS CLIP PLACEMENT ? ?  ? ?Patient location during evaluation: PACU ?Anesthesia Type: MAC ?Level of consciousness: awake and alert ?Pain management: pain level controlled ?Vital Signs Assessment: post-procedure vital signs reviewed and stable ?Respiratory status: spontaneous breathing, nonlabored ventilation and respiratory function stable ?Cardiovascular status: blood pressure returned to baseline and stable ?Postop Assessment: no apparent nausea or vomiting ?Anesthetic complications: no ? ? ?No notable events documented. ? ?Last Vitals:  ?Vitals:  ? 03/07/22 0930 03/07/22 0940  ?BP: (!) 153/85 126/76  ?Pulse: 76 69  ?Resp: 14 11  ?Temp:    ?SpO2: 93% 92%  ?  ?Last Pain:  ?Vitals:  ? 03/07/22 0940  ?TempSrc:   ?PainSc: 0-No pain  ? ? ?  ?  ?  ?  ?  ?  ? ?Lynda Rainwater ? ? ? ? ?

## 2022-03-07 NOTE — Op Note (Addendum)
[facility] ?Patient Name: Lacey Erickson ?Procedure Date: 03/07/2022 ?MRN: [id number] ?Attending MD: Justice Britain , MD ?Date of Birth: May 21, 1976 ?CSN: [id number] ?Age: 46 ?Admit Type: Outpatient ?Procedure:                Lower EUS ?Indications:              Ascending colon deformity found on endoscopy;  ?                          subepithelial tumor vs extrinsic compression vs  ?                          lipoma, For therapy of colon polyps (SSP noted on  ?                          top of SEL on last colonoscopy) ?Providers:                Justice Britain, MD, Burtis Junes, RN, Cindee Salt  ?                          Teaching laboratory technician ?Referring MD:             Jackquline Denmark, MD, FNP [place] ?Medicines:                Monitored Anesthesia Care ?Complications:            No immediate complications. ?Estimated Blood Loss:     Estimated blood loss was minimal. ?Procedure:                Pre-Anesthesia Assessment: ?                          - Prior to the procedure, a History and Physical  ?                          was performed, and patient medications and  ?                          allergies were reviewed. The patient's tolerance of  ?                          previous anesthesia was also reviewed. The risks  ?                          and benefits of the procedure and the sedation  ?                          options and risks were discussed with the patient.  ?                          All questions were answered, and informed consent  ?                          was obtained. Prior Anticoagulants: The patient has  ?  taken no previous anticoagulant or antiplatelet  ?                          agents. ASA Grade Assessment: III - A patient with  ?                          severe systemic disease. After reviewing the risks  ?                          and benefits, the patient was deemed in  ?                          satisfactory condition to undergo the procedure. ?                           After obtaining informed consent, the endoscope was  ?                          passed under direct vision. Throughout the  ?                          procedure, the patient's blood pressure, pulse, and  ?                          oxygen saturations were monitored continuously. The  ?                          [id number]-[id number] ([id number]) Olympus colonoscope was  ?                          introduced through the anus and advanced to the the  ?                          terminal ileum for ultrasound. After obtaining  ?                          informed consent, the endoscope was passed under  ?                          direct vision. Throughout the procedure, the  ?                          patient's blood pressure, pulse, and oxygen  ?                          saturations were monitored continuously. The  ?                          [id number] ([id number]) Olympus was introduced through  ?                          the anus and advanced to the the ascending colon  ?  for ultrasound. The lower EUS was technically  ?                          difficult and complex due to a tortuous colon.  ?                          Successful completion of the procedure was aided by  ?                          changing the patient's position, using manual  ?                          pressure, straightening and shortening the scope to  ?                          obtain bowel loop reduction, using scope torsion  ?                          and performing the maneuvers documented (below) in  ?                          this report. The quality of the bowel preparation  ?                          was adequate. ?Scope In: 8:08:42 AM ?Scope Out: 9:09:39 AM ?Scope Withdrawal Time: 0 hours 53 minutes 34 seconds  ?Total Procedure Duration: 1 hour 0 minutes 57 seconds  ?Findings: ?     Skin tags were found on perianal exam. ?     The digital rectal exam findings include hemorrhoids. Pertinent  ?     negatives include no  palpable rectal lesions. ?     ENDOSCOPIC FINDING: : ?     The colon (entire examined portion) was significantly tortuous. ?     A submucosal non-obstructing medium-sized lesion was found in the  ?     ascending colon. The lesion was partially circumferential (involving  ?     one-third of the lumen circumference). No bleeding was present. ?     On top of the submucosal lesion, was an approximately 25 mm polypoid  ?     appearing tissue from the ascending colon. It is not clearly a typical  ?     SSP (biopsies previously had suggested this however to be the case). The  ?     polyp was flat. Due to the previous biopsies, preparations were made for  ?     mucosal resection after the EUS had been completed. NBI imaging and  ?     White-light endoscopy was done to demarcate the borders of the lesion.  ?     Everlift was injected to raise the lesion, but as the lesion (noted on  ?     EUS is submucosal) lift was not appropriate unfortunately. Using an  ?     underwater technique, piecemeal mucosal resection using a cold snare was  ?     performed. Resection and retrieval were complete. To prevent bleeding  ?     post-intervention, two hemostatic clips were successfully placed (MR  ?     conditional), however,  complete closure of the defect was not possible,  ?     due to the submucosal nature and impression within the colon of the  ?     lesion noted on EUS. There was no bleeding at the end of the procedure. ?     A diffuse area of granular mucosa was found in the recto-sigmoid colon  ?     and in the sigmoid colon. Biopsies were taken with a cold forceps for  ?     histology to rule out chronic colitis. ?     Normal mucosa was found in the entire colon otherwise. ?     Non-bleeding non-thrombosed external and internal hemorrhoids were found  ?     during retroflexion, during perianal exam and during digital exam. The  ?     hemorrhoids were Grade III (internal hemorrhoids that prolapse but  ?     require manual  reduction). ?     ENDOSONOGRAPHIC FINDING: : ?     A round intramural (subepithelial) lesion was found in the ascending  ?     colon. The lesion was hyperechoic. Sonographically, the origin appeared  ?     to be within the deep mucosa (Layer 2) and submucosa (Layer 3). The  ?     lesion measured up to 20 mm in thickness. The endosonographic borders  ?     were smooth. ?Impression:               COLON: ?                          - Perianal skin tags found on perianal exam.  ?                          Hemorrhoids found on digital rectal exam. ?                          - Tortuous colon. ?                          - Subepithelial lesion in the ascending colon -  ?                          evaluated by EUS as noted below. ?                          - One 25 mm polypoid appearing lesion, on top of  ?                          the SEL was noted on the ascending colon, removed  ?                          with piecemeal mucosal resection. Resected and  ?                          retrieved. Clips (MR conditional) were placed  ?                          (though complete closure not possible due to the  ?  nature of the SEL). ?                          - Granularity in the recto-sigmoid colon and in the  ?                          sigmoid colon. Biopsied. ?                          - Normal mucosa in the entire examined colon  ?                          otherwise. ?                          - Non-bleeding non-thrombosed external and internal  ?                          hemorrhoids. ?                          EUS Impression: ?                          - An intramural (subepithelial) lesion was  ?                          visualized endosonographically in the ascending  ?                          colon. Sonographically, the origin appeared to be  ?                          within the deep mucosa (Layer 2) and submucosa  ?                          (Layer 3). Tissue has not been obtained. However,  ?                           the endosonographic appearance is consistent with a  ?                          lipoma. The MP is free of this lesion, so not a  ?                          GIST/Leiomyoma based on typical EUS fi

## 2022-03-07 NOTE — Discharge Instructions (Signed)

## 2022-03-07 NOTE — Anesthesia Preprocedure Evaluation (Signed)
Anesthesia Evaluation  ?Patient identified by MRN, date of birth, ID band ?Patient awake ? ? ? ?Reviewed: ?Allergy & Precautions, H&P , NPO status , Patient's Chart, lab work & pertinent test results ? ?Airway ?Mallampati: II ? ?TM Distance: >3 FB ?Neck ROM: full ? ? ? Dental ?no notable dental hx. ? ?  ?Pulmonary ?shortness of breath, COPD, Current Smoker and Patient abstained from smoking.,  ?  ?Pulmonary exam normal ?breath sounds clear to auscultation ? ? ? ? ? ? Cardiovascular ?hypertension, Pt. on medications ?Normal cardiovascular exam ?Rhythm:regular Rate:Normal ? ? ?  ?Neuro/Psych ?PSYCHIATRIC DISORDERS Anxiety Depression Bipolar Disorder  Neuromuscular disease   ? GI/Hepatic ?GERD  ,  ?Endo/Other  ?diabetes, Type 2 ? Renal/GU ?  ? ?  ?Musculoskeletal ? ?(+) Arthritis , Rheumatoid disorders,  Fibromyalgia - ? Abdominal ?(+) + obese,   ?Peds ? Hematology ?  ?Anesthesia Other Findings ? ? Reproductive/Obstetrics ? ?  ? ? ? ? ? ? ? ? ? ? ? ? ? ?  ?  ? ? ? ? ? ? ? ? ?Anesthesia Physical ? ?Anesthesia Plan ? ?ASA: 3 ? ?Anesthesia Plan: MAC  ? ?Post-op Pain Management: Minimal or no pain anticipated  ? ?Induction: Intravenous ? ?PONV Risk Score and Plan: 2 and Ondansetron, Midazolam, Treatment may vary due to age or medical condition and Propofol infusion ? ?Airway Management Planned: Simple Face Mask ? ?Additional Equipment:  ? ?Intra-op Plan:  ? ?Post-operative Plan:  ? ?Informed Consent: I have reviewed the patients History and Physical, chart, labs and discussed the procedure including the risks, benefits and alternatives for the proposed anesthesia with the patient or authorized representative who has indicated his/her understanding and acceptance.  ? ? ? ?Dental advisory given ? ?Plan Discussed with: CRNA, Anesthesiologist and Surgeon ? ?Anesthesia Plan Comments:   ? ? ? ? ? ? ?Anesthesia Quick Evaluation ? ?

## 2022-03-07 NOTE — Interval H&P Note (Signed)
History and Physical Interval Note: ? ?03/07/2022 ?7:56 AM ? ?Isa Hitz  has presented today for surgery, with the diagnosis of Hx of colon polyps, Abnormal Colonoscopy.  The various methods of treatment have been discussed with the patient and family. After consideration of risks, benefits and other options for treatment, the patient has consented to  Procedure(s): ?COLONOSCOPY WITH PROPOFOL (N/A) ?ENDOSCOPIC MUCOSAL RESECTION (N/A) ?UPPER ESOPHAGEAL ENDOSCOPIC ULTRASOUND (EUS) (N/A) as a surgical intervention.  The patient's history has been reviewed, patient examined, no change in status, stable for surgery.  I have reviewed the patient's chart and labs.  Questions were answered to the patient's satisfaction.   ? ?The risks of an EUS including intestinal perforation, bleeding, infection, aspiration, and medication effects were discussed as was the possibility it may not give a definitive diagnosis if a biopsy is performed.   ? ? ?Irving Copas ? ? ?

## 2022-03-07 NOTE — Transfer of Care (Signed)
Immediate Anesthesia Transfer of Care Note ? ?Patient: Lacey Erickson ? ?Procedure(s) Performed: COLONOSCOPY WITH PROPOFOL ?ENDOSCOPIC MUCOSAL RESECTION ?LOWER ENDOSCOPIC ULTRASOUND (EUS) ?SUBMUCOSAL LIFTING INJECTION ?HEMOSTASIS CLIP PLACEMENT ? ?Patient Location: PACU ? ?Anesthesia Type:MAC ? ?Level of Consciousness: awake, alert  and oriented ? ?Airway & Oxygen Therapy: Patient Spontanous Breathing and Patient connected to nasal cannula oxygen ? ?Post-op Assessment: Report given to RN and Post -op Vital signs reviewed and stable ? ?Post vital signs: Reviewed and stable ? ?Last Vitals:  ?Vitals Value Taken Time  ?BP 151/77 03/07/22 0920  ?Temp    ?Pulse 81 03/07/22 0920  ?Resp 15 03/07/22 0920  ?SpO2 100 % 03/07/22 0920  ?Vitals shown include unvalidated device data. ? ?Last Pain:  ?Vitals:  ? 03/07/22 0726  ?TempSrc: Temporal  ?PainSc: 0-No pain  ?   ? ?  ? ?Complications: No notable events documented. ?

## 2022-03-08 ENCOUNTER — Encounter (HOSPITAL_COMMUNITY): Payer: Self-pay | Admitting: Gastroenterology

## 2022-03-09 LAB — SURGICAL PATHOLOGY

## 2022-03-10 ENCOUNTER — Encounter: Payer: Self-pay | Admitting: Gastroenterology

## 2022-03-10 ENCOUNTER — Other Ambulatory Visit: Payer: Self-pay | Admitting: Gastroenterology

## 2022-03-15 ENCOUNTER — Ambulatory Visit (HOSPITAL_COMMUNITY): Payer: Medicare Other | Attending: Gastroenterology

## 2022-03-15 ENCOUNTER — Encounter (HOSPITAL_COMMUNITY): Payer: Self-pay

## 2022-04-04 ENCOUNTER — Other Ambulatory Visit: Payer: Self-pay | Admitting: Gastroenterology

## 2022-05-05 ENCOUNTER — Other Ambulatory Visit: Payer: Self-pay | Admitting: Gastroenterology

## 2022-05-09 ENCOUNTER — Other Ambulatory Visit: Payer: Self-pay | Admitting: Internal Medicine

## 2022-05-09 ENCOUNTER — Other Ambulatory Visit: Payer: Self-pay | Admitting: Physician Assistant

## 2022-05-12 ENCOUNTER — Telehealth (HOSPITAL_BASED_OUTPATIENT_CLINIC_OR_DEPARTMENT_OTHER): Payer: Self-pay

## 2022-05-14 ENCOUNTER — Other Ambulatory Visit: Payer: Self-pay | Admitting: Gastroenterology

## 2022-05-17 ENCOUNTER — Other Ambulatory Visit: Payer: Self-pay | Admitting: Physician Assistant

## 2022-05-17 ENCOUNTER — Other Ambulatory Visit: Payer: Self-pay | Admitting: Gastroenterology

## 2022-05-23 ENCOUNTER — Other Ambulatory Visit: Payer: Self-pay | Admitting: Physician Assistant

## 2022-06-13 ENCOUNTER — Other Ambulatory Visit: Payer: Self-pay | Admitting: Gastroenterology

## 2022-06-16 ENCOUNTER — Other Ambulatory Visit: Payer: Self-pay | Admitting: Gastroenterology

## 2022-07-18 ENCOUNTER — Other Ambulatory Visit: Payer: Self-pay | Admitting: Gastroenterology

## 2022-11-14 ENCOUNTER — Encounter: Payer: Self-pay | Admitting: Gastroenterology

## 2022-11-19 ENCOUNTER — Other Ambulatory Visit: Payer: Self-pay | Admitting: Gastroenterology

## 2022-12-31 ENCOUNTER — Other Ambulatory Visit: Payer: Self-pay | Admitting: Gastroenterology

## 2023-03-07 ENCOUNTER — Other Ambulatory Visit: Payer: Self-pay | Admitting: Gastroenterology

## 2023-04-16 IMAGING — CT CT ABD-PELV W/ CM
2 of 5 series · 16 of 46 positions shown, 18 images · IV contrast (Omnipaque)
Comparison: 10/25/2018 from [facility]

CLINICAL DATA: Abdominal pain and cramping. Nausea and vomiting.
Abnormal colonoscopy.

EXAM:
CT ABDOMEN AND PELVIS WITH CONTRAST
TECHNIQUE: Multidetector CT imaging of the abdomen and pelvis was performed
using the standard protocol following bolus administration of
intravenous contrast.

[Series 2: axial st · axial · 0.98mm/px · z∈[-499,-89]mm · 13 of 94 slices shown, 15 images]
[im 6/94  soft-tissue]
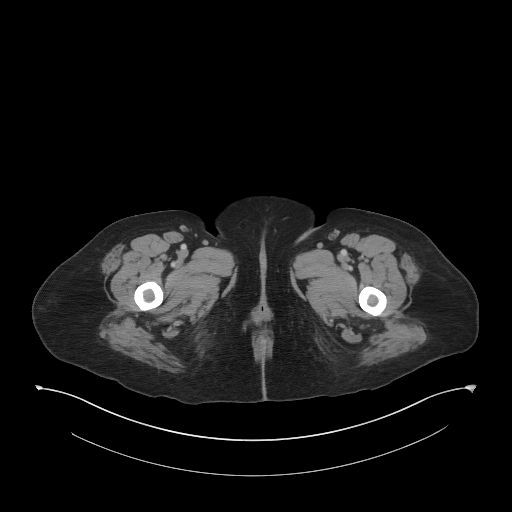
[im 6/94  bone]
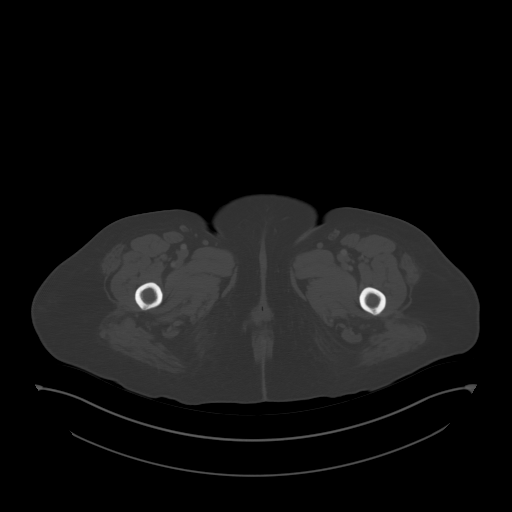
[im 11/94  soft-tissue]
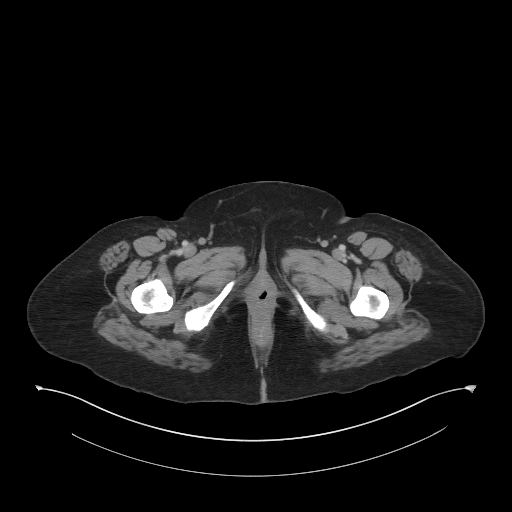
[im 21/94  soft-tissue]
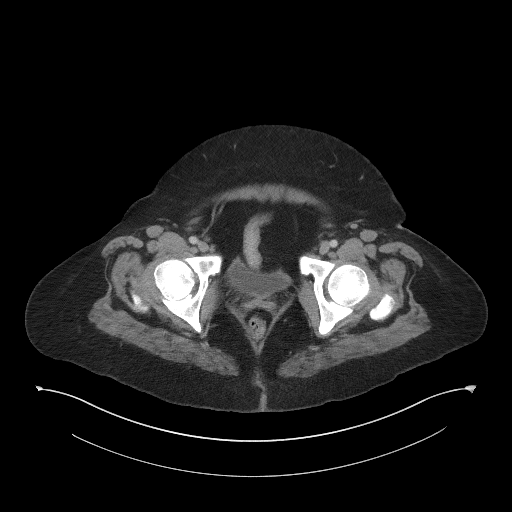
[im 26/94  soft-tissue]
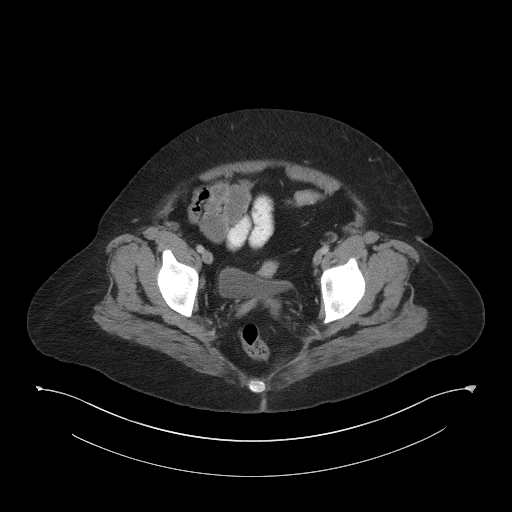
[im 32/94  soft-tissue]
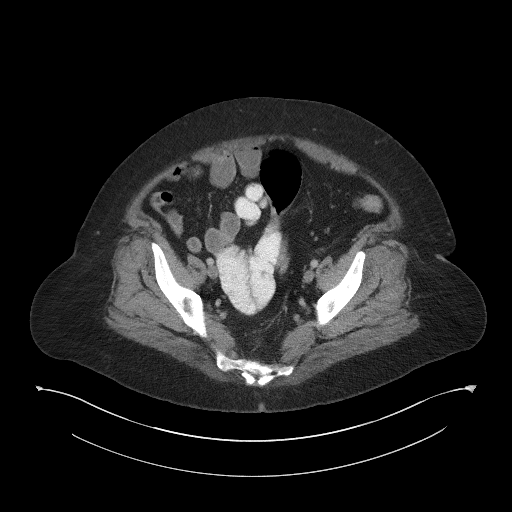
[im 42/94  soft-tissue]
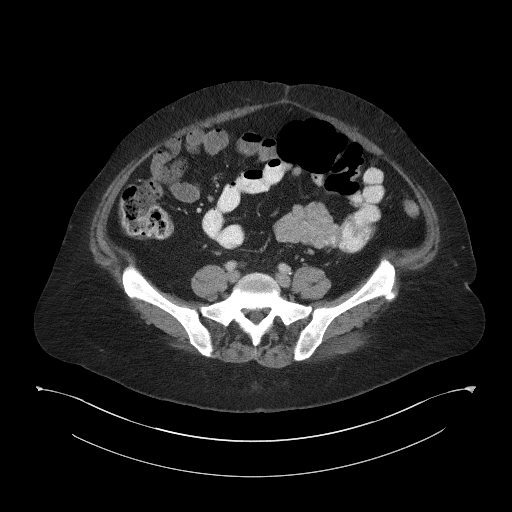
[im 47/94  soft-tissue]
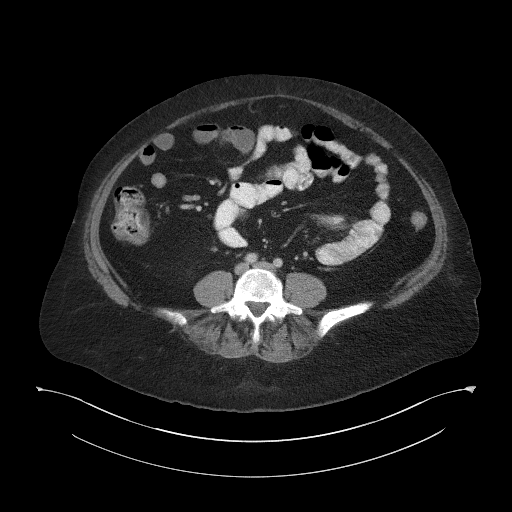
[im 52/94  soft-tissue]
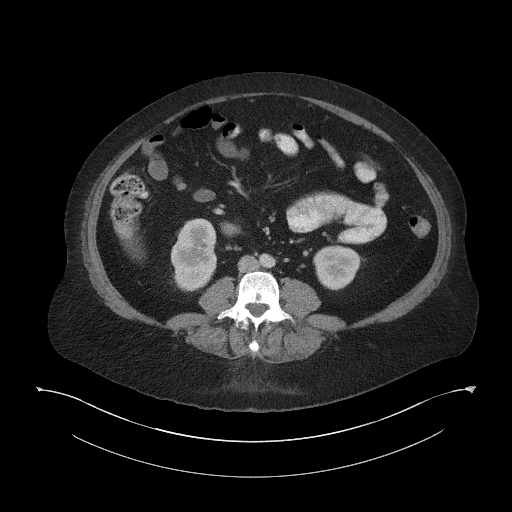
[im 63/94  soft-tissue]
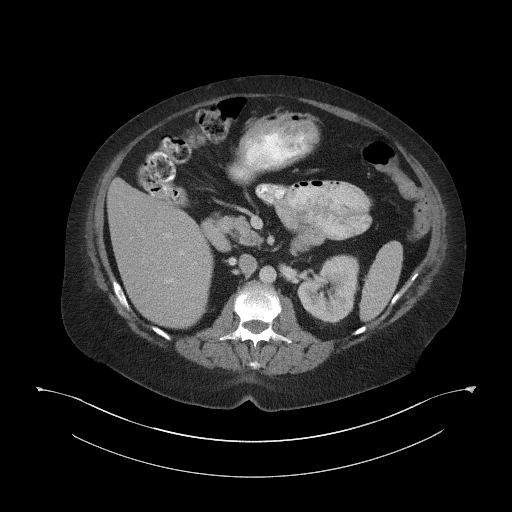
[im 63/94  bone]
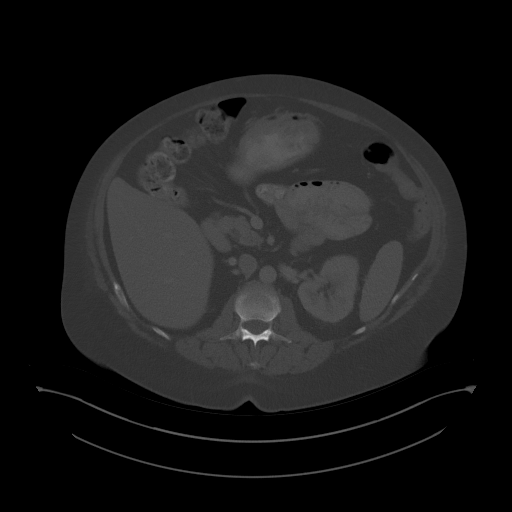
[im 68/94  soft-tissue]
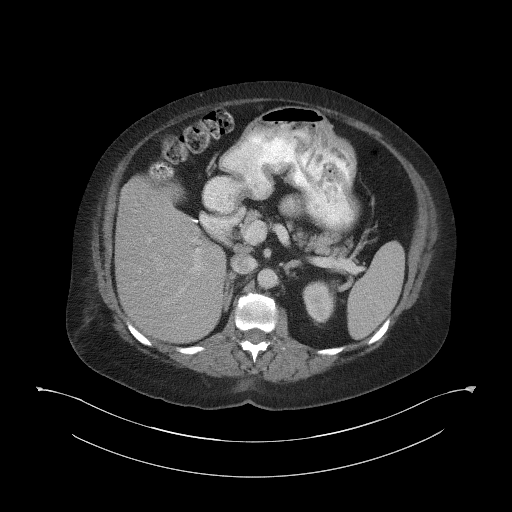
[im 73/94  soft-tissue]
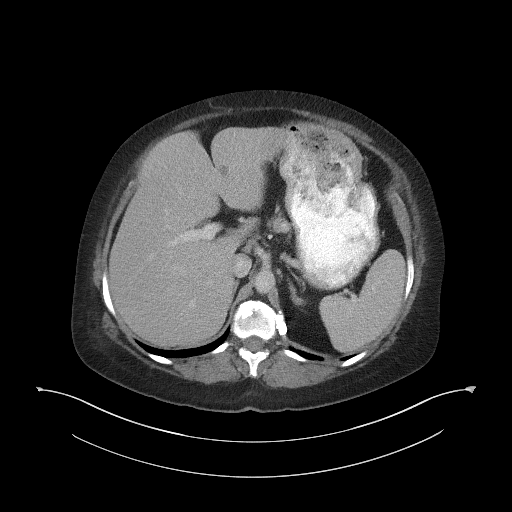
[im 83/94  soft-tissue]
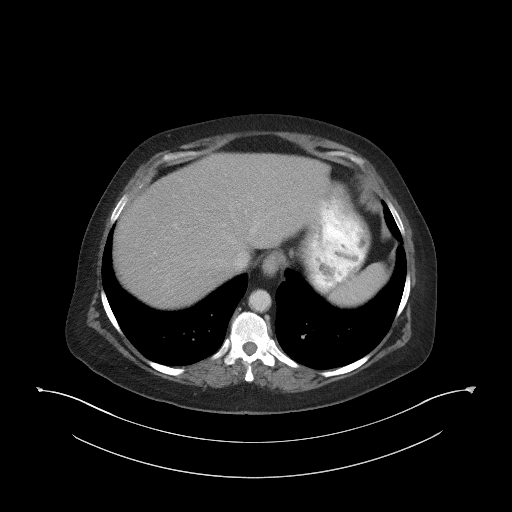
[im 88/94  soft-tissue]
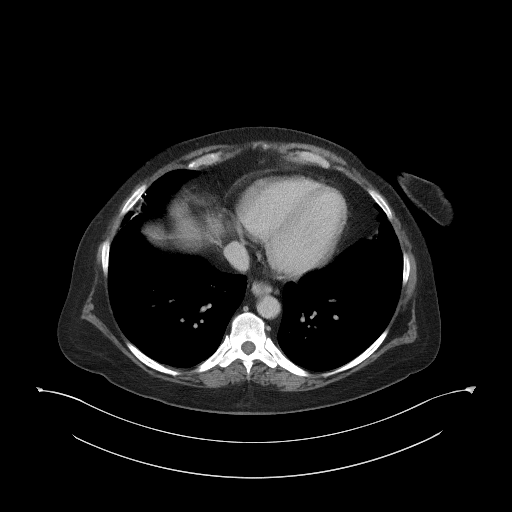

[Series 5: coronal st · coronal · 0.85mm/px · 3 of 111 slices shown]
[im 37/111  soft-tissue]
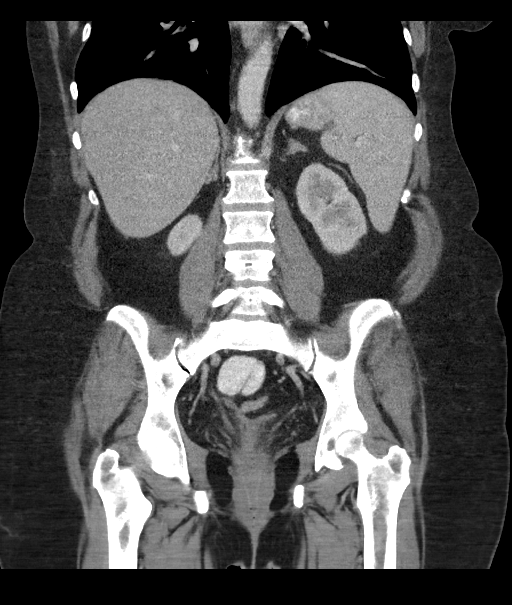
[im 49/111  soft-tissue]
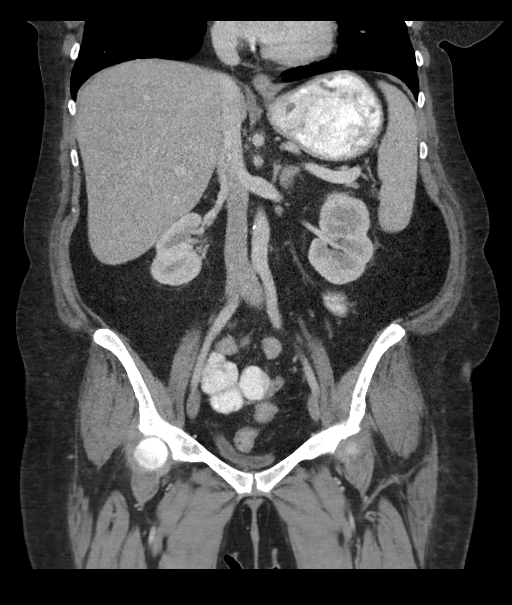
[im 62/111  soft-tissue]
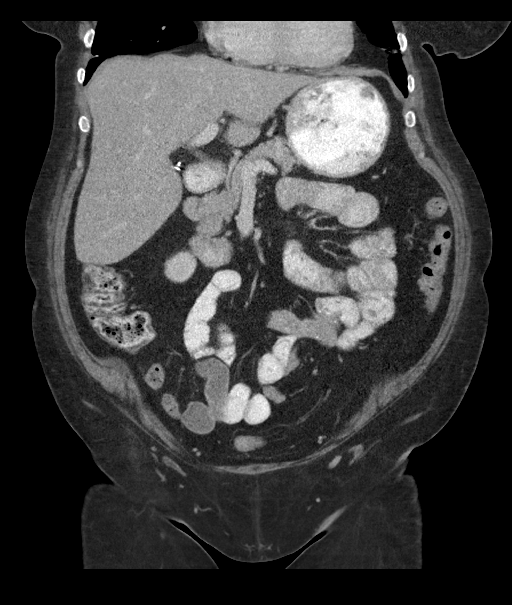

[16 of 46 positions shown; findings below may reference images not displayed]

RADIATION DOSE REDUCTION: This exam was performed according to the
departmental dose-optimization program which includes automated
exposure control, adjustment of the mA and/or kV according to
patient size and/or use of iterative reconstruction technique.

CONTRAST:  100mL OMNIPAQUE IOHEXOL 300 MG/ML  SOLN
FINDINGS: Lower Chest: No acute findings.

Hepatobiliary: No hepatic masses identified. Focal fatty
infiltration seen adjacent to the falciform ligament. Prior
cholecystectomy. No evidence of biliary obstruction.

Pancreas:  No mass or inflammatory changes.

Spleen: Within normal limits in size and appearance.

Adrenals/Urinary Tract: No masses identified. No evidence of
ureteral calculi or hydronephrosis.

Stomach/Bowel: No evidence of obstruction, inflammatory process or
abnormal fluid collections.

Vascular/Lymphatic: No pathologically enlarged lymph nodes. No acute
vascular findings.

Reproductive: Prior hysterectomy noted. Adnexal regions are
unremarkable in appearance.

Other:  None.

Musculoskeletal: No suspicious bone lesions identified. Old
compression deformity with prior vertebroplasty noted at L1.
IMPRESSION: No acute findings or other significant abnormality.

## 2023-05-03 ENCOUNTER — Other Ambulatory Visit: Payer: Self-pay | Admitting: Gastroenterology

## 2024-09-11 ENCOUNTER — Telehealth: Payer: Self-pay | Admitting: Gastroenterology

## 2024-09-11 NOTE — Telephone Encounter (Signed)
 Inbound call from patient stating she was discharged from the hospital for Ileus. She states she was in  ICU for 4 days. Patient was scheduled for OV with Deanna May on 12/22 at 3:00  and is requesting a call back to discuss and to see if she can be seen with Dr. Charlanne sooner. Please advise.

## 2024-09-11 NOTE — Telephone Encounter (Signed)
 Spoke with patient.  Scheduled patient w/ Dr Charlanne 11/12 @ 8:50.  Patient confirmed date and time.

## 2024-09-18 ENCOUNTER — Ambulatory Visit: Admitting: Gastroenterology

## 2024-10-28 ENCOUNTER — Ambulatory Visit: Admitting: Gastroenterology

## 2024-11-26 ENCOUNTER — Encounter: Payer: Self-pay | Admitting: Gastroenterology

## 2024-11-26 ENCOUNTER — Ambulatory Visit: Admitting: Gastroenterology

## 2024-11-26 VITALS — BP 94/60 | HR 110 | Ht 62.0 in | Wt 249.2 lb

## 2024-11-26 DIAGNOSIS — K581 Irritable bowel syndrome with constipation: Secondary | ICD-10-CM

## 2024-11-26 DIAGNOSIS — R1319 Other dysphagia: Secondary | ICD-10-CM | POA: Diagnosis not present

## 2024-11-26 DIAGNOSIS — K760 Fatty (change of) liver, not elsewhere classified: Secondary | ICD-10-CM | POA: Diagnosis not present

## 2024-11-26 DIAGNOSIS — K625 Hemorrhage of anus and rectum: Secondary | ICD-10-CM

## 2024-11-26 DIAGNOSIS — K648 Other hemorrhoids: Secondary | ICD-10-CM

## 2024-11-26 DIAGNOSIS — R110 Nausea: Secondary | ICD-10-CM

## 2024-11-26 DIAGNOSIS — K219 Gastro-esophageal reflux disease without esophagitis: Secondary | ICD-10-CM | POA: Diagnosis not present

## 2024-11-26 DIAGNOSIS — Z860100 Personal history of colon polyps, unspecified: Secondary | ICD-10-CM | POA: Diagnosis not present

## 2024-11-26 DIAGNOSIS — Z8719 Personal history of other diseases of the digestive system: Secondary | ICD-10-CM

## 2024-11-26 DIAGNOSIS — K644 Residual hemorrhoidal skin tags: Secondary | ICD-10-CM | POA: Diagnosis not present

## 2024-11-26 DIAGNOSIS — L290 Pruritus ani: Secondary | ICD-10-CM | POA: Diagnosis not present

## 2024-11-26 MED ORDER — ONDANSETRON 4 MG PO TBDP: 4.0000 mg | ORAL_TABLET | Freq: Two times a day (BID) | ORAL | 1 refills | Status: AC | PRN

## 2024-11-26 NOTE — Patient Instructions (Addendum)
 We will call you to schedule your endoscopy/colonoscopy with Dr Charlanne once his April hospital schedule has opened.   We placed a referral to [facility] for hemorrhoids and their office will contact you to schedule an appointment. If you havent heard from them within 7 business days please call their office at  [phone].  History of adynamic ileus -Minimize Opioids -recommend low residue diet  GERD -Continue omeprazole -Continue pepcid  -Recommend GERD diet  Nausea -Zofran  as needed -small meals spread throughout the day  External/internal hemorrhoids -Refer to colon rectal surgeon  -Sitz baths as needed

## 2024-11-26 NOTE — Progress Notes (Signed)
 "  Chief Complaint:hospital follow-up Primary GI Doctor:Dr. Charlanne  HPI:  Patient is a  49  year old female/female patient with past medical history of HTN, HLD, asthma, COPD, LD, chronic respiratory failure on 2-3 L nasal cannula at baseline, RA, diabetes, neuropathy, chronic pain, who was self referred to me for a evaluation of hospital follow-up .    Patient presented to [facility] ED 08/28/2024 after witnessed fall and several days of worsening encephalopathy, vomiting and diarrhea. Patient was transferred to [facility] for CT scan evaluation. CT chest/abd/pelvis showed chronic lung changes, adynamic ileus. Admitted to ICU for further care of circulatory shock and adynamic ileus.  Patient was found to be hypertensive with systolic blood pressure 50s to 60s improved after 2 L of IV fluids.  Patient was also given Narcan due to concern for hypoxia on home 2 L history of AMS, opioid pain medication at home.  Initial labs were concerning for elevated WBC, lactic acid.  Sepsis protocol was initiated, empiric antibiotic therapy with Zosyn and vancomycin.  No UTI, normal chest x-ray. Of note patient takes Mounjaro (initiated in January; Dose increase in June). She is also on chronic opioids and home meds include Linzess.   10/30/24 seen by cardiology, reviewed entire note.   Interval History Patient last seen in GI office on 02/15/2022 by Dr. Charlanne.  Patient presents for hospital follow-up. Accompanied by her husband.   Patient was admitted back in October for witnessed fall, vomiting, and diarrhea. CTAP showed adynamic ileus. Admitted to ICU for further care of ciculatory shock, hypotension, and adynamic ileus. The adynamic ileus was suspected to be due to her home regimen of medications including chronic opioids along with Mounjaro.  She tells me today her Mounjaro was discontinued.  Patient initially had NG tube placed for bowel decompression and slowly progressed to regular diet. PT  reports no surgical consult was needed.   History of abdominal surgeries- gall bladder, appendectomy Denies  previous history of SBO.  Patient's Mounjaro was discontinued. She was started on it Jan 2025 and lost about 35lbs. Previously on Ozempic which she did not tolerate.  Patient denies altered bowel habits, abdominal pain. Patient taking Linzess 290 mcg po daily for constipation.  Patient complains her internal/external hemorrhoids are causing problems with bleeding and itching. Not improved with topicals or suppositories and enquires about surgical intervention. Patient on oxycodone  three times daily.   Patient taking Omeprazole 20 mg po daily and Pepcid  20 mg in the evening for GERD. She has occasional breakthrough reflux and dysphagia with solids.  Patient also takes Zofran  prn as needed for chronic nausea. She reports she does not take very often.  Patient has history of COPD and on N/C via 2 L.  Denies alcohol use. Nonsmoker.   Not on blood thinners.  Patient's family history includes: no esophageal CA, no colon CA  Wt Readings from Last 3 Encounters:  11/26/24 249 lb 4 oz (113.1 kg)  03/07/22 198 lb (89.8 kg)  02/15/22 188 lb (85.3 kg)    Past Medical History:  Diagnosis Date   Anxiety and depression    Bipolar disorder (HCC)    Cervical cancer (HCC)    Chronic respiratory failure (HCC)    COPD (chronic obstructive pulmonary disease) (HCC)    Diabetes mellitus without complication (HCC)    Diverticulitis    Dyspnea    Elevated LFTs    Fibromyalgia    GERD (gastroesophageal reflux disease)    Hepatic steatosis  History of kidney stones    Hyperlipemia    Hypertension    Ileus (HCC)    Osteopenia    Oxygen  dependent    RHA (rheumatoid arthritis) (HCC)     Past Surgical History:  Procedure Laterality Date   APPENDECTOMY     BACK SURGERY  07/2018   BREAST LUMPECTOMY Left    Benign   CHOLECYSTECTOMY     COLONOSCOPY WITH PROPOFOL  N/A 03/07/2022    Procedure: COLONOSCOPY WITH PROPOFOL ;  Surgeon: Wilhelmenia Aloha Raddle., MD;  Location: [facility];  Service: Gastroenterology;  Laterality: N/A;   ENDOSCOPIC MUCOSAL RESECTION N/A 03/07/2022   Procedure: ENDOSCOPIC MUCOSAL RESECTION;  Surgeon: Wilhelmenia Aloha Raddle., MD;  Location: [facility];  Service: Gastroenterology;  Laterality: N/A;   ESOPHAGOGASTRODUODENOSCOPY  02/25/2016   Small hiatal hernia. Retained food in the stomach- no evidence of gastric outlet obstruction.    EUS  03/07/2022   Procedure: LOWER ENDOSCOPIC ULTRASOUND (EUS);  Surgeon: Wilhelmenia Aloha Raddle., MD;  Location: [facility];  Service: Gastroenterology;;   FINGER SURGERY  03/2021   right index   HEMOSTASIS CLIP PLACEMENT  03/07/2022   Procedure: HEMOSTASIS CLIP PLACEMENT;  Surgeon: Wilhelmenia Aloha Raddle., MD;  Location: [facility];  Service: Gastroenterology;;   INTERCOSTAL NERVE BLOCK Right 10/15/2021   Procedure: INTERCOSTAL NERVE BLOCK;  Surgeon: Kerrin Elspeth BROCKS, MD;  Location: [facility] OR;  Service: Thoracic;  Laterality: Right;   LUNG BIOPSY Right 10/15/2021   Procedure: LUNG BIOPSY;  Surgeon: Kerrin Elspeth BROCKS, MD;  Location: [facility] OR;  Service: Thoracic;  Laterality: Right;   NASAL SEPTUM SURGERY  2021   SUBMUCOSAL LIFTING INJECTION  03/07/2022   Procedure: SUBMUCOSAL LIFTING INJECTION;  Surgeon: Wilhelmenia Aloha Raddle., MD;  Location: [facility];  Service: Gastroenterology;;   TOTAL ABDOMINAL HYSTERECTOMY W/ BILATERAL SALPINGOOPHORECTOMY     Cervical Cancer    Current Outpatient Medications  Medication Sig Dispense Refill   meloxicam (MOBIC) 15 MG tablet Take 15 mg by mouth daily.     miconazole (MICOTIN) 2 % cream Apply 1 Application topically 2 (two) times daily.     montelukast (SINGULAIR) 10 MG tablet Take 10 mg by mouth as needed.     naloxone (NARCAN) nasal spray 4 mg/0.1 mL Place 1 spray into the nose once.     nystatin cream (MYCOSTATIN) Apply 1 Application topically 2 (two) times daily.      omeprazole (PRILOSEC) 20 MG capsule Take 20 mg by mouth daily.     Spacer/Aero-Holding Chambers (AEROCHAMBER Z-STAT PLUS) inhaler 1 each by Other route as needed.     acetaminophen  (TYLENOL ) 500 MG tablet Take 1,000 mg by mouth every 6 (six) hours as needed for mild pain or moderate pain.     albuterol  (VENTOLIN  HFA) 108 (90 Base) MCG/ACT inhaler Inhale 2 puffs into the lungs every 6 (six) hours as needed for wheezing or shortness of breath.     Atogepant (QULIPTA) 60 MG TABS Take 60 mg by mouth daily as needed (Migraine).     benzonatate (TESSALON) 100 MG capsule Take 100-200 mg by mouth 3 (three) times daily as needed for cough.     BREZTRI AEROSPHERE 160-9-4.8 MCG/ACT AERO inhaler Inhale 2 puffs into the lungs as needed.     cyclobenzaprine  (FLEXERIL ) 10 MG tablet Take 10 mg by mouth 2 (two) times daily.     Eszopiclone 3 MG TABS Take 3 mg by mouth at bedtime. Lunesta     famotidine  (PEPCID ) 20 MG tablet Take 20 mg by mouth 2 (  two) times daily as needed for heartburn.     fluvoxaMINE  (LUVOX ) 100 MG tablet Take 100 mg by mouth 2 (two) times daily.     furosemide  (LASIX ) 40 MG tablet Take 40 mg by mouth daily as needed for fluid or edema.     gabapentin  (NEURONTIN ) 800 MG tablet Take 800 mg by mouth 3 (three) times daily.     hydrOXYzine (ATARAX) 50 MG tablet Take 50-100 mg by mouth at bedtime as needed.     hyoscyamine  (LEVSIN  SL) 0.125 MG SL tablet TAKE 1 TABLET (0.125 MG TOTAL) BY MOUTH EVERY 6 (SIX) HOURS AS NEEDED. 360 tablet 1   indomethacin (INDOCIN) 25 MG capsule Take 25 mg by mouth 3 (three) times daily as needed for moderate pain.     ipratropium-albuterol  (DUONEB) 0.5-2.5 (3) MG/3ML SOLN SMARTSIG:1 Unit(s) Every 4 Hours PRN     levocetirizine (XYZAL) 5 MG tablet Take 5 mg by mouth daily.     LINZESS 290 MCG CAPS capsule Take 290 mcg by mouth daily.     ondansetron  (ZOFRAN -ODT) 4 MG disintegrating tablet Take 1 tablet (4 mg total) by mouth every 12 (twelve) hours as needed for nausea or  vomiting. Please call [phone] to for more refills 30 tablet 1   Oxcarbazepine  (TRILEPTAL ) 300 MG tablet Take 300 mg by mouth in the morning, at noon, and at bedtime.     oxyCODONE  (ROXICODONE ) 15 MG immediate release tablet Take 15 mg by mouth 4 (four) times daily as needed.     oxyCODONE -acetaminophen  (PERCOCET) 10-325 MG tablet Take 1 tablet by mouth 4 (four) times daily as needed for pain.     pantoprazole  (PROTONIX ) 40 MG tablet Take 1 tablet (40 mg total) by mouth daily. 90 tablet 3   prazosin  (MINIPRESS ) 5 MG capsule Take 5 mg by mouth at bedtime.     Rimegepant Sulfate (NURTEC) 75 MG TBDP Take 75 mg by mouth as needed.     risperiDONE  (RISPERDAL ) 0.25 MG tablet Take 0.25 mg by mouth 2 (two) times daily.     rizatriptan (MAXALT) 5 MG tablet Take 5 mg by mouth as needed for migraine. Elishua Radford repeat in 2 hours if needed     rosuvastatin  (CRESTOR ) 10 MG tablet Take 10 mg by mouth at bedtime.     Semaglutide,0.25 or 0.5MG /DOS, (OZEMPIC, 0.25 OR 0.5 MG/DOSE,) 2 MG/1.5ML SOPN Inject 0.5 mg into the skin every Friday.     sulfaSALAzine  (AZULFIDINE ) 500 MG tablet Take 500 mg by mouth 3 (three) times daily.     SUMAtriptan (IMITREX) 100 MG tablet Take 100 mg by mouth every 2 (two) hours as needed for migraine.     telmisartan (MICARDIS) 40 MG tablet Take 40 mg by mouth daily.     Tiotropium Bromide Monohydrate  (SPIRIVA  RESPIMAT) 2.5 MCG/ACT AERS INHALE 2 PUFFS BY MOUTH INTO THE LUNGS DAILY 4 g 5   Ubrogepant (UBRELVY) 100 MG TABS Take 100 mg by mouth daily as needed (Migraines).     No current facility-administered medications for this visit.    Allergies as of 11/26/2024 - Review Complete 11/26/2024  Allergen Reaction Noted   Adalimumab Other (See Comments) 04/09/2015   Baclofen Other (See Comments) 08/21/2018   Etanercept Other (See Comments) 10/27/2017   Plaquenil [hydroxychloroquine]  05/22/2018   Topiramate Other (See Comments) 10/25/2016   Armodafinil Palpitations 05/22/2018     Family History  Problem Relation Age of Onset   Thyroid  disease Mother    Hypertension Mother    Diabetes  Mother    Other Father        Polio, encephalitis   Colon cancer Neg Hx    Stomach cancer Neg Hx    Rectal cancer Neg Hx    Esophageal cancer Neg Hx    Inflammatory bowel disease Neg Hx    Liver disease Neg Hx    Pancreatic cancer Neg Hx     Review of Systems:    Constitutional: No weight loss, fever, chills, weakness or fatigue HEENT: Eyes: No change in vision               Ears, Nose, Throat:  No change in hearing or congestion Skin: No rash or itching Cardiovascular: No chest pain, chest pressure or palpitations   Respiratory: No SOB or cough Gastrointestinal: See HPI and otherwise negative Genitourinary: No dysuria or change in urinary frequency Neurological: No headache, dizziness or syncope Musculoskeletal: No new muscle or joint pain Hematologic: No bleeding or bruising Psychiatric: No history of depression or anxiety    Physical Exam:  Vital signs: BP 94/60 (BP Location: Left Arm, Patient Position: Sitting, Cuff Size: Large)   Pulse (!) 110   Ht 5' 2 (1.575 m)   Wt 249 lb 4 oz (113.1 kg)   SpO2 97%   PF (!) 2 L/min   BMI 45.59 kg/m   Constitutional:  Pleasant female appears to be in NAD, Well developed, Well nourished, alert and cooperative Eyes:   PEERL, EOMI. No icterus. Conjunctiva pink. Neck:  Supple Throat: Oral cavity and pharynx without inflammation, swelling or lesion.  Respiratory: Respirations even and unlabored. Lungs clear to auscultation bilaterally.   No wheezes, crackles, or rhonchi. On N/C 2L portable oxygen . Cardiovascular: Normal S1, S2. Regular rate and rhythm. No peripheral edema, cyanosis or pallor.  Gastrointestinal:  Soft,obese, nondistended, nontender. No rebound or guarding. Hypoactive bowel sounds. No appreciable masses or hepatomegaly. Rectal:  Not performed.  Msk:  Symmetrical without gross deformities. Without edema, no  deformity or joint abnormality.  Neurologic:  Alert and  oriented x4;  grossly normal neurologically.  Skin:   Dry and intact without significant lesions or rashes.  RELEVANT LABS AND IMAGING: CBC    Latest Ref Rng & Units 01/06/2022    4:52 PM 10/17/2021   12:38 AM 10/16/2021   12:50 AM  CBC  WBC 4.0 - 10.5 K/uL 13.2  11.1  12.3   Hemoglobin 12.0 - 15.0 g/dL 84.8  88.7  87.3   Hematocrit 36.0 - 46.0 % 46.3  35.2  39.7   Platelets 150.0 - 400.0 K/uL 316.0  169  197      CMP     Latest Ref Rng & Units 01/06/2022    4:52 PM 10/17/2021   12:38 AM 10/16/2021   12:50 AM  CMP  Glucose 70 - 99 mg/dL 889  92  895   BUN 6 - 23 mg/dL 7  12  8    Creatinine 0.40 - 1.20 mg/dL 9.05  9.31  9.32   Sodium 135 - 145 mEq/L 135  123  123   Potassium 3.5 - 5.1 mEq/L 3.2  3.9  4.9   Chloride 96 - 112 mEq/L 95  89  88   CO2 19 - 32 mEq/L 22  23  26    Calcium  8.4 - 10.5 mg/dL 9.0  8.0  8.0   Total Protein 6.0 - 8.3 g/dL 7.0  5.3    Total Bilirubin 0.2 - 1.2 mg/dL 0.3  0.2    Alkaline  Phos 39 - 117 U/L 119  83    AST 0 - 37 U/L 11  16    ALT 0 - 35 U/L 6  7       Lab Results  Component Value Date   TSH 2.62 01/06/2022  10/22 echo- Left ventricular ejection fraction, by estimation, is 60 to 65%.   Past GI work-up:  CTAP 08/28/2024 Impression Fluid and gas tests distention of small and large bowel suggestive of adynamic ileus.  He develops small bowel obstruction is felt to be less likely though cannot be entirely excluded. No specific acute and transthoracic abnormalities.  03/2022 Lower EUS of a polypoid lesion on top of a SEL with attempt at better defining SEL with Dr. Wilhelmenia  Impression:   COLON: - Perianal skin tags found on perianal exam. Hemorrhoids found on digital rectal exam.  - Tortuous colon.  - Subepithelial lesion in the ascending colon  - evaluated by EUS as noted below. - One 25 mm polypoid appearing lesion, on top of the SEL was noted on the ascending colon, removed  with piecemeal mucosal resection. Resected and retrieved. Clips ( MR conditional) were placed ( though complete closure not possible due to the nature of the SEL) .  - Granularity in the recto- sigmoid colon and in the sigmoid colon. Biopsied.  - Normal mucosa in the entire examined colon otherwise.  - Non- bleeding non- thrombosed external and internal hemorrhoids.  EUS Impression:  - An intramural ( subepithelial) lesion was visualized endosonographically in the ascending colon. Sonographically, the origin appeared to be within the deep mucosa ( Layer 2) and submucosa ( Layer 3) . Tissue has not been obtained. However, the endosonographic appearance is consistent with a lipoma. The MP is free of this lesion, so not a GIST/ Leiomyoma based on typical EUS findings.  If there is evidence of SSP/ TA on the pathology, then recommend repeat colonoscopy in 1- year. If no evidence of precancerous tissue, recommend a 3- year followup at maximum to ensure nothing else has developed.  Path: FINAL MICROSCOPIC DIAGNOSIS:  A. COLON, ASCENDING, EMR, BIOPSY: Nonspecific inflammation in fragments of colonic mucosa with a minute portion of submucosal lipoma in one fragment. Negative for inflammatory bowel disease and malignancy.  B. COLON, LEFT, BIOPSY: Benign superficial ulcer in fragments of colonic mucosa with moderate vascular ectasia in the immediate vicinity. Negative for inflammatory bowel disease and neoplasm. Please see comment.  Comment: The colonoscopy report is reviewed.  There are no features of a polyp in the resected specimen from ascending colon.  The submucosal lesion identified most likely represents a lipoma.  However, only a minute fragment of adipose tissue is present in one of the biopsied fragments.  The ulcer identified in the biopsies of descending colon most likely represents NSAID induced ulcer.  There are no features of chronic colitis.   CTAP 01/2022  IMPRESSION: No  acute findings or other significant abnormality.   Colon 07/01/2021 - Medium-sized lipoma/lesion in the distal ascending colon. Bx- SSA.  Scheduled for EMR with Dr. Wilhelmenia. - Moderate right colonic diverticulosis - Non-bleeding internal hemorrhoids. - The examined portion of the ileum was normal. - The examination was otherwise normal on direct   EGD 07/01/2021 - Small transient hiatal hernia. - Gastritis. Bx- neg for HP   CT Abdo/pelvis with contrast 01/2022 -No acute abnormalities -Focal fatty liver.   -Acute diverticulitis- Adm to [facility] December 2019. After D/C, was readmitted to [facility] with ileus.  She  had low potassium and low magnesium.  Managed conservatively by IV fluids, replacing electrolytes, minimizing pain medications and ambulating.  Did not get NG tube.   -EGD 05/2018: small HH, retained food in the stomach without any gastric outlet obstruction.  Neg SB Bx for celiac in past.   -CT AP 10/2018 No acute processes in abdomen/pelvis Fatty liver L1 compression deformity.    Assessment/Plan: Encounter Diagnoses  Name Primary?   History of ileus Yes   Irritable bowel syndrome with constipation    Internal and external bleeding hemorrhoids    Gastroesophageal reflux disease, unspecified whether esophagitis present    Esophageal dysphagia    Nausea without vomiting    History of colonic polyps      49 year old female patient who presents for hospital follow-up from October 2025 where CT scan showed adynamic ileus.  Patient has history of laparoscopic cholecystectomy and appendectomy.  Patient also has history of chronic pain and on daily opioids.  At the time she was also on GLP-1 for weight loss Mounjaro which was suspected to be the potential cause of the ileus therefore it was discontinued.  Patient has continued today Linzess 290 mcg p.o. daily for constipation.  Encouraged a low residue diet minus narcotic use. She complains today of issues with external/internal  hemorrhoids would like surgical referral was sent today.  Patient with history of colonic polyps, due for colon screening colonoscopy go-ahead and schedule with 2-day prep in hospital with Dr. Charlanne.      Patient also has history of GERD with intermittent episodes of pyrosis and regurgitation.  Patient also complains of esophageal dysphagia and chronic nausea.  Will go ahead and proceed with upper GI endoscopy in hospital with Dr. Charlanne to evaluate and rule out peptic ulcer disease, stricture, and/or Barrett's.  #1 Adynamic ileus -Recommend low residue diet -minimize opioids #2 GERD with small HH #3 Esophageal dysphagia #4 Chronic nausea  -Continue omeprazole 20 mg po daily  -Continue Pepcid  20 mg po daily -continue antiemetics prn -Recommend GERD diet -Schedule EGD with possible dilatation in hospital with Dr. Charlanne. The risks and benefits of EGD with possible biopsies and esophageal dilation were discussed with the patient who agrees to proceed. #5 IBS, prominently constipation, on chronic opioids -Continue Linzess 290 mcg po daily  #6 External/internal hemorrhoids #7 pruritus ani  #8 rectal bleeding -surgical referral to [facility] #9 Fatty liver , LFTs normal in November. Neg CTAP 10/25. Lost 35lbs on GLP-1 #10 Chronic pain syndrome #11 Ascending colon deformity found on endoscopy; subepithelial tumor vs extrinsic compression vs lipoma, no evidence of precancerous tissue 5/23 - recommend a 3- year follow up colonoscopy at hospital to ensure nothing else has developed (03/2025) -Schedule for a colonoscopy in hospital with Dr. Charlanne. The risks and benefits of colonoscopy with possible polypectomy / biopsies were discussed and the patient agrees to proceed.  #12 COPD -Patient wears 2-3L oxygen  baseline    Thank you for the courtesy of this consult. Please call me with any questions or concerns.   Fia Hebert, FNP-C [facility] 11/26/2024, 2:44 PM  Cc: Siganporia, Arnaz, FNP  "

## 2024-12-03 ENCOUNTER — Telehealth: Payer: Self-pay | Admitting: Gastroenterology

## 2024-12-03 SURGERY — COLONOSCOPY
Anesthesia: Monitor Anesthesia Care

## 2024-12-03 NOTE — Telephone Encounter (Signed)
 Inbound call from patient stating she has not heard anything in regards to [facility] referral. Requesting a call back. Please advise, thank you

## 2024-12-04 NOTE — Telephone Encounter (Signed)
 Phone call returned and phone number given to contact [facility]. Patient verbalized understanding and all questions were answered.

## 2024-12-13 ENCOUNTER — Other Ambulatory Visit: Payer: Self-pay

## 2024-12-13 ENCOUNTER — Telehealth: Payer: Self-pay

## 2024-12-13 DIAGNOSIS — R1319 Other dysphagia: Secondary | ICD-10-CM

## 2024-12-13 DIAGNOSIS — K219 Gastro-esophageal reflux disease without esophagitis: Secondary | ICD-10-CM

## 2024-12-13 DIAGNOSIS — Z860100 Personal history of colon polyps, unspecified: Secondary | ICD-10-CM

## 2024-12-13 MED ORDER — NA SULFATE-K SULFATE-MG SULF 17.5-3.13-1.6 GM/177ML PO SOLN: 1.0000 | Freq: Once | ORAL | 0 refills | Status: AC

## 2024-12-13 NOTE — Telephone Encounter (Signed)
 Spoke with Lacey Erickson to schedule ECL @ [facility] w/ Dr Charlanne. Procedure scheduled.   However, she states she is having an insurance issue with her hemorrhoid surgery. We sent a referral to [facility] for the procedure and she states insurance needs more information in order to cover the surgery. She isn't sure what exactly they need. She states her PCP has helped with what they could. She asked if we could reach out to her insurance company and help with this. Please advise.

## 2025-02-11 ENCOUNTER — Encounter (HOSPITAL_COMMUNITY): Payer: Self-pay

## 2025-02-11 ENCOUNTER — Ambulatory Visit (HOSPITAL_COMMUNITY): Admit: 2025-02-11 | Admitting: Gastroenterology
